# Patient Record
Sex: Female | Born: 1935
Health system: Southern US, Community
[De-identification: ages and names within clinical notes are randomized; demographics above are authoritative.]

## PROBLEM LIST (undated history)

## (undated) DIAGNOSIS — Y92009 Unspecified place in unspecified non-institutional (private) residence as the place of occurrence of the external cause: Secondary | ICD-10-CM

## (undated) DIAGNOSIS — I219 Acute myocardial infarction, unspecified: Secondary | ICD-10-CM

## (undated) DIAGNOSIS — I10 Essential (primary) hypertension: Secondary | ICD-10-CM

## (undated) DIAGNOSIS — W19XXXA Unspecified fall, initial encounter: Secondary | ICD-10-CM

## (undated) DIAGNOSIS — F329 Major depressive disorder, single episode, unspecified: Secondary | ICD-10-CM

## (undated) DIAGNOSIS — F32A Depression, unspecified: Secondary | ICD-10-CM

## (undated) HISTORY — DX: Major depressive disorder, single episode, unspecified: F32.9

## (undated) HISTORY — PX: ELECTROCONVULSIVE THERAPY: SHX1495

## (undated) HISTORY — DX: Depression, unspecified: F32.A

## (undated) HISTORY — DX: Acute myocardial infarction, unspecified: I21.9

## (undated) HISTORY — DX: Essential (primary) hypertension: I10

## (undated) HISTORY — PX: BACK SURGERY: SHX140

---

## 1985-03-19 DIAGNOSIS — I219 Acute myocardial infarction, unspecified: Secondary | ICD-10-CM

## 1985-03-19 HISTORY — DX: Acute myocardial infarction, unspecified: I21.9

## 1988-09-16 HISTORY — PX: BLADDER SURGERY: SHX569

## 1993-03-19 HISTORY — PX: ABDOMINAL HYSTERECTOMY: SHX81

## 1997-04-19 ENCOUNTER — Encounter (INDEPENDENT_AMBULATORY_CARE_PROVIDER_SITE_OTHER): Payer: Self-pay | Admitting: Internal Medicine

## 1997-04-19 LAB — CONVERTED CEMR LAB

## 1999-08-02 ENCOUNTER — Encounter: Admission: RE | Admit: 1999-08-02 | Discharge: 1999-08-02 | Payer: Self-pay | Admitting: Family Medicine

## 1999-08-02 ENCOUNTER — Encounter: Payer: Self-pay | Admitting: Family Medicine

## 2000-08-02 ENCOUNTER — Encounter: Admission: RE | Admit: 2000-08-02 | Discharge: 2000-08-02 | Payer: Self-pay | Admitting: Family Medicine

## 2000-08-02 ENCOUNTER — Encounter: Payer: Self-pay | Admitting: Family Medicine

## 2001-02-19 ENCOUNTER — Encounter: Payer: Self-pay | Admitting: Orthopedic Surgery

## 2001-02-19 ENCOUNTER — Encounter: Admission: RE | Admit: 2001-02-19 | Discharge: 2001-02-19 | Payer: Self-pay | Admitting: Orthopedic Surgery

## 2001-06-22 ENCOUNTER — Emergency Department (HOSPITAL_COMMUNITY): Admission: EM | Admit: 2001-06-22 | Discharge: 2001-06-22 | Payer: Self-pay

## 2001-08-06 ENCOUNTER — Encounter: Payer: Self-pay | Admitting: Family Medicine

## 2001-08-06 ENCOUNTER — Encounter: Admission: RE | Admit: 2001-08-06 | Discharge: 2001-08-06 | Payer: Self-pay | Admitting: Family Medicine

## 2001-08-08 ENCOUNTER — Encounter: Admission: RE | Admit: 2001-08-08 | Discharge: 2001-08-08 | Payer: Self-pay | Admitting: Family Medicine

## 2001-08-08 ENCOUNTER — Encounter: Payer: Self-pay | Admitting: Family Medicine

## 2001-12-11 ENCOUNTER — Encounter: Payer: Self-pay | Admitting: Neurological Surgery

## 2001-12-11 ENCOUNTER — Encounter: Admission: RE | Admit: 2001-12-11 | Discharge: 2001-12-11 | Payer: Self-pay | Admitting: Neurological Surgery

## 2002-01-06 ENCOUNTER — Ambulatory Visit (HOSPITAL_COMMUNITY): Admission: RE | Admit: 2002-01-06 | Discharge: 2002-01-06 | Payer: Self-pay | Admitting: Neurological Surgery

## 2002-04-13 ENCOUNTER — Inpatient Hospital Stay (HOSPITAL_COMMUNITY): Admission: RE | Admit: 2002-04-13 | Discharge: 2002-04-18 | Payer: Self-pay | Admitting: Neurological Surgery

## 2002-04-13 ENCOUNTER — Encounter: Payer: Self-pay | Admitting: Neurological Surgery

## 2002-08-10 ENCOUNTER — Encounter: Payer: Self-pay | Admitting: Family Medicine

## 2002-08-10 ENCOUNTER — Encounter: Admission: RE | Admit: 2002-08-10 | Discharge: 2002-08-10 | Payer: Self-pay | Admitting: Family Medicine

## 2002-09-12 ENCOUNTER — Emergency Department (HOSPITAL_COMMUNITY): Admission: EM | Admit: 2002-09-12 | Discharge: 2002-09-12 | Payer: Self-pay

## 2002-09-15 ENCOUNTER — Encounter: Payer: Self-pay | Admitting: Emergency Medicine

## 2002-09-15 ENCOUNTER — Inpatient Hospital Stay (HOSPITAL_COMMUNITY): Admission: EM | Admit: 2002-09-15 | Discharge: 2002-09-16 | Payer: Self-pay | Admitting: Emergency Medicine

## 2002-09-18 ENCOUNTER — Encounter: Admission: RE | Admit: 2002-09-18 | Discharge: 2002-09-18 | Payer: Self-pay | Admitting: Family Medicine

## 2002-09-18 ENCOUNTER — Encounter: Payer: Self-pay | Admitting: Family Medicine

## 2002-10-10 ENCOUNTER — Emergency Department (HOSPITAL_COMMUNITY): Admission: EM | Admit: 2002-10-10 | Discharge: 2002-10-10 | Payer: Self-pay

## 2002-11-16 ENCOUNTER — Inpatient Hospital Stay (HOSPITAL_COMMUNITY): Admission: EM | Admit: 2002-11-16 | Discharge: 2002-11-22 | Payer: Self-pay | Admitting: Psychiatry

## 2003-04-23 ENCOUNTER — Encounter: Admission: RE | Admit: 2003-04-23 | Discharge: 2003-04-23 | Payer: Self-pay | Admitting: Neurological Surgery

## 2003-05-09 ENCOUNTER — Inpatient Hospital Stay (HOSPITAL_COMMUNITY): Admission: EM | Admit: 2003-05-09 | Discharge: 2003-05-20 | Payer: Self-pay | Admitting: Psychiatry

## 2003-05-21 ENCOUNTER — Other Ambulatory Visit (HOSPITAL_COMMUNITY): Admission: RE | Admit: 2003-05-21 | Discharge: 2003-05-26 | Payer: Self-pay | Admitting: Psychiatry

## 2003-08-29 ENCOUNTER — Encounter: Admission: RE | Admit: 2003-08-29 | Discharge: 2003-08-29 | Payer: Self-pay | Admitting: Neurological Surgery

## 2003-09-23 ENCOUNTER — Ambulatory Visit (HOSPITAL_COMMUNITY): Admission: RE | Admit: 2003-09-23 | Discharge: 2003-09-23 | Payer: Self-pay | Admitting: Neurological Surgery

## 2004-05-01 ENCOUNTER — Encounter: Admission: RE | Admit: 2004-05-01 | Discharge: 2004-05-01 | Payer: Self-pay | Admitting: Family Medicine

## 2004-05-11 ENCOUNTER — Encounter: Admission: RE | Admit: 2004-05-11 | Discharge: 2004-05-11 | Payer: Self-pay | Admitting: Family Medicine

## 2004-06-09 ENCOUNTER — Ambulatory Visit: Payer: Self-pay | Admitting: Family Medicine

## 2004-06-16 ENCOUNTER — Ambulatory Visit: Payer: Self-pay | Admitting: Family Medicine

## 2004-10-06 ENCOUNTER — Ambulatory Visit: Payer: Self-pay | Admitting: Family Medicine

## 2005-06-29 ENCOUNTER — Encounter: Admission: RE | Admit: 2005-06-29 | Discharge: 2005-06-29 | Payer: Self-pay | Admitting: Family Medicine

## 2005-12-11 ENCOUNTER — Ambulatory Visit: Payer: Self-pay | Admitting: Internal Medicine

## 2006-02-13 ENCOUNTER — Ambulatory Visit: Payer: Self-pay | Admitting: Internal Medicine

## 2006-03-13 ENCOUNTER — Ambulatory Visit: Payer: Self-pay | Admitting: Internal Medicine

## 2006-04-29 ENCOUNTER — Ambulatory Visit: Payer: Self-pay | Admitting: Internal Medicine

## 2006-06-17 ENCOUNTER — Ambulatory Visit: Payer: Self-pay | Admitting: Family Medicine

## 2006-06-17 LAB — CONVERTED CEMR LAB
BUN: 15 mg/dL (ref 6–23)
GFR calc Af Amer: 70 mL/min
Potassium: 4.3 meq/L (ref 3.5–5.1)

## 2006-08-29 ENCOUNTER — Telehealth (INDEPENDENT_AMBULATORY_CARE_PROVIDER_SITE_OTHER): Payer: Self-pay | Admitting: Internal Medicine

## 2006-09-19 ENCOUNTER — Telehealth: Payer: Self-pay | Admitting: Family Medicine

## 2007-04-03 ENCOUNTER — Ambulatory Visit: Payer: Self-pay | Admitting: Family Medicine

## 2007-04-03 DIAGNOSIS — R269 Unspecified abnormalities of gait and mobility: Secondary | ICD-10-CM

## 2007-04-03 DIAGNOSIS — E876 Hypokalemia: Secondary | ICD-10-CM | POA: Insufficient documentation

## 2007-04-08 LAB — CONVERTED CEMR LAB
BUN: 16 mg/dL (ref 6–23)
Calcium: 9.5 mg/dL (ref 8.4–10.5)
GFR calc Af Amer: 57 mL/min
GFR calc non Af Amer: 47 mL/min

## 2007-08-05 ENCOUNTER — Ambulatory Visit: Payer: Self-pay | Admitting: Family Medicine

## 2007-08-05 DIAGNOSIS — G4700 Insomnia, unspecified: Secondary | ICD-10-CM | POA: Insufficient documentation

## 2008-03-17 ENCOUNTER — Telehealth (INDEPENDENT_AMBULATORY_CARE_PROVIDER_SITE_OTHER): Payer: Self-pay | Admitting: Internal Medicine

## 2008-05-24 ENCOUNTER — Telehealth (INDEPENDENT_AMBULATORY_CARE_PROVIDER_SITE_OTHER): Payer: Self-pay | Admitting: Internal Medicine

## 2008-10-01 ENCOUNTER — Encounter (INDEPENDENT_AMBULATORY_CARE_PROVIDER_SITE_OTHER): Payer: Self-pay | Admitting: Internal Medicine

## 2008-10-01 DIAGNOSIS — I252 Old myocardial infarction: Secondary | ICD-10-CM

## 2008-10-01 DIAGNOSIS — I10 Essential (primary) hypertension: Secondary | ICD-10-CM

## 2008-10-01 DIAGNOSIS — F329 Major depressive disorder, single episode, unspecified: Secondary | ICD-10-CM

## 2008-10-01 DIAGNOSIS — G20 Parkinson's disease: Secondary | ICD-10-CM

## 2008-10-06 ENCOUNTER — Ambulatory Visit: Payer: Self-pay | Admitting: Family Medicine

## 2008-10-07 LAB — CONVERTED CEMR LAB
BUN: 18 mg/dL (ref 6–23)
Eosinophils Relative: 1.6 % (ref 0.0–5.0)
GFR calc non Af Amer: 57.42 mL/min (ref >60)
HCT: 49 % — ABNORMAL HIGH (ref 36.0–46.0)
Hemoglobin: 16.7 g/dL — ABNORMAL HIGH (ref 12.0–15.0)
Lymphs Abs: 1.7 10*3/uL (ref 0.7–4.0)
Monocytes Relative: 7.1 % (ref 3.0–12.0)
Platelets: 149 10*3/uL — ABNORMAL LOW (ref 150.0–400.0)
Potassium: 4.1 meq/L (ref 3.5–5.1)
Sodium: 146 meq/L — ABNORMAL HIGH (ref 135–145)
TSH: 2.59 microintl units/mL (ref 0.35–5.50)
WBC: 6.2 10*3/uL (ref 4.5–10.5)

## 2008-10-08 ENCOUNTER — Telehealth (INDEPENDENT_AMBULATORY_CARE_PROVIDER_SITE_OTHER): Payer: Self-pay | Admitting: Internal Medicine

## 2008-10-18 ENCOUNTER — Encounter: Payer: Self-pay | Admitting: Family Medicine

## 2008-10-21 ENCOUNTER — Encounter: Payer: Self-pay | Admitting: Family Medicine

## 2008-11-02 ENCOUNTER — Encounter: Payer: Self-pay | Admitting: Family Medicine

## 2008-11-18 ENCOUNTER — Encounter (INDEPENDENT_AMBULATORY_CARE_PROVIDER_SITE_OTHER): Payer: Self-pay | Admitting: Internal Medicine

## 2008-12-08 ENCOUNTER — Encounter (INDEPENDENT_AMBULATORY_CARE_PROVIDER_SITE_OTHER): Payer: Self-pay | Admitting: Internal Medicine

## 2008-12-15 ENCOUNTER — Encounter: Payer: Self-pay | Admitting: Family Medicine

## 2009-01-18 ENCOUNTER — Encounter: Payer: Self-pay | Admitting: Family Medicine

## 2009-01-27 ENCOUNTER — Encounter (INDEPENDENT_AMBULATORY_CARE_PROVIDER_SITE_OTHER): Payer: Self-pay | Admitting: Internal Medicine

## 2009-02-17 ENCOUNTER — Encounter (INDEPENDENT_AMBULATORY_CARE_PROVIDER_SITE_OTHER): Payer: Self-pay | Admitting: Internal Medicine

## 2009-02-21 ENCOUNTER — Encounter: Payer: Self-pay | Admitting: Family Medicine

## 2009-03-07 ENCOUNTER — Encounter: Payer: Self-pay | Admitting: Family Medicine

## 2009-04-26 ENCOUNTER — Encounter: Payer: Self-pay | Admitting: Family Medicine

## 2009-04-28 ENCOUNTER — Encounter: Payer: Self-pay | Admitting: Family Medicine

## 2009-06-17 ENCOUNTER — Encounter: Payer: Self-pay | Admitting: Family Medicine

## 2009-12-05 ENCOUNTER — Telehealth: Payer: Self-pay | Admitting: Family Medicine

## 2010-02-23 ENCOUNTER — Ambulatory Visit: Payer: Self-pay | Admitting: Family Medicine

## 2010-02-23 DIAGNOSIS — D649 Anemia, unspecified: Secondary | ICD-10-CM | POA: Insufficient documentation

## 2010-02-24 LAB — CONVERTED CEMR LAB
Basophils Relative: 0.6 % (ref 0.0–3.0)
Direct LDL: 133.3 mg/dL
Eosinophils Relative: 2.2 % (ref 0.0–5.0)
GFR calc non Af Amer: 67.18 mL/min (ref >60.00)
Glucose, Bld: 98 mg/dL (ref 70–99)
HCT: 46.4 % — ABNORMAL HIGH (ref 36.0–46.0)
HDL: 61.1 mg/dL (ref >39.00)
Hemoglobin: 15.8 g/dL — ABNORMAL HIGH (ref 12.0–15.0)
Lymphs Abs: 1.8 10*3/uL (ref 0.7–4.0)
Monocytes Relative: 9.7 % (ref 3.0–12.0)
Neutro Abs: 3.2 10*3/uL (ref 1.4–7.7)
Potassium: 4.6 meq/L (ref 3.5–5.1)
RDW: 13.9 % (ref 11.5–14.6)
Sodium: 141 meq/L (ref 135–145)
Total CHOL/HDL Ratio: 4
Triglycerides: 92 mg/dL (ref 0.0–149.0)
VLDL: 18.4 mg/dL (ref 0.0–40.0)
WBC: 5.7 10*3/uL (ref 4.5–10.5)

## 2010-04-04 ENCOUNTER — Ambulatory Visit: Admit: 2010-04-04 | Payer: Self-pay | Admitting: Family Medicine

## 2010-04-08 ENCOUNTER — Encounter: Payer: Self-pay | Admitting: Neurological Surgery

## 2010-04-11 ENCOUNTER — Ambulatory Visit
Admission: RE | Admit: 2010-04-11 | Discharge: 2010-04-11 | Payer: Self-pay | Source: Home / Self Care | Attending: Family Medicine | Admitting: Family Medicine

## 2010-04-11 ENCOUNTER — Encounter: Payer: Self-pay | Admitting: Family Medicine

## 2010-04-11 ENCOUNTER — Other Ambulatory Visit: Payer: Self-pay | Admitting: Family Medicine

## 2010-04-11 DIAGNOSIS — H6120 Impacted cerumen, unspecified ear: Secondary | ICD-10-CM | POA: Insufficient documentation

## 2010-04-11 DIAGNOSIS — Z1239 Encounter for other screening for malignant neoplasm of breast: Secondary | ICD-10-CM

## 2010-04-11 DIAGNOSIS — H9190 Unspecified hearing loss, unspecified ear: Secondary | ICD-10-CM | POA: Insufficient documentation

## 2010-04-18 NOTE — Miscellaneous (Signed)
Summary: [facility] Episode Summary Report  [facility] Episode Summary Report   Imported By: Beau Fanny 06/21/2009 14:45:27  _____________________________________________________________________  External Attachment:    Type:   Image     Comment:   External Document

## 2010-04-18 NOTE — Miscellaneous (Signed)
Summary: [facility] Physical Therapy Order  [facility] Physical Therapy Order   Imported By: Beau Fanny 04/29/2009 10:43:18  _____________________________________________________________________  External Attachment:    Type:   Image     Comment:   External Document

## 2010-04-18 NOTE — Assessment & Plan Note (Signed)
Summary: TRANSFER FROM BILLIE/REFILL MEDS/CLE   Vital Signs:  Patient profile:   77 year old female Temp:     98.7 degrees F oral Pulse rate:   68 / minute Pulse rhythm:   regular BP sitting:   130 / 90  (left arm) Cuff size:   regular  Vitals Entered By: Linde Gillis CMA Duncan Dull) (February 23, 2010 8:58 AM) CC: establish care from [facility] Comments patient didn't feel like weighing   History of Present Illness: 67 you pt new to me here for med refills.   Here with her husband and daughter.    Still doing PT at home.  Dr. Golden Hurter is her neurologist of her Parkinson's.  Requesting higher dose of amitriptyline because it helps her sleep better at night.    HTN- on Atenolol 25 mg two times a day.  No CP, blurred vision, HA or LE edema.  No dizziness with changing positions.  h/o anemia- per pt's family, followed by hematology?  Fatigue is stable.  No SOB or CP. No hematology notes in emr.  Current Medications (verified): 1)  Atenolol 50 Mg Tabs (Atenolol) .... Take 1/2 Tablet By Mouth Twice A Day 2)  Bl Stool Softener 100 Mg  Caps (Docusate Sodium) .... As Needed 3)  Klor-Con M20 20 Meq Cr-Tabs (Potassium Chloride Crys Cr) .... Take One By Mouth Two Times A Day 4)  Multivitamins   Tabs (Multiple Vitamin) .... One Daily 5)  Fish Oil   Oil (Fish Oil) .... One Daily 6)  Amitriptyline Hcl 25 Mg Tabs (Amitriptyline Hcl) .Marland Kitchen.. 1 -2 Tab By Mouth At Bedtime As Needed.  Allergies: 1)  ! * Many Sensitivites  Past History:  Past Medical History: Last updated: 10/01/2008 Depression Hypertension Myocardial infarction, hx of in 1987  Past Surgical History: Last updated: 10/06/2008 back surgery --2003 and 01/1989 tltal hysterectomy--1995 bladder tack up--09/1988  Family History: Last updated: 10/01/2008 Father: MI at age 23 Mother: Angina and MI age 103 and died. Siblings:One sibling - brain tumor                One sibling - Diabetes and hx of MI  Social History: Last updated:  10/06/2008 Marital Status: Married Children:  Occupation:   Risk Factors: Alcohol Use: 0 (10/06/2008) Caffeine Use: 1 (10/06/2008) Exercise: no (10/06/2008)  Risk Factors: Smoking Status: never (10/06/2008)  Review of Systems      See HPI General:  Complains of fatigue. Eyes:  Denies blurring. ENT:  Denies difficulty swallowing. CV:  Denies chest pain or discomfort. Resp:  Denies shortness of breath. GI:  Denies abdominal pain. Derm:  Denies rash. Psych:  Denies anxiety and depression.  Physical Exam  General:  alert, well-developed, well-nourished, and well-hydrated.  NAD sitting quietly in wheel chair Head:  normocephalic, atraumatic, and no abnormalities observed.   Eyes:  pupils equal, pupils round, and no injection.   Ears:  R ear normal and L ear normal.   Nose:  no external deformity.   Mouth:  no gingival abnormalities.   Lungs:  normal respiratory effort, no intercostal retractions, no accessory muscle use, and normal breath sounds.   Heart:  occasional extra beat--no pattern noted, no murmur heard Abdomen:  soft, non-tender, normal bowel sounds, and no guarding.   Msk:  able to raise both arms above her head slowly  Neurologic:  alert & oriented X3,-tremors in both hands at rest, less so with intention Skin:  color normal and no rashes.   Psych:  normally interactive, flat affect, and subdued.     Impression & Recommendations:  Problem # 1:  HYPERTENSION (ICD-401.9) Assessment Unchanged Stable, will check BMET today. Refilled Atenolol. Her updated medication list for this problem includes:    Atenolol 50 Mg Tabs (Atenolol) .Marland Kitchen... Take 1/2 tablet by mouth twice a day  Orders: Venipuncture (82956)  Problem # 2:  PARKINSON'S DISEASE (ICD-332.0) Assessment: Unchanged per neurology.  Problem # 3:  UNSPECIFIED ANEMIA (ICD-285.9) Assessment: Unchanged no hematology notes in emr. Will check CBC, B12, folate. Orders: TLB-CBC Platelet - w/Differential  (85025-CBCD) TLB-B12 + Folate Pnl ([id number]-O96/EXB) Prescription Created Electronically [phone])  Problem # 4:  INSOMNIA (ICD-780.52) Assessment: Deteriorated Increased dose of amitriptyline.    Complete Medication List: 1)  Atenolol 50 Mg Tabs (Atenolol) .... Take 1/2 tablet by mouth twice a day 2)  Bl Stool Softener 100 Mg Caps (Docusate sodium) .... As needed 3)  Klor-con M20 20 Meq Cr-tabs (Potassium chloride crys cr) .... Take one by mouth two times a day 4)  Multivitamins Tabs (Multiple vitamin) .... One daily 5)  Fish Oil Oil (Fish oil) .... One daily 6)  Amitriptyline Hcl 25 Mg Tabs (Amitriptyline hcl) .Marland Kitchen.. 1 -2 tab by mouth at bedtime as needed.  Other Orders: TLB-BMP (Basic Metabolic Panel-BMET) (80048-METABOL) TLB-Lipid Panel (80061-LIPID) Prescriptions: AMITRIPTYLINE HCL 25 MG TABS (AMITRIPTYLINE HCL) 1 -2 tab by mouth at bedtime as needed.  #60 x 0   Entered and Authorized by:   [name] MD   Signed by:   [name] MD on 02/23/2010   Method used:   [facility] [address] [facility]       [address]       [place], [place]  [address]       Ph: [phone]       Fax: [phone]   RxID:   [id number] AMITRIPTYLINE HCL 10 MG TABS (AMITRIPTYLINE HCL) take 2 at bedtime as needed  #60 x 3   Entered and Authorized by:   [name] MD   Signed by:   [name] MD on 02/23/2010   Method used:   [facility]        [facility]  [address] [facility]       [address]       [place], [place]  [address]       Ph: [phone]       Fax: [phone]   RxID:   [id number] KLOR-CON M20 20 MEQ CR-TABS (POTASSIUM CHLORIDE CRYS CR) Take one by mouth two times a day  #60 x 5   Entered and Authorized by:   [name] MD   Signed by:   [name] MD on 02/23/2010   Method used:   [facility] [facility]       [address]       [place], [place]  [address]       Ph: [phone]       Fax: [phone]   RxID:   [id number] ATENOLOL 50 MG TABS (ATENOLOL) Take 1/2 tablet by mouth twice a day  #60 x 4   Entered and Authorized by:   [name] MD   Signed by:   Jovita Gamma  Dayton Martes MD on 02/23/2010   Method used:   [facility]        [facility] [address] [facility]       [address]       [place], [place]  [address]       Ph: [phone]       Fax: [phone]   RxID:   [id number]    Orders Added: 1)  Venipuncture [73220] 2)  TLB-BMP (Basic Metabolic Panel-BMET) [80048-METABOL] 3)  TLB-CBC Platelet - w/Differential [85025-CBCD] 4)  TLB-B12 + Folate Pnl [82746_82607-B12/FOL] 5)  TLB-Lipid Panel [80061-LIPID] 6)  Prescription Created Electronically [G8553] 7)  Est. Patient Level IV [25427]    Current Allergies (reviewed today): ! * MANY SENSITIVITES

## 2010-04-18 NOTE — Miscellaneous (Signed)
Summary: [facility] Episode Summary Report  [facility] Episode Summary Report   Imported By: Beau Fanny 04/29/2009 10:46:20  _____________________________________________________________________  External Attachment:    Type:   Image     Comment:   External Document

## 2010-04-18 NOTE — Progress Notes (Signed)
Summary: klor-con  Phone Note Refill Request Message from:  Scriptline on December 05, 2009 10:35 AM  Refills Requested: Medication #1:  KLOR-CON M20 20 MEQ CR-TABS Take one by mouth two times a day   Supply Requested: 1 month Patient not seen since 11-2008   Method Requested: Electronic Initial call taken by: Benny Lennert CMA Duncan Dull),  December 05, 2009 10:35 AM  Follow-up for Phone Call        px written on EMR for call in set her up with new doctor this fall please - used to see Willaim Sheng and Nadine Counts Follow-up by: Judith Part MD,  December 05, 2009 10:57 AM  Additional Follow-up for Phone Call Additional follow up Details #1::        Patient is changing to [facility].Consuello Masse CMA   Additional Follow-up by: Benny Lennert CMA (AAMA),  December 05, 2009 11:01 AM    New/Updated Medications: KLOR-CON M20 20 MEQ CR-TABS (POTASSIUM CHLORIDE CRYS CR) Take one by mouth two times a day Prescriptions: KLOR-CON M20 20 MEQ CR-TABS (POTASSIUM CHLORIDE CRYS CR) Take one by mouth two times a day  #60 x 5   Entered by:   Benny Lennert CMA (AAMA)   Authorized by:   Judith Part MD   Signed by:   Benny Lennert CMA (AAMA) on 12/05/2009   Method used:   [facility]        [facility] [facility] [address] [facility]       [address]       [place], [place]  [address]       Ph: [phone]       Fax: [phone]   RxID:   [id number]

## 2010-04-18 NOTE — Miscellaneous (Signed)
Summary: [facility] Plan of Care  [facility] Plan of Care   Imported By: Beau Fanny 04/29/2009 10:41:58  _____________________________________________________________________  External Attachment:    Type:   Image     Comment:   External Document

## 2010-04-20 NOTE — Letter (Signed)
Summary: Nature conservation officer [employer] Wellness Visit Questionnaire   [employer] Annual Wellness Visit Questionnaire   Imported By: Beau Fanny 04/11/2010 11:20:51  _____________________________________________________________________  External Attachment:    Type:   Image     Comment:   External Document

## 2010-04-20 NOTE — Assessment & Plan Note (Signed)
Summary: MEDCARE WELLNESS CPX/RBH   Vital Signs:  Patient profile:   77 year old female Height:      64 inches Weight:      131.50 pounds BMI:     22.65 Temp:     97.9 degrees F oral Pulse rate:   60 / minute Pulse rhythm:   regular BP sitting:   140 / 90  (left arm) Cuff size:   regular  Vitals Entered By: Linde Gillis CMA Duncan Dull) (April 11, 2010 9:57 AM) CC: medicare wellness physicial  Vision Screening:      Vision Comments: patient could not read any line completely  Vision Entered By: Linde Gillis CMA Duncan Dull) (April 11, 2010 10:06 AM)  Hearing Screen  20db HL: Left  Right  Audiometry Comment: patient could not hear at any noise level   Hearing Testing Entered By: Linde Gillis CMA Duncan Dull) (April 11, 2010 10:06 AM)   History of Present Illness: I have personally reviewed the Medicare Annual Wellness questionnaire and have noted 1.   The patient's medical and social history 2.   Their use of alcohol, tobacco or illicit drugs- denies any drug, tobacco or ETOH use. 3.   Their current medications and supplements 4.   The patient's functional ability including ADL's, fall risks, home safety risks and hearing or visual impairment - pt failed hearing screen, removed cerumen but she declined audiology referral. 5.   Diet and physical activities- assisted or dependent for all ADLs.  See geriatrics assessment. 6.   Evidence for depression or mood disorders- none, pt denies any symptoms of depression.    Current Medications (verified): 1)  Atenolol 50 Mg Tabs (Atenolol) .... Take 1/2 Tablet By Mouth Twice A Day 2)  Miralax  Powd (Polyethylene Glycol 3350) .... Two Teaspoons Daily 3)  Klor-Con M20 20 Meq Cr-Tabs (Potassium Chloride Crys Cr) .... Take One By Mouth Two Times A Day 4)  Multivitamins   Tabs (Multiple Vitamin) .... One Daily 5)  Fish Oil   Oil (Fish Oil) .... One Daily 6)  Amitriptyline Hcl 25 Mg Tabs (Amitriptyline Hcl) .Marland Kitchen.. 1 -2 Tab By Mouth At Bedtime As  Needed.  Allergies: 1)  ! * Many Sensitivites  Past History:  Past Medical History: Last updated: 10/01/2008 Depression Hypertension Myocardial infarction, hx of in 1987  Past Surgical History: Last updated: 10/06/2008 back surgery --2003 and 01/1989 tltal hysterectomy--1995 bladder tack up--09/1988  Family History: Last updated: 10/01/2008 Father: MI at age 54 Mother: Angina and MI age 44 and died. Siblings:One sibling - brain tumor                One sibling - Diabetes and hx of MI  Social History: Last updated: 10/06/2008 Marital Status: Married Children:  Occupation:   Risk Factors: Alcohol Use: 0 (10/06/2008) Caffeine Use: 1 (10/06/2008) Exercise: no (10/06/2008)  Risk Factors: Smoking Status: never (10/06/2008)  Review of Systems      See HPI General:  Denies malaise. Eyes:  Denies blurring. CV:  Denies chest pain or discomfort. Resp:  Denies shortness of breath.  Physical Exam  General:  alert, well-developed, well-nourished, and well-hydrated.  NAD sitting quietly in wheel chair Ears:  cerumen impaction bilaterally, removed with curette and irrigation. Mouth:  no gingival abnormalities.   Psych:  normally interactive, flat affect, and subdued.     Impression & Recommendations:  Problem # 1:  Preventive Health Care (ICD-V70.0) The patients weight, height, BMI and visual acuity have been recorded in the chart  I have made referrals, counseling and provided education to the patient based review of the above and I have provided the pt with a written personalized care plan for preventive services.  Problem # 2:  CERUMEN IMPACTION, BILATERAL (ICD-380.4) Assessment: New  cerumen removed, pt tolerated procedure well.  Orders: Cerumen Impaction Removal (60454)  Complete Medication List: 1)  Atenolol 50 Mg Tabs (Atenolol) .... Take 1/2 tablet by mouth twice a day 2)  Miralax Powd (Polyethylene glycol 3350) .... Two teaspoons daily 3)  Klor-con M20  20 Meq Cr-tabs (Potassium chloride crys cr) .... Take one by mouth two times a day 4)  Multivitamins Tabs (Multiple vitamin) .... One daily 5)  Fish Oil Oil (Fish oil) .... One daily 6)  Amitriptyline Hcl 25 Mg Tabs (Amitriptyline hcl) .Marland Kitchen.. 1 -2 tab by mouth at bedtime as needed.  Other Orders: Radiology Referral (Radiology) Prescription Created Electronically [phone]) Medicare -1st Annual Wellness Visit [phone])  Patient Instructions: 1)  I have provided you with a copy of your personalized plan for preventive services. Please take the time to review along with your updated medication list. 2)  Please see Shirlee Limerick Prescriptions: AMITRIPTYLINE HCL 25 MG TABS (AMITRIPTYLINE HCL) 1 -2 tab by mouth at bedtime as needed.  #60 x 0   Entered and Authorized by:   [name] MD   Signed by:   [name] MD on 04/11/2010   Method used:   [facility] [facility]       [address]       [place], [place]  [address]       Ph: [phone]       Fax: [phone]   RxID:   [id number]    Orders Added: 1)  Radiology Referral [Radiology] 2)  Prescription Created Electronically [phone] 3)  Medicare -1st Annual Wellness Visit [G0438] 4)  Cerumen Impaction Removal [69210]    Current Allergies (reviewed today): ! * MANY SENSITIVITES  Flu Vaccine Next Due:  Refused TD Next Due:  Refused Pneumovax Next Due:  Refused Herpes Zoster Next Due:  Refused LDL Result Date:  03/02/2010 LDL Result:  133 Colonoscopy Next Due:  Refused Last PAP:  Done (04/19/1997 11:51:58 AM) PAP Next Due:  Not Indicated  Prevention & Chronic Care Immunizations   Influenza vaccine: Not documented   Influenza vaccine deferral: Refused  (04/11/2010)   Influenza vaccine due: Refused  (04/11/2010)    Tetanus booster: Not documented   Td booster deferral: Refused  (04/11/2010)   Tetanus booster due: Refused  (04/11/2010)    Pneumococcal vaccine: Not  documented   Pneumococcal vaccine deferral: Refused  (04/11/2010)   Pneumococcal vaccine due: Refused  (04/11/2010)    H. zoster vaccine: Not documented   H. zoster vaccine deferral: Refused  (04/11/2010)  Colorectal Screening   Hemoccult: Done  (06/17/2000)   Hemoccult action/deferral: Ordered  (04/11/2010)   Hemoccult due: 06/17/2001    Colonoscopy: Not documented   Colonoscopy action/deferral: Refused  (04/11/2010)   Colonoscopy due: Refused  (04/11/2010)  Other Screening   Pap smear: Done  (04/19/1997)   Pap smear due: Not Indicated    Mammogram: Normal  (04/19/2004)   Mammogram action/deferral: Ordered  (04/11/2010)   Mammogram due: 04/19/2005    DXA bone density scan: Not documented   Smoking status: never  (10/06/2008)  Lipids   Total Cholesterol: 223  (02/23/2010)   LDL:  133  (03/08/2010)   LDL Direct: 133.3  (02/23/2010)   HDL: 61.10  (02/23/2010)   Triglycerides: 92.0  (02/23/2010)  Hypertension   Last Blood Pressure: 140 / 90  (04/11/2010)   Serum creatinine: 0.9  (02/23/2010)   Serum potassium 4.6  (02/23/2010)  Self-Management Support :    Hypertension self-management support: Not documented   Nursing Instructions: Schedule screening mammogram (see order)     Geriatric Assessment:  Activities of Daily Living:    Bathing-assisted    Dressing-assisted    Eating-independent    Toileting-assisted    Transferring-assisted    Continence-independent  Instrumental Activities of Daily Living:    Transportation-dependent    Meal/Food Preparation-dependent    Shopping Errands-dependent    Housekeeping/Chores-dependent    Money Management/Finances-dependent    Medication Management-dependent    Ability to Use Telephone-dependent    Laundry-dependent

## 2010-04-20 NOTE — Letter (Signed)
Summary: [facility]: Immunoassay Fecal Occult Blood (iFOB) Order Form  [place] at [facility]  [address] [place], [place] [address]   Phone: [phone]  Fax: [phone]      [facility]: Immunoassay Fecal Occult Blood (iFOB) Order Form   April 11, 2010 MRN: [id number]   [facility] 03-08-1932   Physicican Name:______Talia Aron___________________  Diagnosis Code:________V76.51__________________      [name] MD

## 2010-04-25 ENCOUNTER — Other Ambulatory Visit: Payer: Medicare Other

## 2010-04-25 ENCOUNTER — Encounter (INDEPENDENT_AMBULATORY_CARE_PROVIDER_SITE_OTHER): Payer: Self-pay | Admitting: *Deleted

## 2010-04-25 ENCOUNTER — Other Ambulatory Visit: Payer: Self-pay | Admitting: Family Medicine

## 2010-04-25 DIAGNOSIS — Z1211 Encounter for screening for malignant neoplasm of colon: Secondary | ICD-10-CM

## 2010-04-26 ENCOUNTER — Encounter: Payer: Self-pay | Admitting: Family Medicine

## 2010-05-02 ENCOUNTER — Ambulatory Visit
Admission: RE | Admit: 2010-05-02 | Discharge: 2010-05-02 | Disposition: A | Discharge status: Home / Self Care | Payer: Medicare Other | Source: Ambulatory Visit | Attending: Family Medicine | Admitting: Family Medicine

## 2010-05-02 DIAGNOSIS — Z1239 Encounter for other screening for malignant neoplasm of breast: Secondary | ICD-10-CM

## 2010-05-04 ENCOUNTER — Encounter: Payer: Self-pay | Admitting: Family Medicine

## 2010-05-04 NOTE — Letter (Signed)
Summary: Results Follow up Letter  [place] at [facility]  [address] [place], [place] [address]   Phone: [phone]  Fax: [phone]    04/26/2010 MRN: [id number]  [facility] [address] [place], [place]  [address]  [place]  Dear Ms. Linna Darner,  The following are the results of your recent test(s):  Test         Result    Pap Smear:        Normal _____  Not Normal _____ Comments: ______________________________________________________ Cholesterol: LDL(Bad cholesterol):         Your goal is less than:         HDL (Good cholesterol):       Your goal is more than: Comments:  ______________________________________________________ Mammogram:        Normal _____  Not Normal _____ Comments:  ___________________________________________________________________ Hemoccult:        Normal __X___  Not normal _______ Comments:    _____________________________________________________________________ Other Tests:    We routinely do not discuss normal results over the telephone.  If you desire a copy of the results, or you have any questions about this information we can discuss them at your next office visit.   Sincerely,      Dr. [name]

## 2010-05-10 NOTE — Letter (Signed)
Summary: Results Follow up Letter  [place] at [facility]  [address] [place], [place] [address]   Phone: [phone]  Fax: [phone]    05/04/2010 MRN: [id number]  [facility] [address] [place], [place]  [address]  [place]  Dear Ms. Linna Darner,  The following are the results of your recent test(s):  Test         Result    Pap Smear:        Normal _____  Not Normal _____ Comments: ______________________________________________________ Cholesterol: LDL(Bad cholesterol):         Your goal is less than:         HDL (Good cholesterol):       Your goal is more than: Comments:  ______________________________________________________ Mammogram:        Normal __X___  Not Normal _____ Comments:  ___________________________________________________________________ Hemoccult:        Normal _____  Not normal _______ Comments:    _____________________________________________________________________ Other Tests:    We routinely do not discuss normal results over the telephone.  If you desire a copy of the results, or you have any questions about this information we can discuss them at your next office visit.   Sincerely,      Dr. [name]

## 2010-08-04 NOTE — Discharge Summary (Signed)
NAME:  Mallory Perez, Mallory Perez NO.:  [id number]   MEDICAL RECORD NO.:  [id number]                   PATIENT TYPE:  INP   LOCATION:  3006                                 FACILITY:  [facility]   PHYSICIAN:  Rene Paci, M.D. [facility]          DATE OF BIRTH:  September 02, 1931   DATE OF ADMISSION:  09/15/2002  DATE OF DISCHARGE:  09/16/2002                                 DISCHARGE SUMMARY   DISCHARGE DIAGNOSES:  1. Generalized weakness.  2. Nausea.  3. Benign essential tremor.   HISTORY OF PRESENT ILLNESS:  Mallory Perez is a 77 year old white female who  has been seen in the emergency room two times this week for weakness and  nausea.  She describes generalized weakness for the past 10 days.  She was  seen by her primary physician on the day prior to this admission complaining  of that her whole body felt weak.  She was scheduled for an MRI on September 15, 2002.  Of interest, the patient walked one mile on a treadmill yesterday.   PAST MEDICAL HISTORY:  1. Hypertension.  2. Left retinal hemorrhage in October of 2003.  3. Lumbar spondylosis status post lumbar fusion with history of spinal     stenosis, status post lumbar laminectomy in January of 2004.  4. History of breast biopsy in 1995.  5. Status post hysterectomy in 1995.  6. Status post bladder tack in 1990.   HOSPITAL COURSE:  PROBLEM #1 -  NEUROLOGIC:  The patient presents with  diffuse generalized weakness that is nonspecific.  Her objective examination  was normal.  We did ask for neurology to see the patient who agreed, no  signs of weakness on a formal examination.  In regard to her tremor, they  felt this was a benign essential tremor with no treatment indicated.  They  did order an MRI/MRA of the brain.  We also ordered a sed rate, total CK,  TSH, RPR, folate and B12 all of which were within normal limits.  There was  no blood work to explain her weakness.  MRI of the brain revealed small  vessel disease.   MRA revealed mild stenosis of the left MCA but otherwise  negative.  Neurology did not have any other recommendations to make at this  time.  Although she might benefit from aspirin as well as perhaps Plavix.   PROBLEM #2 -  HYPERTENSION:  Stable.   PROBLEM #3 -  ANXIETY:  We will have her continue her Xanax.   DISCHARGE LABORATORY DATA:  TSH was 2.11.  RPR nonreactive.  Potassium 3.2.  CBC normal.  Serum folate and B12 normal. Sed rate 4.  Total CK was 35.   DISCHARGE MEDICATIONS:  1. Micro-K 600 mg daily.  2. Xanax 0.25 mg one quarter at bedtime as needed.  3. Atenolol 50/25 one quarter daily.  4. NOTE:  We may also add  aspirin and/or Plavix at discharge.   FOLLOW UP:  The patient will follow up with Dr. Milinda Antis in one to two weeks.     Cornell Barman, P.A. [facility]                  Rene Paci, M.D. [facility]    LC/MEDQ  D:  09/16/2002  T:  09/16/2002  Job:  [id number]   cc:   Melvyn Novas, M.D.  [address]  [address]  [place]  [place] [address]  Fax: [phone]   Idamae Schuller A. Milinda Antis, M.D. [facility]    cc:   Melvyn Novas, M.D.  [address]  [address]  [place]  [place] [address]  Fax: [phone]   Idamae Schuller A. Milinda Antis, M.D. [facility]

## 2010-08-04 NOTE — H&P (Signed)
NAME:  Mallory Perez, Mallory Perez NO.:  [id number]   MEDICAL RECORD NO.:  [id number]                   PATIENT TYPE:  INP   LOCATION:  3172                                 FACILITY:  [facility]   PHYSICIAN:  Stefani Dama, M.D.               DATE OF BIRTH:  [birth year]   DATE OF ADMISSION:  04/13/2002  DATE OF DISCHARGE:                                HISTORY & PHYSICAL   ADMISSION DIAGNOSES:  1. Pseudoarthrosis, L4-5.  2. Spondylolisthesis with stenosis, L4-5 and L3-4.  3. Lumbar radiculopathy.   HISTORY OF PRESENT ILLNESS:  The patient is an individual who is 77 years  old and was initially seen back in March by me.  Her problems started back  in 2000, when she felt a pop in her low back.  She thought that there was a  disk that was injured.  In 1/00, MRI showed that she had no surgical  process, but because pain persisted, she ultimately underwent myelogram and  CT scan on 03/04/01.  Films demonstrated that there was a spondylolisthesis  at L4 and L5, L3 and L4 had some modest spondylitic changes and foraminal  stenosis.  CT scan showed a paucity of bone in the lateral recesses,  however, central canal and the neural roots appeared to be free.  L5-S1  appeared to show no evidence of degeneration or significant disease at L5-  S1.  She had previous back surgery by Dr. Simonne Come in 11/90.  The patient  had been seen by Dr. Simonne Come after review of the recent films, and she was  seen by me for another surgical opinion.  I advised at that time that we  should treat her conservatively.  She was sent to Dr. Thyra Breed,  however, having failed extensive efforts at conservative management, and  noting that her functional status had deteriorated to the point where she  could only walk a few steps at a time without incurring significant back  pain and leg pain.  Ultimately in October we had seen her with a new MRI  scan that demonstrated that the spondylolisthesis  seemed to have progressed  somewhat at the L4-5 level.  At that point, we decided to proceed with  surgical decompression and stabilization of both L4-5 and L3-4.  She is now  admitted for that procedure.   PAST MEDICAL HISTORY:  1. Hypertension.  2. She was told that she had some heart damage, but she notes that she never     had a heart attack.   PAST SURGICAL HISTORY:  1. Bladder tacking procedure in 7/90.  2. Low back fusion in 1990.  3. Hysterectomy in 1/95.  4. Breast biopsy in 1995.   ALLERGIES:  She notes allergies to a number of medications, including:  1. BLOOD PRESSURE MEDICATIONS.  2. MUSCLE RELAXERS.  3. ANTI-INFLAMMATORIES.  4. ANTIBIOTICS.  She has significant difficulties  with heart problems among other reactions.  She has been on some non-steroidal anti-inflammatories, including RELAFEN,  ASA, and VOLTAREN which have caused a reaction.  1. She notes that BEXTRA caused her to have a bout of chest pain more     recently.   SOCIAL HISTORY:  She does not drink alcohol, she does not smoke.  Her height  and weight have been stable at 5 feet 3 inches, and 150 pounds.   REVIEW OF SYMPTOMS:  Notable for chest pain, high blood pressure, high  cholesterol, pain on morphine.  She had difficulty with her balance.  She  wears glasses.  She has arm weakness, leg weakness, back pain, leg pain,  neck pain, scoring on a 14 point review sheet in the office today.   PHYSICAL EXAMINATION:  NEUROLOGIC:  She is an alert, oriented, cooperative  individual.  She stands straight and erect without difficulty.  Her motor  strength in the lower extremities is intact in the iliopsoas, the  quadriceps, tibialis anterior, and gastrocnemius.  Deep tendon reflexes are  2+ in the patellae, 1+ in the Achilles, Babinski's are downgoing.  Straight  leg raising is negative to 80 degrees bilaterally.  Patrick's maneuver  reproduces only back pain locally.  Sensation is intact to pin and vibration   in the distal lower extremities.  Cranial nerve examination reveals that her  pupils are 4 mm, briskly reactive to light and accommodation.  Extraocular  movements are full.  The face is symmetric to grimace.  The tongue and uvula  are in the midline.  Sclerae and conjunctivae are clear.  Sensation about  the face is symmetric.  NECK:  No masses, no bruits are heard.  LUNGS:  Clear to auscultation.  HEART:  Regular rate and rhythm, no murmur is heard.  ABDOMEN:  Soft, bowel sounds are positive, no masses are palpable.  EXTREMITIES:  No cyanosis, clubbing, or edema.   IMPRESSION:  The patient has evidence of spondylitic disease with  degenerative spondylolisthesis and progressively worsening lumbar  radiculopathy.  After careful consideration of her options, the patient is  now being taken to the operating room to undergo surgical decompression of  L3-4 and L4-5.  Pseudoarthrosis will be taken down at L4-5, and then she  will have an arthrodesis with pedicle fixation from L3 to L5.                                                Stefani Dama, M.D.    Merla Riches  D:  04/13/2002  T:  04/13/2002  Job:  [id number]

## 2010-08-04 NOTE — Discharge Summary (Signed)
NAME:  Mallory Perez, Mallory Perez NO.:  [id number]   MEDICAL RECORD NO.:  [id number]                   PATIENT TYPE:  IPS   LOCATION:  0300                                 F[facility]ILITY[facility]   PHYSICIAN:  Carolanne Grumbling, M.D.                 DATE OF BIRTH:  11-20-1931   DATE OF ADMISSION:  11/16/2002  DATE OF DISCHARGE:  11/22/2002                                 DISCHARGE SUMMARY   INITIAL ASSESSMENT AND DIAGNOSIS:  Mallory Perez was a 77 year old woman who  was admitted to the hospital  because she reported  that her whole body  collapsed yesterday. She was complaining of overall weakness and she was  afraid of her medications. She presented a list of 37 medications  that she  said she was allergic to or could not take, most of which were psychiatric  medicines. She had a history of anxiety disorder and panic attacks but had  been more and more anxious for the last few  weeks. She denied any  precipitating events. She said she had been unable to sleep at all over the  past week and had gone to the emergency room twice with panic attacks, being  afraid that she might have a heart attack; both workups were negative  medically according to her.   She would try various medications and believed they caused her chest pain,  and consequently she was afraid to take them again. By the time of admission  she was having suicidal thoughts, saying she would do anything to get rid of  her  feelings of anxiety. She is being seen by Dr. Milford Cage as an  outpatient and Dr. Caralyn Guile at [facility].   PHYSICAL EXAMINATION:  Noncontributory.   MENTAL STATUS EXAM:  Mental status at the time of the initial evaluation  revealed  an alert and oriented woman who was very anxious. Her  concentration was good. She was guarded. She had some mild agitation. She  was positive for suicidal ideation, even though she was feeling less  suicidal today than she had  the day prior to  admission.   ADMISSION DIAGNOSES:   AXIS I:  1. Rule out delusional disorder, not otherwise specified.  2. Anxiety disorder, not otherwise specified.   AXIS II:  No diagnosis.   AXIS III:  1. Hypokalemia.  2. Coronary artery disease with a history of  a myocardial infarction.  3. Benign essential tremor.   AXIS IV:  Moderate.   AXIS V:  25/62.   LABORATORY DATA:  All indicated laboratory  examinations were within normal  limits or noncontributory except for potassium which ranged anywhere from  2.7 to 3.0 from the day of admission to the day of discharge.   An EKG was consistent with the possibility of an old infarction.   HOSPITAL COURSE:  While in the hospital Mallory Perez as followed  by the  internal medicine service, particularly watching the potassium. From the  psychiatric standpoint she remained anxious. The biggest  problem was that  she was afraid to take any medications and even small  amounts of a  medication would cause reported  side-effects. She was taking for instance  1/4th of a tablet of a 0.25 mg Xanax; anything more she was afraid would  cause chest pain. She said her whole family  had a history of heart attacks.  Several of her family  members had already died of heart attacks and she was  very afraid of having a heart attack.   There were no other stressors in her  life. She has a good husband,  supportive children, no real problems in her life other  than the anxiety.  Apparently  the anxiety had been there all her life but it had gotten worse  recently because she is needing to take medications as she gets older and  she took many fewer medications when she was younger.   Out of desperation she eventually did take some Trazodone for sleep and some  clonazepam for her anxiety and she felt better even with the small  dosages  she was getting. The main problem she expressed was trouble sleeping the  Trazodone seemed to help with that and consequently she  began to feel  better.   Even by the time of discharge  she still  had a significant amount of  anxiety, but for her  it was much less than what she was dealing with when  she came in and a manageable level from her standpoint. She denied any  suicidal intent throughout the stay and the passive suicidal thoughts when  she first came in were gone by the time of discharge. Consequently she was  discharged to home.   FINAL DIAGNOSES:   AXIS I:  Generalized anxiety disorder.   AXIS II:  No diagnosis.   AXIS III:  1. Hypokalemia.  2. History of coronary artery disease.  3. Benign essential tremor.  4. Hypertension by history.   AXIS IV:  Moderate.   AXIS V:  50/62.   DISCHARGE MEDICATIONS:  1. Colace 100 mg b.i.d.  2. Xanax 0.25 tablets up to 1.2 tablet q.i.d. p.r.n.  3. Clonazepam 0.5 mg q. h.s.  4. Tenormin 50 mg tablets, taking 2 per day.  5. Hygroton 25 mg daily.  6. Trazodone 50 mg tablets taking 1/2 to 1 tablet q. h.s. p.r.n.  7. Potassium chloride 20 mEq daily, she was given 7 tablets to fill.    DISCHARGE INSTRUCTIONS:  There were no restrictions placed on her activity  or diet. She was advised to follow up with her primary doctor to recheck her  potassium. She will follow up with Dr. Milford Cage and Dr. Caralyn Guile  after discharge.                                               Carolanne Grumbling, M.D.    GT/MEDQ  D:  12/07/2002  T:  12/08/2002  Job:  [id number]

## 2010-08-04 NOTE — Discharge Summary (Signed)
   NAME:  Mallory Perez, Mallory Perez NO.:  [id number]   MEDICAL RECORD NO.:  [id number]                   PATIENT TYPE:  INP   LOCATION:  3015                                 FACILITY:  [facility]   PHYSICIAN:  Stefani Dama, M.D.               DATE OF BIRTH:  05-31-1931   DATE OF ADMISSION:  04/13/2002  DATE OF DISCHARGE:  04/18/2002                                 DISCHARGE SUMMARY   ADMISSION DIAGNOSIS:  Pseudoarthrosis, L4-5, spondylolisthesis with stenosis  L4-5 and L3-4.  Lumbar radiculopathy.   DISCHARGE/FINAL DIAGNOSES:  1. Pseudoarthrosis, L4-5, spondylolisthesis with stenosis L4-5 and L3-4.     Lumbar radiculopathy.  2. Coronary artery disease.   CONDITION ON DISCHARGE:  Improving.   HOSPITAL COURSE:  The patient is a 77 year old individual who has had  previous lumbar surgery with attempted arthrodesis at L4-L5.  She had  developed progressive spondylolisthesis and had significant radiculopathy.  She was advised regarding surgical decompression and stabilization, which  was performed on 04/13/2002.  Her procedure was tolerated well.  The patient  did have a significant underlying history of  coronary artery disease.  She  was maintained in the Intensive Care Unit during the first 48-hour period  during which time she did not develop any cardiac arrhythmia or hemodynamic  instability.  She was mildly confused on some of her pain medications, and  she notes that she tolerates very small doses of most pain medications;  otherwise, she has significant side effects.  She has been maintained on  oral Percocet  At the current time, her incision is clean and dry, and she  is ambulatory.  She is discharged home at this time with a prescription for  Percocet as needed for pain and Flexeril as a muscle relaxer.  She will be  seen in the office in three weeks time for further follow up.                                               Stefani Dama, M.D.    Merla Riches  D:  04/16/2002  T:  04/17/2002  Job:  [id number]

## 2010-08-04 NOTE — H&P (Signed)
NAME:  Mallory Perez, PIEHL NO.:  [id number]   MEDICAL RECORD NO.:  [id number]                   PATIENT TYPE:  IPS   LOCATION:  0300                                 [facility]ILITY[facility]   PHYSICIAN:  Jeanice Lim, M.D.              DATE OF BIRTH:  1931/10/03   DATE OF ADMISSION:  11/16/2002  DATE OF DISCHARGE:                         PSYCHIATRIC ADMISSION ASSESSMENT   DATE OF ASSESSMENT:  November 17, 2002   PATIENT IDENTIFICATION:  This is a 77 year old married white female who is a  voluntary admission.   HISTORY OF PRESENT ILLNESS:  This patient who goes by the name of Mallory Perez or  Mallory Perez reports my whole body collapsed yesterday.  The patient called  her psychiatrist complaining of overall weakness and fearful about her  medications.  She had presented a list at the time of assessment of 37  medications that she states she was allergic to or could not take.  Her  psychiatrist referred her to rule out delusional disorder.  This patient  with a history of anxiety disorder and panic attacks reports that she has  been feeling more and more anxious over the past two to three weeks but is  unable to explain why and denies any precipitating events.  She reports that  she has been unable to sleep at all over the past week and presented twice  in the emergency room with panic attacks, fearing that she might be having a  heart attack but had a negative workup, by her report.  One night, feeling  desperate for sleep, she took one half of a 10 mg Ambien and then felt  anxious that it was going to cause her symptoms.  She also had been  prescribed lorazepam 0.5 mg, took one quarter of a tablet and then believed  that it was causing her chest pain.  The patient says the most she slept all  week was six hours one night after she had taken the Ambien.  The patient  endorses suicidal thoughts, feeling that on the day of admission, she would  have done anything or taken  anything just to get rid of her feelings of  anxiety.   PAST PSYCHIATRIC HISTORY:  This is her first inpatient admission, first  admission to [facility].  She has been followed by  Jasmine Pang, M.D., once to establish and has also seen in the past  David L. Dellia Cloud, Ph.D., at [facility].  No history  of prior suicide attempts.   SUBSTANCE ABUSE HISTORY:  The patient denies any use of alcohol or illegal  substances.  She is a nonsmoker, uses no tobacco.   PAST MEDICAL HISTORY:  The patient is followed by [facility].  She has a history of lumbar spondylolisthesis with stenosis  L4-5 and possibly some spondylolisthesis in her cervical vertebrae.  She had  surgery in January 2004 to decompress lumbar radiculopathy.  The patient  also has a history of hypertension, hysterectomy in 1995, and status post  bladder tack in 1990.  She was hospitalized in June 2004 for generalized  weakness and, at that time, had a neurologic consult done to rule out  Parkinson's disease based on a very slight pill rolling tremor that was  observed and some slowness of movement.  Parkinson's disease was ruled out  and the patient was diagnosed with benign essential tremor.  The patient  also has a history of a left retinal hemorrhage in October 2003 of unclear  etiology.  The patient reports that she was also told that she may have had  a myocardial infarction in the past based on previous EKG readings.   MEDICATIONS:  1. Micro-K 600 mg p.o. daily, that is 8 mEq.  2. Xanax 0.25 mg; the patient takes one quarter tablet q.i.d. p.r.n. for     anxiety.  3. Tylenol 325 mg; the patient takes one half tablet as needed for pain or     discomfort.  4. Atenolol/chlorthalidone 50/25 mg one quarter to one half tablet one to     two times daily as needed for her hypertension.  Generally, she takes one     quarter of a tablet every morning.  5.  Colace two tablets daily for constipation.   DRUG ALLERGIES:  There is some question that apparently, according to review  of records, shows that the patient has multiple sensitivities but no clear  history of allergy.  She believes that Bextra caused her chest pain,  Voltaren caused her some type of stomach upset, as did aspirin and Relafen.   REVIEW OF SYSTEMS:  Review of systems today is remarkable for no  palpitations, no chest pain.  The patient reports she was able to eat  breakfast this morning and feels actually quite good, much improved because  she slept well last night on her medication.   PHYSICAL EXAMINATION:  GENERAL:  This is a medium-built, elderly female who  appears to be her stated age of 77.  VITAL SIGNS:  On admission to the unit, temperature 98.8, pulse 69,  respirations 19, blood pressure 189/112 initially.  She was 5 feet 4 inches  and weighed 137 pounds.  This morning on review of her vital signs reviews  the patient's blood pressure is 110/75 standing and 155/97 sitting.  She is  afebrile.  HEENT:  Head is normocephalic, atraumatic.  EENT: PERRLA.  Sclerae are  nonicteric.  No nystagmus on EOM.  No rhinorrhea noted.  Oropharynx is in  satisfactory condition.  No breath odor.  Teeth show evidence of repair.  NECK:  Supple, no thyromegaly, no lymphadenopathy.  Range of motion is full  and active.  CARDIOVASCULAR:  S1 and S2 are heard; no murmurs or extra sounds  appreciated.  Apical pulse is synchronous with radial pulse.  LUNGS:  Clear to auscultation.  ABDOMEN:  Flat, soft, nontender, normal bowel sounds.  No masses  appreciated.  GENITALIA:  Deferred.  BREAST:  Exam deferred.  MUSCULOSKELETAL:  Extremities: No edema, pink and warm.  No swelling or  joint deformity.  Strength is 5/5.  NEUROLOGIC:  Cranial nerves II-XII are intact.  Facial symmetry is present. Grip strength: Equal bilaterally.  The patient does have an expressive face;  today, she is able  to smile although affect still remains somewhat  constricted.  Strength, tone, and mass seems  normal.  She has a very slight  resting tremor in hands and arms with a slight pill rolling quality to it;  no cogwheeling or rigidity.  Her gait appears somewhat stiff but is  symmetrical with normal arm swing.  No stooping or bradykinesia noted.  Deep  tendon reflexes are sluggish and difficult to elicit 1+/5.   LABORATORY DATA:  Diagnostic studies reveal urine drug screen and urinalysis  are currently pending.   SOCIAL HISTORY:  The patient is married and lives in the [place] part  of [place].  She has grown children who are supportive.  She has  been married many years with a stable relationship with her spouse, no legal  problems.  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   FAMILY HISTORY:  Family history is not clear.   MENTAL STATUS EXAM:  This is a fully alert female who is in no acute  distress who appears improved over yesterday's description of her.  She is  able to smile, talk, anxious to carry on conversation.  She initiates  conversation and questions.  Concentration is good, tracks fairly well.  She  does display some mild psychomotor slowing and her responses are a bit  slowed and her voice tone soft but otherwise, speech within normal limits.  She is cooperative, directable, polite, and pleasant, fully oriented x 3.  Mood is somewhat guarded and anxious.  Thought process reveals some mild  agitation intermittently, some mild paranoia and guarding, being very overly  cautious about her medications and fearing that these are causing her  reactions but is unable to see any other factors that may be causing stomach  upset or any of her other symptoms.  She is positive for suicidal ideation,  thinking that she may have to overdose on her medications if her symptoms go  on; no homicidal ideation.   She admits she is feeling much better today but  was feeling quite suicidal yesterday and does not feel safe to go home, no  able to promise safety in the community at this point.   ADMISSION DIAGNOSES:   AXIS I:  1. Rule out delusional disorder, not otherwise specified.  2. Anxiety disorder, not otherwise specified.   AXIS II:  No diagnosis.   AXIS III:  1. Hypokalemia.  2. Coronary artery disease.  3. Benign essential tremor.   AXIS IV:  Moderate problems with anxiety with questionable medication  compliance at home.   AXIS V:  Current 25, past year 43.   INITIAL PLAN OF CARE:  Plan is to voluntarily admit the patient with q.37m.  checks in place.  We had made available to her Zyprexa 2.5 mg p.o. q.6.h.  p.r.n. agitation but she had not required that and actually felt greatly  improved on trazodone 50 mg last h.s.  We are going to check her urine for signs of a urinary tract infection and then have started her on K-Dur 40 mEq  b.i.d. in order to correct her hypokalemia.  We will continue to evaluate  this and consider her for starting an additional SSRI.  Although she has  responded quite well to the trazodone, we will consider adding Celexa and  will use the Zyprexa only on a p.r.n. basis at this point, monitor her  response, and get some feedback from her family.   ESTIMATED LENGTH OF STAY:  Six days.     Margaret A.  Stephannie Peters                   Jeanice Lim, M.D.    MAS/MEDQ  D:  11/18/2002  T:  11/18/2002  Job:  [id number]

## 2010-08-04 NOTE — Consult Note (Signed)
NAME:  Mallory Perez, Mallory Perez NO.:  [id number]   MEDICAL RECORD NO.:  [id number]                   PATIENT TYPE:  INP   LOCATION:  3006                                 FACILITY:  [facility]   PHYSICIAN:  Deanna Artis. Sharene Skeans, M.D.           DATE OF BIRTH:  Jan 04, 1932   DATE OF CONSULTATION:  09/15/2002  DATE OF DISCHARGE:                                   CONSULTATION   REPORT TITLE:  NEUROLOGY CONSULTATION.   REFERRING PHYSICIAN:  Rene Paci, M.D. [facility]   CHIEF COMPLAINT:  Tremor.   HISTORY OF PRESENT CONDITION:  I was asked by Dr. Felicity Coyer to see Mallory Perez, a 77 year old right-handed Caucasian married woman who has had a  tremor in her extremities over the past few years.  This has been noted by  several people who have evaluated her including my partner, Dr. Porfirio Mylar  Dohmeier and also neurosurgeon, Dr. Barnett Abu, all who have come away  believing that the tremor was an essential tremor.   The patient was noted today on examination to be walking rather stiffly and  straight up, to have a bit of difficulty with balancing, and to perhaps have  some bradykinesia in addition to having a resting tremor that seemed to  involve the ulnar digits more so than the radial digits of her hand.  On the  basis of these observations, I was asked to evaluate the patient for  possible Parkinson's disease.   The patient has been seen twice in the past week in the emergency room for  nausea, anorexia, generalized feeling of weakness and, on one occasion, was  thought to be dehydrated.  The patient has not had pain in her body nor has  she had headache.  She has been exercising regularly up until about ten days  ago, walking about two miles a day.  On the day prior to admission, the  patient walked for a mile on the treadmill but did not feel well.  Her  symptoms have waxed and waned.  She was admitted to the hospital for further  observation.   PAST MEDICAL  HISTORY:  1. The patient has lumbar spinal stenosis from previous lumbar surgery about     12 years ago that was a failed fusion and has a pseudoarthrosis of L4-L5.     The patient has had impingement of her nerve roots and, according to her,     a L3-5 laminectomy with bone graft and screws placed in to hold the graft     and the bones in place.  She experienced immediate relief of her event.  2. In October 2003, she had a left retinal hemorrhage with decreased vision.     She has been followed by Dr. Stormy Fabian.  Etiology of this is     unknown.  3. The patient has mild hypertension.  4. Hypokalemia.  5. Anxiety disorder.  CURRENT MEDICATIONS:  1. Atenolol/hydrochlorothiazide 50/25, 1/4 of a tablet daily.  2. K-Dur/Micro K 600 ? one daily.  3. Xanax 0.25 mg, 1/4 of a tablet at bedtime.   ALLERGIES:  The patient has had intolerances to a number of medications  including ANTIHYPERTENSIVES, MUSCLE RELAXERS, ANTI-INFLAMMATORY agents,  ANTIBIOTICS but she has no true allergies to medicines.   PAST SURGICAL HISTORY:  1. Lumbar laminectomy in 1990, and again in January 2004.  2. Bladder tack in 1990.  3. Breast biopsy and also hysterectomy in 1995.  4. Oophorectomy at the same time.   FAMILY HISTORY:  Negative for Parkinson's disease, Alzheimer's, strokes,  seizures, mental retardation, blindness, deafness.   SOCIAL HISTORY:  The patient does not use tobacco or alcohol.  She is  married.  She had four children who are all healthy.   REVIEW OF SYSTEMS:  As noted above.  The patient had an episode of feeling  dizzy last week that passed on its own.  She has had some problems with  constipation.  The patient has had decreased appetite without weight loss.  She received IV fluids in the emergency room over the weekend and felt  somewhat better only for her symptoms to recur requiring hospitalization.   PHYSICAL EXAMINATION:  GENERAL:  This is a well-developed, well-nourished   woman who appears her stated age, in no distress.  She is right-handed.  VITAL SIGNS:  Temperature 97.5, blood pressure 153/99, resting pulse 70,  respirations 20, pulse oximetry 94% on room air.  HEENT:  No signs of infection.  NECK:  No bruits.  Decreased range of motion in extension in ear to the  shoulder, less so in flexion.  LUNGS:  Clear.  HEART:  No murmurs.  Pulses normal.  ABDOMEN:  Soft.  Bowel sounds normal.  No hepatosplenomegaly.  EXTREMITIES:  Normal except that her feet are purple and cool to touch.  Hands are a little dusky but they are also cool to the touch.  This is a  chronic problem according to the patient.  NEUROLOGICAL:  Mental status:  The patient is awake, alert, attentive, no  dysphagia, dyspraxia.  She is oriented.  She has normal memory and normal  fund of knowledge.  Cranial nerves:  Round, reactive pupils.  Normal fundi.  I cannot see the hemorrhage.  Visual fields full to double simultaneous  stimuli.  Symmetric facial strength.  Midline tongue.  Air conduction  greater than bone conduction.  The patient has an expressive face.  Motor  examination:  Normal strength, tone, and mass.  Good fine motor movements.  No drift.  She has a resting tremor in her hands that has a pill-rolling  quality to it, but disappears with activity.  No cogwheeling.  No rigidity.  Sensation:  Mild peripheral neuropathy in a stocking/glove distribution to  cold.  She has intact vibration, proprioception, and stereognosis.  Cerebellar examination:  Good finger-to-nose, rapid repetitive movements.  Gait was normal.  She is somewhat stiff in her gait but I think this is  related to her back surgery and not to anything else.  She has a normal  base, normal stride, no bradykinesia or stooping.  Deep tendon reflexes are  symmetric and diminished.  The patient had bilateral flexor plantar  responses.   IMPRESSION:  1. Benign essential tremor (333.1). 2. Left retinal hemorrhage,  unknown etiology.  3. Diffuse subjective weakness of unknown etiology without signs of     objective weakness on formal examination.  4. Nausea.  5. Lumbar spondylosis status post laminectomy.  6. I suspect cervical spondylosis on the basis of her neck movement.   RECOMMENDATIONS:  1. No treatment for tremor at present.  This is not Parkinson's disease.  2. TSH evaluation is important.  3. Cervical spine series is important to look for the presence of     significant spondylosis.  The patient may require MRI of the cervical     spine.  4. Gastroenterology may need to work up the patient's nausea to rule out a     peptic ulcer disease or some other problem causing diffuse nausea.  This     may require both endoscopy and CT of the abdomen and pelvis.  5. I will review the MRI/MRA of the head but I suspect it to be nonspecific,     at best.   I appreciate the opportunity to participate in her care.  I have discussed  this with Dr. Felicity Coyer and also with the patient.  If you have questions or  I can be of assistance, do not hesitate to contact me.                                               Deanna Artis. Sharene Skeans, M.D.    [facility]  D:  09/15/2002  T:  09/15/2002  Job:  [id number]   cc:   [facility]   Rene Paci, M.D. [facility]

## 2010-08-04 NOTE — Op Note (Signed)
NAME:  Mallory Perez, Mallory Perez NO.:  [id number]   MEDICAL RECORD NO.:  [id number]                   PATIENT TYPE:  INP   LOCATION:  3315                                 FACILITY:  [facility]   PHYSICIAN:  Stefani Dama, M.D.               DATE OF BIRTH:  04/18/1931   DATE OF PROCEDURE:  04/13/2002  DATE OF DISCHARGE:                                 OPERATIVE REPORT   PREOPERATIVE DIAGNOSES:  1. Pseudoarthroses L4-5.  2. Spondylolisthesis and stenosis, L4-L5.  Spondylosis L3, L4 with stenosis.     Lumbar radiculopathy.   POSTOPERATIVE DIAGNOSES:  1. Pseudoarthroses L4-5.  2. Spondylolisthesis and stenosis, L4-L5.  Spondylosis L3, L4 with stenosis.     Lumbar radiculopathy.   PROCEDURE:  1. Take-down of pseudoarthroses at L4-L5.  2. Decompression of spondylolisthesis (Gill procedure), L4-L5.  3. Laminectomy L3.  4. Posterior lumbar interbody fusion, L3-4 and L4-5; with bone spacers.  5. Segmental pedicular fixation -- L3, L4, L5.  6. Posterolateral arthrodesis -- L3, L4, L5; with local autograft .  7. Decompression with operating microscope and microdissection technique.   SURGEON:  Stefani Dama, M.D.   FIRST ASSISTANT:  Cristi Loron, M.D.   ANESTHESIA:  General endotracheal.   INDICATIONS:  The patient is a 77 year old individual who has had  significant spondylosis and previous arthrodesis attempt at L4-L5.  She has  a pseudoarthrosis with a spondylolisthesis that has progressed to the  grade 2 status.  She is advised to undergo surgical decompression and  stabilization.   DESCRIPTION OF PROCEDURE:  The patient was brought to the operating room and  lain supine on the stretcher.  After smooth induction of general  endotracheal anesthesia, she was turned prone.  The back was shaved, prepped  with Duraprep and draped in a sterile fashion.  The bony prominences were  appropriately padded and protected.  A midline incision was opened in the  previous bed of the other midline incision; this was carried down to  lumbodorsal fascia.  The fascia was opened on either side of the midline to  expose the first identifiable spinous process; which was known to be that of  L3.  Previous laminectomy had been undertaken at L4.  By carefully  dissecting out over the facet joints and identifying the transverse process  of L3 superiorly, the lateral mass fusion was identified at L4 and below.  By dissecting along the tissue planes, there was noted to be a rent in the  fusion between L4 and L5; and, by applying some motion there was noted to  gross motion between L4 and L5, with a separation cleft that was identified  in the lateral masses of the fused bony segments.  This cleft was developed  and scar tissue was removed from within it, so as to allow identification  and isolation up into the segments.  Once this was  accomplished, part of the  laminectomy that had been performed years ago and had overgrown was  reopened, and the Gill procedure was completed by removing the facets at L3-  L4, and at L4-L5; with the bony overgrown mesh also removed in this region.   Laminectomy at L3 was then completed, removing the portion up to the pars to  allow for decompression of the L3 nerve root superiorly.  The disk space at  L3, L4 was then isolated.  Dr. Lovell Sheehan helped by providing retraction for  the dura.  Because of dense adherent scar tissue, this area was dissected  carefully to the inferior aspect of the decompression.  A dural rent  occurred while the scar was dissected.  With the use of the operating  microscope and the help of Dr. Lovell Sheehan, we were able to isolate the dural  edges on the lateral aspect of the left side, overlying the vertebral body  of L4.  Several interrupted 6-0 Prolene sutures were placed to secure the  dural opening.  Under Valsalva then no further CSF leakage was identified  through this opening.   The procedure was then  completed by again isolating the dura and then  performing diskectomies at  L3-4 and at L4-5.  These were total diskectomies, with the disk space being  evacuated from side-to-side, front-to-back, with all the disk being removed  at L4-5 and similarly at L3-L4.  At L4-5 then, a  T-less bone spacer was initially inserted; this was first started at the 7  mm size, but as the diskectomy was completed the 9 mm size could be inserted  under a slight amount of distraction.  A 9 mm bone was then presoaked.  This  was then tamped into the ventral aspect of the interspace and placed  securely.  The area was then check for hemostasis and bone graft, which was  the autograft from the laminectomy defect itself, was mixed with 15 cc of  Vitoss bone expander and tamped into and around the posterior interbody  spacer.  Then, L3-L4 was similarly instrumented, with PLIF posterior lumbar  interbody bone spacers -- measuring 11 mm in height.  The interbody space  was then also filled with the remainder of the autografting Vitoss mixture.   Pedical entry sites were then chosen at L3, L4 and L5; first on the left  side and then on the right side.  When these were identified to be in good  position and secured, the pedicles were then instrumented first by tapping  with the 5.25 tap, checking for cutout, and then placing 5.75 x 45 mm screws  in L3, L4 and then L5.  The contralateral side was then similarly  instrumented.  A pre-contoured 55 mm long rod was then placed between the  screw heads, which were applied.  The transverse process at L3 and the  lateral mass fusion was decorticated.  With the screws being placed and the  system being left loose, the superior middle screw was first tightened and  then compression was placed between L3 and L4, and that screw was tightened.  Then, further compression was placed between L4 and L5 so as to restore more  normal lordosis between L3 and L5.  The posterolateral  graft was then placed into the gutters that had been  prepared by decortication.  Then, care was taken to make sure that the  common dural tube had no bony spicules or compromise centrally, or in the  lateral recess  in the foramen at L3, L4 and L5.  Once this was ascertained  and hemostasis was obtained, the area was copiously irrigated with  antibiotic irrigating solution.  The lumbodorsal fascia was then closed  securely with #1 Vicryl in interrupted fashion.  Then 2-0 Vicryl was used in  the subcutaneous and subcuticular tissues.  Then 3-0 Vicryl was used  subcuticularly to close the skin.   The patient tolerated the procedure well.  He was returned to recovery room.   ESTIMATED BLOOD LOSS:  1000 cc.  250 cc of self-saver blood was returned to  the patient.                                               Stefani Dama, M.D.    Merla Riches  D:  04/13/2002  T:  04/13/2002  Job:  [id number]

## 2010-08-04 NOTE — Consult Note (Signed)
NAME:  Mallory, Perez NO.:  [id number]   MEDICAL RECORD NO.:  [id number]                   PATIENT TYPE:  IPS   LOCATION:  0305                                 FACILITY:  [facility]   PHYSICIAN:  Casimiro Needle L. Thad Ranger, M.D.           DATE OF BIRTH:  01/04/1932   DATE OF CONSULTATION:  05/12/2003  DATE OF DISCHARGE:                                   CONSULTATION   REASON FOR EVALUATION:  Tremor, possible Parkinson's.   HISTORY OF PRESENT ILLNESS:  This is a re consultation visit for this  existing [facility] patient, age 77 year old woman who  has been followed by in the office by Dr. Melvyn Novas and also as an  inpatient consultation by Dr. Deanna Artis. [place].  She reported she  developed a tremor over the past five years or so which she ascribes to some  failure of a fusion in her lumbar spine.  The tremor is present most of the  time.  It does get better and worse, and it comes out more when she is more  anxious in the resting stage as opposed to activity and really does not  interfere with her normal activities.  Dr. Oliva Bustard impression in August  of 2003 was that she had a benign essential tremor.  Dr. Sharene Skeans who  assessed the patient at [facility] during hospitalization in June of last  year reached the same conclusion.  Neurologic consultation is again  requested for the same issue.  The patient freely reports that if she did  have Parkinson's disease, she would never take medicine for it.   PAST MEDICAL HISTORY:  She has a history of previous lumbar surgery about 13  years ago which was a failed fusion with serial plates and screws.  She  follows Dr. Danielle Dess for this.  She has ________anxiety and has been followed  closely by Dr. Katrinka Blazing for this problem and has actually been in and out of  [facility] for the last several months for this.  She has almost  innumerable medication reactions.   FAMILY/ SOCIAL/ REVIEW OF  SYSTEMS:  As outlined in the psychiatric admission  note.   MEDICATIONS:  Present regimen of medications includes:  1. Atenolol 12.5 mg t.i.d.  2. Potassium b.i.d.  3. Lexapro 5 mg daily.  (presently on hold)  4. Colace.  5. Protonix.  6. Zyprexa 2.5 mg q.h.s.  (presently on hold)   The Lexapro and Zyprexa are presently on hold as we were concerned that they  might be causing worsening of her symptoms.   PHYSICAL EXAMINATION:  VITAL SIGNS:  Temperature 97.4, blood pressure  150/90, pulse 74, respirations 18.  GENERAL:  This is an anxious-appearing woman, seated and in no acute  distress.  HEENT:  Head, atraumatic.  Oropharynx is benign.  NECK:  Supple.  NEUROLOGIC:  Mental status:  She is awake, alert, fully oriented.  Speech is  fluid and not dysarthric.  Her affect is anxious and mood is consistent with  this.  Cranial nerves:  Pupils are equal and reactive.  Extraocular  movements are full without nystagmus.  Face, tongue and palate move normally  and symmetrically.  Motor:  Normal bulk and tone.  There is absolutely no  cogwheeling.  Normal strength in extremity muscles.  She does have a  resting, much greater than postural, __________tremor of the upper  extremities which is not noted in the feet, hand or voice.  The patient is  intact to light touch.  Reflexes are 2+ and symmetric.  Toes are downgoing.  Rapid movements are performed well.  She performs finger-to-nose well.  She  arises easily from a chair, and her gait is normal with no stooping, normal  stride length, and good arm swing.  She is also able to turn well and has no  balance issues.   LABORATORY DATA:  BMET remarkable only for mildly-elevated glucose of 109.  Remarkable for a slightly-elevated hemoglobin and hematocrit.  Urinalysis  negative.  TSH normal.  She has had no neural imaging.   IMPRESSION:  Benign essential tremor even though it comes out more at rest.  Tremor is more resting than postural.  She  has absolutely no other  manifestations of Parkinsonism.  Therefore,  I would not consider this  Parkinsonian.  Note, this is the same conclusion that has been reached in  the past by Dr. Danielle Dess, [name] and Dr. Sharene Skeans.   RECOMMENDATIONS:  Apparently she had been tried on low doses of primidone in  the past, and this could be tried again.  However, given that she has such  difficulty tolerating medications, and the tremor is not in any way  disabling, I would recommend no particular treatment at all.  She may follow  up with [name] as needed.                                               Michael L. Thad Ranger, M.D.    MLR/MEDQ  D:  05/12/2003  T:  05/12/2003  Job:  [id number]

## 2010-08-04 NOTE — Discharge Summary (Signed)
NAME:  Mallory Perez, Mallory Perez NO.:  [id number]   MEDICAL RECORD NO.:  [id number]                   PATIENT TYPE:  IPS   LOCATION:  0502                                 FACILITY:  [facility]   PHYSICIAN:  Geoffery Lyons, M.D.                   DATE OF BIRTH:  Oct 23, 1931   DATE OF ADMISSION:  05/09/2003  DATE OF DISCHARGE:  05/20/2003                                 DISCHARGE SUMMARY   CHIEF COMPLAINT AND PRESENT ILLNESS:  This was the second admission to [facility] for this 77 year old married, white female,  voluntarily admitted.  History of depression, passive suicidal ideation,  telling the husband that she is going to die, called the children, reported  fearful of taking medication due to side effects in the past, stomach  burning, worries, states that she feels weak, feels like she is in a  nightmare, suicidal ruminations.  Claimed that the Zoloft and most other  medication have severe side effects.   PAST PSYCHIATRIC HISTORY:  Second time at [facility].  Was admitted  in August of 2004.  History of anxiety.  Depression.  Has seen Dr. Milford Cage and Dr. Jacqulyn Bath.   ALCOHOL/DRUG HISTORY:  Denies the use or abuse of any substances.   MEDICAL HISTORY:  1. Back surgery  2. Arterial hypertension  3. Essential tremor.   MEDICATIONS:  1. Celexa for three days with muscle pain  2. Potassium 20 mEq daily  3. Zoloft for a week  4. Atenolol 50 mg, 1/4 three times a day  5. Colace 100 mg, 2 in the morning.   ALLERGIES:  Claims MOST EVERYTHING.   PHYSICAL EXAMINATION:  Evidenced a tremor, short steps, some rigidity.   MENTAL STATUS EXAM:  Alert, cooperative female.  Fair eye contact.  Speech  is clear.  Mood anxious and depressed.  Smiling but anxious.  Thought  process somatically focused, afraid of medications, of the side effects, not  being able to tolerate, beating herself up.  No hallucinations.  No  delusions.  No suicidal or  homicidal ideation.  Cognition well-preserved.   ADMISSION DIAGNOSES:  AXIS I:  1.  Major depression, recurrent.  1. Generalized anxiety disorder.  AXIS II:  No diagnosis.  AXIS III:  1.  Benign essential tremor.  1. Coronary artery disease.  2. Hypokalemia.  AXIS IV:  Moderate.  AXIS V:  Global Assessment of Functioning upon admission 30; highest Global  Assessment of Functioning in the last year 65.   HOSPITAL COURSE:  She was admitted and started intensive individual and  group psychotherapy.  Very resistant to take medication but, at the same  time, very anxious.  Difficulty tolerating them.  Burning sensation all  over.  Nausea, agitation, when taking a different antidepressant.  Concerned, worried.  Understands that she worries so much that she beats  herself up.  We tried several antidepressants but she continued to be  somatically focused.  We tried Lexapro, which she experienced a reaction to  it.  Worried concerned, ruminating, beating herself up.  Resistant even  taking benzodiazepines.  We prescribed small amounts that she was resistant  to take.  Her outpatient psychiatrist had basically stated that they have  tried everything that they need to try and she was still sensitive, very  resistant to take medications.  We also tried Paxil with side effects.  We  tried several placebo sleeping medications and finally we tried Remeron 7.5  mg that she seemed to be able to tolerate better.  Endorsed no burning and  no flushing with the Remeron.  Did sleep better on the Remeron.  We  increased it to 15 mg.  Took 15 mg with no problem.  Began to be more  hopeful.  We reassured her.  On May 19, 2003, she was still ruminating but  willing to continue to give Remeron a try.  On May 20, 2003, was the fourth  day on Remeron.  No major problems.  Willing to pursue it.  No side effects.  No suicidal ideation.  No homicidal ideation.  More optimistic.  We went  ahead and discharged to  outpatient follow-up.   DISCHARGE DIAGNOSES:  AXIS I:  1.  Major depression, recurrent.  1. Generalized anxiety disorder.  AXIS II:  No diagnosis.  AXIS III:  1.  Benign essential tremor.  1. Coronary artery disease.  2. Hypokalemia.  AXIS IV:  Moderate.  AXIS V:  Global Assessment of Functioning upon discharge 50.   DISCHARGE MEDICATIONS:  1. Tenormin 25 mg, 1/2 mg three times a day.  2. K-Dur 20 mEq twice a day.  3. Colace 100 mg twice a day.  4. Protonix 40 mg daily.  5. Remeron 15 mg at night.  6. Sonata 10 mg at bedtime as needed for sleep.  7. Xanax 0.25 mg, 1/2-1 every four hours as needed.   FOLLOW UP:  [facility], [facility].                                               Geoffery Lyons, M.D.    IL/MEDQ  D:  06/09/2003  T:  06/10/2003  Job:  [id number]

## 2010-09-12 ENCOUNTER — Other Ambulatory Visit: Payer: Self-pay | Admitting: Family Medicine

## 2010-09-19 ENCOUNTER — Other Ambulatory Visit: Payer: Self-pay | Admitting: Family Medicine

## 2010-10-19 ENCOUNTER — Other Ambulatory Visit: Payer: Self-pay | Admitting: Neurological Surgery

## 2010-10-19 DIAGNOSIS — M542 Cervicalgia: Secondary | ICD-10-CM

## 2010-10-22 ENCOUNTER — Ambulatory Visit
Admission: RE | Admit: 2010-10-22 | Discharge: 2010-10-22 | Disposition: A | Discharge status: Home / Self Care | Payer: PRIVATE HEALTH INSURANCE | Source: Ambulatory Visit | Attending: Neurological Surgery | Admitting: Neurological Surgery

## 2010-10-22 DIAGNOSIS — M542 Cervicalgia: Secondary | ICD-10-CM

## 2011-01-15 ENCOUNTER — Other Ambulatory Visit: Payer: Self-pay | Admitting: Family Medicine

## 2011-02-14 ENCOUNTER — Encounter: Payer: Self-pay | Admitting: Family Medicine

## 2011-02-14 ENCOUNTER — Ambulatory Visit (INDEPENDENT_AMBULATORY_CARE_PROVIDER_SITE_OTHER): Payer: Medicare Other | Admitting: Family Medicine

## 2011-02-14 VITALS — BP 132/82 | HR 54 | Temp 97.8°F

## 2011-02-14 DIAGNOSIS — G20 Parkinson's disease: Secondary | ICD-10-CM

## 2011-02-14 MED ORDER — TRAZODONE 25 MG HALF TABLET: 25.0000 mg | ORAL_TABLET | Freq: Every day | ORAL | Status: DC

## 2011-02-14 NOTE — Progress Notes (Signed)
77 yo Mallory Perez. Mallory with her husband. Had another fall over thanksgiving.  Her walker got got in some clothing on the floor.  Hit the counter under her left eye. No visual problems.  Saw neurosurgeon- Dr. Danielle Dess in August. MRI neg for lesion in cervical spine. Recommended f/u with Dr. Richardean Chimera to try parkinson's meds- previously could not tolerate sinemet.  Only on amitriptyline for sleep. Wants to try trazodone as it worked well in past.  Patient Active Problem List  Diagnoses  . HYPOKALEMIA  . UNSPECIFIED ANEMIA  . DEPRESSION  . PARKINSON'S DISEASE  . HYPERTENSION  . MYOCARDIAL INFARCTION, HX OF  . INSOMNIA  . ABNORMALITY OF GAIT  . CERUMEN IMPACTION, BILATERAL  . HEARING LOSS, BILATERAL   Past Medical History  Diagnosis Date  . Depression   . Hypertension   . Myocardial infarction 1987   Past Surgical History  Procedure Date  . Back surgery 2003 & 01/1989  . Abdominal hysterectomy 1995  . Bladder surgery 09/1988   History  Substance Use Topics  . Smoking status: Never Smoker   . Smokeless tobacco: Not on file  . Alcohol Use: Not on file   Family History  Problem Relation Age of Onset  . Angina Mother     and MI  . Alcohol abuse Other     brain tumor  . Diabetes Other    No Known Allergies Current Outpatient Prescriptions on File Prior to Visit  Perez Sig Dispense Refill  . amitriptyline (ELAVIL) 10 MG tablet TAKE 2 TABLETS BY MOUTH AT BEDTIME  60 tablet  3   The PMH, PSH, Social History, Family History, Medications, and allergies have been reviewed in [facility], and have been updated if relevant.  See HPI  Pos for insomnia Physical Exam  BP 132/82  Pulse 54  Temp(Src) 97.8 F (36.6 C) (Oral)  General: alert, well-developed, well-nourished, and well-hydrated. NAD sitting quietly in wheel chair  Head: normocephalic, atraumatic, and no abnormalities observed.  Eyes: pupils equal, pupils round, and no injection.  Ears: R ear  normal and L ear normal.  Nose: no external deformity.  Mouth: no gingival abnormalities.  Lungs: normal respiratory effort, no intercostal retractions, no accessory muscle use, and normal breath sounds.  Heart: occasional extra beat--no pattern noted, no murmur heard  Abdomen: soft, non-tender, normal bowel sounds, and no guarding.  Msk: able to raise both arms above her head slowly  Neurologic: alert & oriented X3,-tremors in both hands at rest, less so with intention  Skin: color normal and no rashes.  Psych: normally interactive, flat affect, and subdued.   Assessment and Plan: 1. PARKINSON'S DISEASE   >25 min spent with face to face with patient, >50% counseling and/or coordinating care Deteriorated with increasing problems with her balance. I recommended that she absolutely needs to see [name] again to discuss other treatment options for PD since she did not tolerate Sinemet. ?possible PD/neuro PT. The patient and her husband indicate understanding of these issues and agrees with the plan. Refilled her Trazodone but discussed that this would increase her fall risk.     Ass

## 2011-05-14 ENCOUNTER — Encounter: Payer: Self-pay | Admitting: Neurology

## 2011-05-14 ENCOUNTER — Encounter: Payer: Self-pay | Admitting: Family Medicine

## 2011-05-14 ENCOUNTER — Ambulatory Visit (INDEPENDENT_AMBULATORY_CARE_PROVIDER_SITE_OTHER): Payer: Medicare Other | Admitting: Family Medicine

## 2011-05-14 VITALS — BP 142/90 | HR 64 | Temp 98.1°F | Wt 126.0 lb

## 2011-05-14 DIAGNOSIS — G20 Parkinson's disease: Secondary | ICD-10-CM | POA: Diagnosis not present

## 2011-05-14 DIAGNOSIS — E876 Hypokalemia: Secondary | ICD-10-CM | POA: Diagnosis not present

## 2011-05-14 DIAGNOSIS — G4700 Insomnia, unspecified: Secondary | ICD-10-CM

## 2011-05-14 LAB — BASIC METABOLIC PANEL
CO2: 31 mEq/L (ref 19–32)
Calcium: 9.3 mg/dL (ref 8.4–10.5)
Creatinine, Ser: 0.8 mg/dL (ref 0.4–1.2)
GFR: 74.85 mL/min (ref >60.00)
Glucose, Bld: 103 mg/dL — ABNORMAL HIGH (ref 70–99)

## 2011-05-14 NOTE — Progress Notes (Signed)
78 yo here to discuss her medication. Here with her husband. Feels her PD is getting worse. Was seeing Dr. Golden Hurter but was discharged from practice (pt denies this). Not taking any PD medications-previously could not tolerate sinemet.[facility] Johns neurosurgeon- Dr. Danielle Dess in August 2012. MRI neg for lesion in cervical spine.  Husband is frustrated because he has to help her get up to the bathroom and care for her now that legs are weakening. Has had HH in past but husband felt it was not sufficient care so he is caring for her now.   Patient Active Problem List  Diagnoses  . HYPOKALEMIA  . UNSPECIFIED ANEMIA  . DEPRESSION  . PARKINSON'S DISEASE  . HYPERTENSION  . MYOCARDIAL INFARCTION, HX OF  . INSOMNIA  . ABNORMALITY OF GAIT  . CERUMEN IMPACTION, BILATERAL  . HEARING LOSS, BILATERAL   Past Medical History  Diagnosis Date  . Depression   . Hypertension   . Myocardial infarction 1987   Past Surgical History  Procedure Date  . Back surgery 2003 & 01/1989  . Abdominal hysterectomy 1995  . Bladder surgery 09/1988   History  Substance Use Topics  . Smoking status: Never Smoker   . Smokeless tobacco: Not on file  . Alcohol Use: Not on file   Family History  Problem Relation Age of Onset  . Angina Mother     and MI  . Alcohol abuse Other     brain tumor  . Diabetes Other    No Known Allergies Current Outpatient Prescriptions on File Prior to Visit  Medication Sig Dispense Refill  . amitriptyline (ELAVIL) 10 MG tablet TAKE 2 TABLETS BY MOUTH AT BEDTIME  60 tablet  3  . atenolol (TENORMIN) 50 MG tablet Take 1/2 tablet by mouth daily      . fish oil-omega-3 fatty acids 1000 MG capsule Take 1 g by mouth daily.        . Multiple Vitamin (MULTIVITAMIN) tablet Take 1 tablet by mouth daily.        . polyethylene glycol powder (MIRALAX) powder Take 17 g by mouth daily.        . potassium chloride SA (KLOR-CON M20) 20 MEQ tablet        . traZODone (DESYREL) 25 mg TABS Take 0.5  tablets (25 mg total) by mouth at bedtime.  30 tablet  3   The PMH, PSH, Social History, Family History, Medications, and allergies have been reviewed in [facility], and have been updated if relevant.  See HPI  Pos for insomnia Physical Exam  BP 142/90  Pulse 64  Temp(Src) 98.1 F (36.7 C) (Oral)  Wt 126 lb (57.153 kg)  General: alert, well-developed, well-nourished, and well-hydrated. NAD sitting quietly in wheel chair  Head: normocephalic, atraumatic, and no abnormalities observed.  Eyes: pupils equal, pupils round, and no injection.  Ears: R ear normal and L ear normal.  Nose: no external deformity.  Mouth: no gingival abnormalities.  Lungs: normal respiratory effort, no intercostal retractions, no accessory muscle use, and normal breath sounds.  Heart: occasional extra beat--no pattern noted, no murmur heard  Abdomen: soft, non-tender, normal bowel sounds, and no guarding.  Msk: able to raise both arms above her head slowly  Neurologic: alert & oriented X3,-tremors in both hands at rest, less so with intention  Skin: color normal and no rashes.  Psych: normally interactive, flat affect, and subdued.   Assessment and Plan: 1. PARKINSON'S DISEASE  Ambulatory referral to Neurology    >  25 min spent with face to face with patient, >50% counseling and/or coordinating care Deteriorated with increasing problems with her balance. I recommended that she absolutely needs to see a neurologist again to discuss other treatment options for PD since she did not tolerate Sinemet. Will refer to [name] since she was discharged from previous practice. ?possible PD/neuro PT. Also discussed ALF vs SNF- they will visit surrounding facilities. The patient and her husband indicate understanding of these issues and agrees with the plan.

## 2011-05-14 NOTE — Patient Instructions (Signed)
Please stop by to see Mallory Perez on your way out. Call me with an update of your urinary symptoms after you cut back on water.

## 2011-06-11 ENCOUNTER — Other Ambulatory Visit: Payer: Self-pay | Admitting: *Deleted

## 2011-06-11 MED ORDER — POTASSIUM CHLORIDE CRYS ER 20 MEQ PO TBCR: 20.0000 meq | EXTENDED_RELEASE_TABLET | Freq: Every day | ORAL | Status: DC

## 2011-06-11 NOTE — Telephone Encounter (Signed)
Received faxed refill request from pharmacy. Refill sent to pharmacy electronically. 

## 2011-06-15 ENCOUNTER — Other Ambulatory Visit: Payer: Self-pay | Admitting: Family Medicine

## 2011-07-03 ENCOUNTER — Encounter: Payer: Self-pay | Admitting: Neurology

## 2011-07-03 ENCOUNTER — Ambulatory Visit (INDEPENDENT_AMBULATORY_CARE_PROVIDER_SITE_OTHER): Payer: Medicare Other | Admitting: Neurology

## 2011-07-03 VITALS — BP 180/92 | HR 72 | Ht 64.0 in | Wt 126.0 lb

## 2011-07-03 DIAGNOSIS — G20 Parkinson's disease: Secondary | ICD-10-CM

## 2011-07-03 DIAGNOSIS — G20A1 Parkinson's disease without dyskinesia, without mention of fluctuations: Secondary | ICD-10-CM

## 2011-07-03 MED ORDER — AMANTADINE HCL 100 MG PO TABS: ORAL_TABLET | ORAL | Status: DC

## 2011-07-03 MED ORDER — MELATONIN 3 MG PO TABS: ORAL_TABLET | ORAL | Status: DC

## 2011-07-03 NOTE — Patient Instructions (Signed)
We will refer [facility] to [facility] located at [address] in [place] for the [employer] . They will call you to make the appointment.   [phone].  Your new prescriptions have been sent to your pharmacy.  Follow up with [name] in 3 months.

## 2011-07-03 NOTE — Progress Notes (Signed)
Dear Dr. Dayton Martes,  Thank you for having me see Mallory Perez in consultation today at [facility] for her problem with parkinsonism.  As you may recall, she is a 78 y.o. year old female with a history of severe depression, parkinsonism, L3-L5 fusion for spondylolisthesis and cervical spondylosis, who complains of difficulty walking, whole body weakness and difficulty with balance.  She had been followed by Dr. Porfirio Mylar Dohmeier at [facility] for her parkinsonism.  Unfortunately the patient or her husband are not able to give a detailed history of the progression of her disease.  She apparently had her last of two lumbar surgeries in 2003 and shortly after that began to develop severe depression.  At around the same time she began to develop tremor(although she may have had a tremor in 1988 that apparently remitted).  She had difficulty walking as well.  She was tried on a number of anti-depressants.  Her use of anti-psychotics at that time is unknown.  She also had ECT.  She had been tried on a number of medications including Sinemet and Mirapex but was unable to tolerate them.  She cannot tell me what the exact reactions to them were.  She was also seen by Dr. Laurence Aly at [facility] but was discharged from that clinic because of non-compliance.  Currently she complains of difficulty walking and severe difficulty with balance.  She is still able to walk with a walker at home.  She has frequent falls.  She complains of bilateral tremor.  She is not able to feed herself, except without utensils.  She is still continent of urine but has significant problems with frequency.  She has had hallucinations recently "seeing people who aren't there."  She may have problems with lightheadedness at times although she says this is not a frequent problem.  It is difficult to tell which side her tremor started on or whether is is bilateral.  She complains of significant difficulty sleeping - she has tried a number of medications  including Elavil, desipramine, trazadone and mirtazipine with little effect.  Other medications that have been tried for various indications include alprazolam, Lexapro, Zoloft, Clonidine, lorazepam, chlordiazepoxide, Celexa, clonazepam.  Past Medical History  Diagnosis Date  . Depression   . Hypertension   . Myocardial infarction 1987    Past Surgical History  Procedure Date  . Back surgery 2003 & 01/1989  . Abdominal hysterectomy 1995  . Bladder surgery 09/1988    History   Social History  . Marital Status: Married    Spouse Name: N/A    Number of Children: N/A  . Years of Education: N/A   Social History Main Topics  . Smoking status: Never Smoker   . Smokeless tobacco: None  . Alcohol Use: Yes     wine occasionally  . Drug Use: No  . Sexually Active: None   Other Topics Concern  . None   Social History Narrative  . None    Family History  Problem Relation Age of Onset  . Angina Mother     and MI  . Alcohol abuse Other     brain tumor  . Diabetes Other     Current Outpatient Prescriptions on File Prior to Visit  Medication Sig Dispense Refill  . amitriptyline (ELAVIL) 10 MG tablet TAKE 2 TABLETS BY MOUTH AT BEDTIME  60 tablet  3  . atenolol (TENORMIN) 50 MG tablet TAKE 1/2 TABLET BY MOUTH TWICE A DAY  30 tablet  11  .  fish oil-omega-3 fatty acids 1000 MG capsule Take 1 g by mouth daily.        . polyethylene glycol powder (MIRALAX) powder Take 17 g by mouth daily.        .      . Multiple Vitamin (MULTIVITAMIN) tablet Take 1 tablet by mouth daily.        . potassium chloride SA (KLOR-CON M20) 20 MEQ tablet Take 1 tablet (20 mEq total) by mouth daily.  90 tablet  0  . traZODone (DESYREL) 25 mg TABS Take 0.5 tablets (25 mg total) by mouth at bedtime.  30 tablet  3    No Known Allergies    ROS:  13 systems were reviewed and are notable for very labile blood pressure, blurred vision, anxiety and depression.  All other review of systems are  unremarkable.   Examination:  Filed Vitals:   07/03/11 1312  BP: 180/92  Pulse: 72  Height: 5\' 4"  (1.626 m)  Weight: 126 lb (57.153 kg)     In general, unkempt women in NAD.  Cardiovascular: The patient has a regular rate and rhythm and no carotid bruits.  Fundoscopy:  Disks are flat. Vessel caliber within normal limits.  Mental status:   The patient is oriented to person, place and time. Recent and remote memory are intact. Attention span and concentration are normal. Language including repetition, naming, following commands are intact. Fund of knowledge of current and historical events, as well as vocabulary are normal.  Cranial Nerves: Masked facies.Pupils are equally round and reactive to light. Visual fields full to confrontation. EOMs are full, saccades hypometric but not terribly slow.  Facial sensation and muscles of mastication are intact. Muscles of facial expression are symmetric. Hearing decreased to bilateral finger rub. Tongue protrusion, uvula, palate midline.  Palatal elevation normal.  Shoulder shrug intact.  Too and from head tremor.  Severely increase neck tone.  Motor:  Mild anterocollis, and mild rotation of the head to the right.  ++Cogwheeling bilaterally, with bilateral resting and postural tremor.  ++bradykineisa.  5/5 muscle strength UE.  LE 3/5 at HF, elsewhere 4/5.    Reflexes:   Biceps  Triceps Brachioradialis Knee Ankle  Right 2+  2+  2+   2+ 0  Left  2+  2+  2+   2+ 0  Toes mute  Coordination:  Terminal and action tremor bilaterally.  Sensation is intact to light touch bilaterally.  Needs help to stand.  Stooped with very poor postural instability. Short steps, shuffling.  Impression/Recs: 1.  Parkinsonism - Unfortunately we are very inhibited by poor history here.  The differential includes IPD, MSA(the axial rigidity and cervical dystonia, symmetry), LBD(hallucinations - although a lack of other features), drug induced(from her treatment  of severe depression) are all possibilities.  She unfortunately is very reluctant to try any new medications.  I have convinced her to try amantadine with can have some anti-parkinsonian effect and is usually well tolerated.  However, I really don't expect much of a response, but sometimes it can help with tremor. 2.  Balance and gait problems - part of this is from her postural instability from parkinsonism.  There is also deconditioning here, as well as possible contribution from her back.  I am sending her to parkinson's physical therapy to see if this can help. 3.  Difficulty maintaining sleep - I want to try pharmaceutical grade melatonin 3mg  -> 6mg  2 hours before sleep.   We will see the patient back  in 3 months.  Thank you for having [place] see Mallory Perez in consultation.  Feel free to contact me with any questions.  Lupita Raider Modesto Charon, MD [facility], [place] [address] [place], [place] [address] Phone: [phone] Fax: [phone].

## 2011-07-19 ENCOUNTER — Ambulatory Visit: Payer: Medicare Other | Attending: Neurology | Admitting: Occupational Therapy

## 2011-07-19 DIAGNOSIS — M6281 Muscle weakness (generalized): Secondary | ICD-10-CM | POA: Insufficient documentation

## 2011-07-19 DIAGNOSIS — R279 Unspecified lack of coordination: Secondary | ICD-10-CM | POA: Insufficient documentation

## 2011-07-19 DIAGNOSIS — Z5189 Encounter for other specified aftercare: Secondary | ICD-10-CM | POA: Insufficient documentation

## 2011-07-19 DIAGNOSIS — M2420 Disorder of ligament, unspecified site: Secondary | ICD-10-CM | POA: Insufficient documentation

## 2011-07-19 DIAGNOSIS — R269 Unspecified abnormalities of gait and mobility: Secondary | ICD-10-CM | POA: Diagnosis not present

## 2011-07-19 DIAGNOSIS — R4789 Other speech disturbances: Secondary | ICD-10-CM | POA: Diagnosis not present

## 2011-07-19 DIAGNOSIS — G20A1 Parkinson's disease without dyskinesia, without mention of fluctuations: Secondary | ICD-10-CM | POA: Insufficient documentation

## 2011-07-19 DIAGNOSIS — M629 Disorder of muscle, unspecified: Secondary | ICD-10-CM | POA: Insufficient documentation

## 2011-07-19 DIAGNOSIS — M2560 Stiffness of unspecified joint, not elsewhere classified: Secondary | ICD-10-CM | POA: Insufficient documentation

## 2011-07-19 DIAGNOSIS — G20 Parkinson's disease: Secondary | ICD-10-CM | POA: Diagnosis not present

## 2011-07-19 DIAGNOSIS — R5381 Other malaise: Secondary | ICD-10-CM | POA: Diagnosis not present

## 2011-07-25 ENCOUNTER — Ambulatory Visit: Payer: Medicare Other | Admitting: Occupational Therapy

## 2011-07-25 DIAGNOSIS — G20 Parkinson's disease: Secondary | ICD-10-CM | POA: Diagnosis not present

## 2011-07-25 DIAGNOSIS — Z5189 Encounter for other specified aftercare: Secondary | ICD-10-CM | POA: Diagnosis not present

## 2011-07-25 DIAGNOSIS — R269 Unspecified abnormalities of gait and mobility: Secondary | ICD-10-CM | POA: Diagnosis not present

## 2011-07-25 DIAGNOSIS — M629 Disorder of muscle, unspecified: Secondary | ICD-10-CM | POA: Diagnosis not present

## 2011-07-25 DIAGNOSIS — M6281 Muscle weakness (generalized): Secondary | ICD-10-CM | POA: Diagnosis not present

## 2011-07-25 DIAGNOSIS — R279 Unspecified lack of coordination: Secondary | ICD-10-CM | POA: Diagnosis not present

## 2011-07-26 ENCOUNTER — Ambulatory Visit: Payer: Medicare Other | Admitting: Occupational Therapy

## 2011-07-26 DIAGNOSIS — M629 Disorder of muscle, unspecified: Secondary | ICD-10-CM | POA: Diagnosis not present

## 2011-07-26 DIAGNOSIS — G20 Parkinson's disease: Secondary | ICD-10-CM | POA: Diagnosis not present

## 2011-07-26 DIAGNOSIS — R269 Unspecified abnormalities of gait and mobility: Secondary | ICD-10-CM | POA: Diagnosis not present

## 2011-07-26 DIAGNOSIS — R279 Unspecified lack of coordination: Secondary | ICD-10-CM | POA: Diagnosis not present

## 2011-07-26 DIAGNOSIS — Z5189 Encounter for other specified aftercare: Secondary | ICD-10-CM | POA: Diagnosis not present

## 2011-07-26 DIAGNOSIS — M6281 Muscle weakness (generalized): Secondary | ICD-10-CM | POA: Diagnosis not present

## 2011-07-30 ENCOUNTER — Ambulatory Visit: Payer: Medicare Other

## 2011-07-30 ENCOUNTER — Ambulatory Visit: Payer: Medicare Other | Admitting: Physical Therapy

## 2011-07-30 DIAGNOSIS — R269 Unspecified abnormalities of gait and mobility: Secondary | ICD-10-CM | POA: Diagnosis not present

## 2011-07-30 DIAGNOSIS — M629 Disorder of muscle, unspecified: Secondary | ICD-10-CM | POA: Diagnosis not present

## 2011-07-30 DIAGNOSIS — G20 Parkinson's disease: Secondary | ICD-10-CM | POA: Diagnosis not present

## 2011-07-30 DIAGNOSIS — M6281 Muscle weakness (generalized): Secondary | ICD-10-CM | POA: Diagnosis not present

## 2011-07-30 DIAGNOSIS — R279 Unspecified lack of coordination: Secondary | ICD-10-CM | POA: Diagnosis not present

## 2011-07-30 DIAGNOSIS — Z5189 Encounter for other specified aftercare: Secondary | ICD-10-CM | POA: Diagnosis not present

## 2011-08-01 ENCOUNTER — Encounter: Payer: Self-pay | Admitting: Family Medicine

## 2011-08-01 ENCOUNTER — Ambulatory Visit (INDEPENDENT_AMBULATORY_CARE_PROVIDER_SITE_OTHER): Payer: Medicare Other | Admitting: Family Medicine

## 2011-08-01 VITALS — BP 180/84 | HR 80 | Temp 97.6°F | Wt 125.0 lb

## 2011-08-01 DIAGNOSIS — G20A1 Parkinson's disease without dyskinesia, without mention of fluctuations: Secondary | ICD-10-CM

## 2011-08-01 DIAGNOSIS — G20 Parkinson's disease: Secondary | ICD-10-CM | POA: Diagnosis not present

## 2011-08-01 DIAGNOSIS — I739 Peripheral vascular disease, unspecified: Secondary | ICD-10-CM | POA: Diagnosis not present

## 2011-08-01 DIAGNOSIS — R0989 Other specified symptoms and signs involving the circulatory and respiratory systems: Secondary | ICD-10-CM

## 2011-08-01 NOTE — Progress Notes (Signed)
78 yo with h/o severe depression and PD here for acute visit for "both feet are blue."   Here with her husband.  This has been an ongoing issue for several years.  Poor circulation in her feet, both often have a bluish discoloration.  Not getting worse. Actually started PT and feels like coloration has improved since she is standing a little more.  Saw [name], neurology last month. Reviewed his note- his assessment and plan below:  1. parkinsonism -unfortunately inhibited by poor history here.  The differential includes IPD, MSA(the axial rigidity and cervical dystonia, symmetry), LBD(hallucinations - although a lack of other features), drug induced(from her treatment of severe depression) are all possibilities.  She unfortunately is very reluctant to try any new medications.  I have convinced her to try amantadine with can have some anti-parkinsonian effect and is usually well tolerated.  However, I really don't expect much of a response, but sometimes it can help with tremor. 2.  Balance and gait problems - part of this is from her postural instability from parkinsonism.  There is also deconditioning here, as well as possible contribution from her back.  I am sending her to parkinson's physical therapy to see if this can help. 3.  Difficulty maintaining sleep - I want to try pharmaceutical grade melatonin 3mg  -> 6mg  2 hours before sleep.  Pt and her husband feel amantadine is helping a little with her parkisonism.  Saw neurosurgeon- Dr. Danielle Dess in August 2012. MRI neg for lesion in cervical spine.    Patient Active Problem List  Diagnoses  . HYPOKALEMIA  . UNSPECIFIED ANEMIA  . DEPRESSION  . PARKINSON'S DISEASE  . HYPERTENSION  . MYOCARDIAL INFARCTION, HX OF  . INSOMNIA  . ABNORMALITY OF GAIT  . CERUMEN IMPACTION, BILATERAL  . HEARING LOSS, BILATERAL   Past Medical History  Diagnosis Date  . Depression   . Hypertension   . Myocardial infarction 1987   Past Surgical History   Procedure Date  . Back surgery 2003 & 01/1989  . Abdominal hysterectomy 1995  . Bladder surgery 09/1988   History  Substance Use Topics  . Smoking status: Never Smoker   . Smokeless tobacco: Not on file  . Alcohol Use: Yes     wine occasionally   Family History  Problem Relation Age of Onset  . Angina Mother     and MI  . Alcohol abuse Other     brain tumor  . Diabetes Other    No Known Allergies Current Outpatient Prescriptions on File Prior to Visit  Medication Sig Dispense Refill  . Amantadine HCl 100 MG tablet take 1 in the a.m. for 1 week, then increase to 1 twice a day from then on.  60 tablet  3  . amitriptyline (ELAVIL) 10 MG tablet TAKE 2 TABLETS BY MOUTH AT BEDTIME  60 tablet  3  . atenolol (TENORMIN) 50 MG tablet TAKE 1/2 TABLET BY MOUTH TWICE A DAY  30 tablet  11  . fish oil-omega-3 fatty acids 1000 MG capsule Take 1 g by mouth daily.        . Melatonin 3 MG TABS take 1 tablet 2 hours before bedtime.  30 tablet  3  . Multiple Vitamin (MULTIVITAMIN) tablet Take 1 tablet by mouth daily.        . polyethylene glycol powder (MIRALAX) powder Take 17 g by mouth daily.        . potassium chloride SA (KLOR-CON M20) 20 MEQ tablet Take  1 tablet (20 mEq total) by mouth daily.  90 tablet  0  . traZODone (DESYREL) 25 mg TABS Take 0.5 tablets (25 mg total) by mouth at bedtime.  30 tablet  3   The PMH, PSH, Social History, Family History, Medications, and allergies have been reviewed in [facility], and have been updated if relevant.  See HPI  Pos for insomnia Physical Exam  Wt 125 lb (56.7 kg) BP 180/84  Pulse 80  Temp(Src) 97.6 F (36.4 C) (Oral)  Wt 125 lb (56.7 kg) BP Readings from Last 3 Encounters:  08/01/11 180/84  07/03/11 180/92  05/14/11 142/90    General: alert, well-developed, well-nourished, and well-hydrated. NAD sitting quietly in wheel chair  Head: normocephalic, atraumatic, and no abnormalities observed.  Eyes: pupils equal, pupils round, and no injection.    Ears: R ear normal and L ear normal.  Nose: no external deformity.  Mouth: no gingival abnormalities.  Lungs: normal respiratory effort, no intercostal retractions, no accessory muscle use, and normal breath sounds.  Heart: occasional extra beat--no pattern noted, no murmur heard  Abdomen: soft, non-tender, normal bowel sounds, and no guarding.  Msk: able to raise both arms above her head slowly  Neurologic: alert & oriented X3,-tremors in both hands at rest, less so with intention  Ext:  Purplish discoloration of feet bilaterally with some spider veins and prominent veins, pos pedal pulses (dimished but equal bilaterally) Psych: normally interactive, flat affect, and subdued.    Assessment and Plan: 1. PARKINSON'S DISEASE  Improved with amantadine. Continue recs per [name].  2. Poor circulation of extremity  Chronic- due to venous stasis, PD and immobility. Discussed continued PT and elevation when possible. The patient indicates understanding of these issues and agrees with the plan.

## 2011-08-03 ENCOUNTER — Ambulatory Visit: Payer: Medicare Other | Admitting: Physical Therapy

## 2011-08-03 ENCOUNTER — Emergency Department (HOSPITAL_COMMUNITY): Payer: Medicare Other

## 2011-08-03 ENCOUNTER — Encounter (HOSPITAL_COMMUNITY): Payer: Self-pay | Admitting: *Deleted

## 2011-08-03 ENCOUNTER — Emergency Department (HOSPITAL_COMMUNITY)
Admission: EM | Admit: 2011-08-03 | Discharge: 2011-08-04 | Disposition: A | Discharge status: Home / Self Care | Payer: Medicare Other | Attending: Emergency Medicine | Admitting: Emergency Medicine

## 2011-08-03 DIAGNOSIS — S42001A Fracture of unspecified part of right clavicle, initial encounter for closed fracture: Secondary | ICD-10-CM

## 2011-08-03 DIAGNOSIS — S0093XA Contusion of unspecified part of head, initial encounter: Secondary | ICD-10-CM

## 2011-08-03 DIAGNOSIS — I252 Old myocardial infarction: Secondary | ICD-10-CM | POA: Insufficient documentation

## 2011-08-03 DIAGNOSIS — G20 Parkinson's disease: Secondary | ICD-10-CM | POA: Diagnosis not present

## 2011-08-03 DIAGNOSIS — Z5189 Encounter for other specified aftercare: Secondary | ICD-10-CM | POA: Diagnosis not present

## 2011-08-03 DIAGNOSIS — G20A1 Parkinson's disease without dyskinesia, without mention of fluctuations: Secondary | ICD-10-CM | POA: Insufficient documentation

## 2011-08-03 DIAGNOSIS — M629 Disorder of muscle, unspecified: Secondary | ICD-10-CM | POA: Diagnosis not present

## 2011-08-03 DIAGNOSIS — F3289 Other specified depressive episodes: Secondary | ICD-10-CM | POA: Insufficient documentation

## 2011-08-03 DIAGNOSIS — I10 Essential (primary) hypertension: Secondary | ICD-10-CM | POA: Diagnosis not present

## 2011-08-03 DIAGNOSIS — M25519 Pain in unspecified shoulder: Secondary | ICD-10-CM | POA: Diagnosis not present

## 2011-08-03 DIAGNOSIS — R269 Unspecified abnormalities of gait and mobility: Secondary | ICD-10-CM | POA: Diagnosis not present

## 2011-08-03 DIAGNOSIS — S1093XA Contusion of unspecified part of neck, initial encounter: Secondary | ICD-10-CM | POA: Diagnosis not present

## 2011-08-03 DIAGNOSIS — R279 Unspecified lack of coordination: Secondary | ICD-10-CM | POA: Diagnosis not present

## 2011-08-03 DIAGNOSIS — F329 Major depressive disorder, single episode, unspecified: Secondary | ICD-10-CM | POA: Diagnosis not present

## 2011-08-03 DIAGNOSIS — S42033A Displaced fracture of lateral end of unspecified clavicle, initial encounter for closed fracture: Secondary | ICD-10-CM | POA: Diagnosis not present

## 2011-08-03 DIAGNOSIS — Y9301 Activity, walking, marching and hiking: Secondary | ICD-10-CM | POA: Insufficient documentation

## 2011-08-03 DIAGNOSIS — W19XXXA Unspecified fall, initial encounter: Secondary | ICD-10-CM | POA: Diagnosis not present

## 2011-08-03 DIAGNOSIS — Z79899 Other long term (current) drug therapy: Secondary | ICD-10-CM | POA: Insufficient documentation

## 2011-08-03 DIAGNOSIS — S42009A Fracture of unspecified part of unspecified clavicle, initial encounter for closed fracture: Secondary | ICD-10-CM | POA: Diagnosis not present

## 2011-08-03 DIAGNOSIS — M6281 Muscle weakness (generalized): Secondary | ICD-10-CM | POA: Diagnosis not present

## 2011-08-03 HISTORY — DX: Unspecified place in unspecified non-institutional (private) residence as the place of occurrence of the external cause: Y92.009

## 2011-08-03 HISTORY — DX: Unspecified fall, initial encounter: W19.XXXA

## 2011-08-03 MED ORDER — HYDROCODONE-ACETAMINOPHEN 5-500 MG PO TABS: 1.0000 | ORAL_TABLET | Freq: Four times a day (QID) | ORAL | Status: AC | PRN

## 2011-08-03 NOTE — Discharge Instructions (Signed)
Clavicle Fracture  A clavicle fracture is a break in the collarbone. This is a common injury, especially in children. Collarbones do not harden until around the age of 20. Most collarbone fractures are treated with a simple arm sling. In some cases a figure-of-eight splint is used to help hold the broken bones in position. Although not often needed, surgery may be required if the bone fragments are not in the correct position (displaced).   HOME CARE INSTRUCTIONS    Apply ice to the injury for 15 to 20 minutes each hour while awake for 2 days. Put the ice in a plastic bag and place a towel between the bag of ice and your skin.   Wear the sling or splint constantly for as long as directed by your caregiver. You may remove the sling or splint for bathing or showering. Be sure to keep your shoulder in the same place as when the sling or splint is on. Do not lift your arm.   If a figure-of-eight splint is applied, it must be tightened by another person every day. Tighten it enough to keep the shoulders held back. Allow enough room to place the index finger between the body and strap. Loosen the splint immediately if you feel numbness or tingling in your hands.   Only take over-the-counter or prescription medicines for pain, discomfort, or fever as directed by your caregiver.   Avoid activities that irritate or increase the pain for 4 to 6 weeks after surgery.   Follow all instructions for follow-up with your caregiver. This includes any referrals, physical therapy, and rehabilitation. Any delay in obtaining necessary care could result in a delay or failure of the injury to heal properly.  SEEK MEDICAL CARE IF:   You have pain and swelling that are not relieved with medications.  SEEK IMMEDIATE MEDICAL CARE IF:   Your arm is numb, cold, or pale, even when the splint is loose.  MAKE SURE YOU:    Understand these instructions.   Will watch your condition.   Will get help right away if you are not doing well or get  worse.  Document Released: 12/13/2004 Document Revised: 02/22/2011 Document Reviewed: 10/09/2007  [employer] Patient Information 2012 [employer].

## 2011-08-03 NOTE — ED Notes (Signed)
Notified ortho for left arm sling

## 2011-08-03 NOTE — ED Provider Notes (Signed)
History     CSN: [id number]  Arrival date & time 08/03/11  2054   First MD Initiated Contact with Patient 08/03/11 2327      Chief Complaint  Patient presents with  . Fall  . Shoulder Pain  . Head Injury    (Consider location/radiation/quality/duration/timing/severity/associated sxs/prior treatment) Patient is a 78 y.o. female presenting with fall. The history is provided by the patient.  Fall The accident occurred 1 to 2 hours ago. The fall occurred while walking. She fell from a height of 1 to 2 ft. She landed on a hard floor. There was no blood loss. The point of impact was the right shoulder and head. The pain is present in the right shoulder. The pain is moderate. She was ambulatory at the scene. The symptoms are aggravated by activity and standing.    Past Medical History  Diagnosis Date  . Depression   . Hypertension   . Myocardial infarction 1987  . Parkinson's disease   . Fall at home     Past Surgical History  Procedure Date  . Back surgery 2003 & 01/1989  . Abdominal hysterectomy 1995  . Bladder surgery 09/1988    Family History  Problem Relation Age of Onset  . Angina Mother     and MI  . Alcohol abuse Other     brain tumor  . Diabetes Other     History  Substance Use Topics  . Smoking status: Never Smoker   . Smokeless tobacco: Not on file  . Alcohol Use: Yes     wine occasionally    OB History    Grav Para Term Preterm Abortions TAB SAB Ect Mult Living                  Review of Systems  All other systems reviewed and are negative.    Allergies  Review of patient's allergies indicates no known allergies.  Home Medications   Current Outpatient Rx  Name Route Sig Dispense Refill  . AMANTADINE HCL 100 MG PO TABS Oral Take 100 mg by mouth See admin instructions. take 1 in the a.m. for 1 week, then increase to 1 twice a day from then on.    . ATENOLOL 50 MG PO TABS Oral Take 25 mg by mouth daily.    Marland Kitchen DOCUSATE SODIUM 100 MG PO CAPS  Oral Take 100 mg by mouth daily.    . OMEGA-3 FATTY ACIDS 1000 MG PO CAPS Oral Take 1 g by mouth daily.      Marland Kitchen MELATONIN 3 MG PO TABS  take 1 tablet 2 hours before bedtime.      BP 174/89  Pulse 74  Temp(Src) 98.3 F (36.8 C) (Oral)  Resp 16  SpO2 95%  Physical Exam  Nursing note and vitals reviewed. Constitutional: She is oriented to person, place, and time. No distress.       Elderly female no acute distress.  HENT:  Head: Normocephalic and atraumatic.  Right Ear: External ear normal.  Left Ear: External ear normal.  Mouth/Throat: Oropharynx is clear and moist.  Eyes: EOM are normal. Pupils are equal, round, and reactive to light.  Neck: Normal range of motion. Neck supple.  Cardiovascular: Normal rate.   No murmur heard. Pulmonary/Chest: Effort normal and breath sounds normal. No respiratory distress.  Abdominal: Soft. Bowel sounds are normal. She exhibits no distension. There is no tenderness.  Musculoskeletal:       There is ttp over the lateral aspect  of the right clavicle.  The rue is neurovasc intact.  Neurological: She is alert and oriented to person, place, and time. No cranial nerve deficit. Coordination normal.  Skin: Skin is warm and dry. She is not diaphoretic.    ED Course  Procedures (including critical care time)  Labs Reviewed - No data to display Dg Shoulder Right  08/03/2011  *RADIOLOGY REPORT*  Clinical Data: Fall, right shoulder pain  RIGHT SHOULDER - 2+ VIEW  Comparison: None.  Findings: Comminuted distal right clavicular fracture noted.  AC joint distance is normal allowing for technique.  The proximal humerus is normal.  No dislocation of the glenohumeral joint. Right lung apex is clear.  IMPRESSION: Comminuted distal right clavicular fracture.  Original Report Authenticated By: Harrel Lemon, M.D.     No diagnosis found.    MDM  She is neuro intact, non-focal neuro exam and without headache.  She is not on blood thinners and does not  require a ct scan.  She will be placed in an arm sling and given pain meds.  To follow up with ortho in one week, return if there are problems.        Geoffery Lyons, MD 08/03/11 [phone]

## 2011-08-03 NOTE — ED Notes (Signed)
Med list reviewed.

## 2011-08-03 NOTE — ED Notes (Addendum)
Pt here with daughter, pt C/o R shoulder pain after fall, fell onto vinyl floor, fell at ~1845, being tx'd for parkinson's, "was told to walk more and she lost balance and tripped", last fall was 3 weeks ago, c/o R shoulder pain, hit the back of her head  And L anterior elbow abrasion. (denies: head, neck or new back pain;  denies LOC, visual changes, nv), h/o chronic back pain.

## 2011-08-04 NOTE — ED Notes (Signed)
Pt fell earlier this evening, states she lost her balance.  Alert and oriented at this time.  Denies loss of consciousness, c/o right chest/arm pain and HA.

## 2011-08-07 ENCOUNTER — Ambulatory Visit: Payer: Medicare Other | Admitting: Physical Therapy

## 2011-08-07 ENCOUNTER — Encounter: Payer: Medicare Other | Admitting: Occupational Therapy

## 2011-08-08 ENCOUNTER — Encounter: Payer: Medicare Other | Admitting: Occupational Therapy

## 2011-08-08 DIAGNOSIS — M25519 Pain in unspecified shoulder: Secondary | ICD-10-CM | POA: Diagnosis not present

## 2011-08-08 DIAGNOSIS — S42023A Displaced fracture of shaft of unspecified clavicle, initial encounter for closed fracture: Secondary | ICD-10-CM | POA: Diagnosis not present

## 2011-08-15 ENCOUNTER — Encounter: Payer: Medicare Other | Admitting: Speech Pathology

## 2011-08-15 ENCOUNTER — Encounter: Payer: Medicare Other | Admitting: Occupational Therapy

## 2011-08-15 ENCOUNTER — Ambulatory Visit: Payer: Medicare Other | Admitting: Physical Therapy

## 2011-08-16 ENCOUNTER — Encounter: Payer: Medicare Other | Admitting: Occupational Therapy

## 2011-08-16 ENCOUNTER — Ambulatory Visit: Payer: Medicare Other | Admitting: Physical Therapy

## 2011-08-16 DIAGNOSIS — Z5189 Encounter for other specified aftercare: Secondary | ICD-10-CM | POA: Diagnosis not present

## 2011-08-16 DIAGNOSIS — R269 Unspecified abnormalities of gait and mobility: Secondary | ICD-10-CM | POA: Diagnosis not present

## 2011-08-16 DIAGNOSIS — G20 Parkinson's disease: Secondary | ICD-10-CM | POA: Diagnosis not present

## 2011-08-16 DIAGNOSIS — IMO0001 Reserved for inherently not codable concepts without codable children: Secondary | ICD-10-CM | POA: Diagnosis not present

## 2011-08-20 DIAGNOSIS — G20 Parkinson's disease: Secondary | ICD-10-CM | POA: Diagnosis not present

## 2011-08-20 DIAGNOSIS — R269 Unspecified abnormalities of gait and mobility: Secondary | ICD-10-CM | POA: Diagnosis not present

## 2011-08-20 DIAGNOSIS — Z5189 Encounter for other specified aftercare: Secondary | ICD-10-CM | POA: Diagnosis not present

## 2011-08-20 DIAGNOSIS — IMO0001 Reserved for inherently not codable concepts without codable children: Secondary | ICD-10-CM | POA: Diagnosis not present

## 2011-08-22 DIAGNOSIS — R269 Unspecified abnormalities of gait and mobility: Secondary | ICD-10-CM | POA: Diagnosis not present

## 2011-08-22 DIAGNOSIS — G20 Parkinson's disease: Secondary | ICD-10-CM | POA: Diagnosis not present

## 2011-08-22 DIAGNOSIS — IMO0001 Reserved for inherently not codable concepts without codable children: Secondary | ICD-10-CM | POA: Diagnosis not present

## 2011-08-22 DIAGNOSIS — Z5189 Encounter for other specified aftercare: Secondary | ICD-10-CM | POA: Diagnosis not present

## 2011-08-23 DIAGNOSIS — S42023A Displaced fracture of shaft of unspecified clavicle, initial encounter for closed fracture: Secondary | ICD-10-CM | POA: Diagnosis not present

## 2011-08-23 DIAGNOSIS — M25519 Pain in unspecified shoulder: Secondary | ICD-10-CM | POA: Diagnosis not present

## 2011-08-24 DIAGNOSIS — R269 Unspecified abnormalities of gait and mobility: Secondary | ICD-10-CM | POA: Diagnosis not present

## 2011-08-24 DIAGNOSIS — Z5189 Encounter for other specified aftercare: Secondary | ICD-10-CM | POA: Diagnosis not present

## 2011-08-24 DIAGNOSIS — IMO0001 Reserved for inherently not codable concepts without codable children: Secondary | ICD-10-CM | POA: Diagnosis not present

## 2011-08-24 DIAGNOSIS — G20 Parkinson's disease: Secondary | ICD-10-CM | POA: Diagnosis not present

## 2011-08-27 DIAGNOSIS — IMO0001 Reserved for inherently not codable concepts without codable children: Secondary | ICD-10-CM | POA: Diagnosis not present

## 2011-08-27 DIAGNOSIS — G20 Parkinson's disease: Secondary | ICD-10-CM | POA: Diagnosis not present

## 2011-08-27 DIAGNOSIS — R269 Unspecified abnormalities of gait and mobility: Secondary | ICD-10-CM | POA: Diagnosis not present

## 2011-08-27 DIAGNOSIS — Z5189 Encounter for other specified aftercare: Secondary | ICD-10-CM | POA: Diagnosis not present

## 2011-08-28 DIAGNOSIS — Z5189 Encounter for other specified aftercare: Secondary | ICD-10-CM | POA: Diagnosis not present

## 2011-08-28 DIAGNOSIS — G20 Parkinson's disease: Secondary | ICD-10-CM | POA: Diagnosis not present

## 2011-08-28 DIAGNOSIS — IMO0001 Reserved for inherently not codable concepts without codable children: Secondary | ICD-10-CM | POA: Diagnosis not present

## 2011-08-28 DIAGNOSIS — R269 Unspecified abnormalities of gait and mobility: Secondary | ICD-10-CM | POA: Diagnosis not present

## 2011-08-29 DIAGNOSIS — Z5189 Encounter for other specified aftercare: Secondary | ICD-10-CM | POA: Diagnosis not present

## 2011-08-29 DIAGNOSIS — R269 Unspecified abnormalities of gait and mobility: Secondary | ICD-10-CM | POA: Diagnosis not present

## 2011-08-29 DIAGNOSIS — G20 Parkinson's disease: Secondary | ICD-10-CM | POA: Diagnosis not present

## 2011-08-29 DIAGNOSIS — IMO0001 Reserved for inherently not codable concepts without codable children: Secondary | ICD-10-CM | POA: Diagnosis not present

## 2011-08-30 DIAGNOSIS — Z5189 Encounter for other specified aftercare: Secondary | ICD-10-CM | POA: Diagnosis not present

## 2011-08-30 DIAGNOSIS — IMO0001 Reserved for inherently not codable concepts without codable children: Secondary | ICD-10-CM | POA: Diagnosis not present

## 2011-08-30 DIAGNOSIS — G20 Parkinson's disease: Secondary | ICD-10-CM | POA: Diagnosis not present

## 2011-08-30 DIAGNOSIS — R269 Unspecified abnormalities of gait and mobility: Secondary | ICD-10-CM | POA: Diagnosis not present

## 2011-08-31 DIAGNOSIS — Z5189 Encounter for other specified aftercare: Secondary | ICD-10-CM | POA: Diagnosis not present

## 2011-08-31 DIAGNOSIS — R269 Unspecified abnormalities of gait and mobility: Secondary | ICD-10-CM | POA: Diagnosis not present

## 2011-08-31 DIAGNOSIS — IMO0001 Reserved for inherently not codable concepts without codable children: Secondary | ICD-10-CM | POA: Diagnosis not present

## 2011-08-31 DIAGNOSIS — G20 Parkinson's disease: Secondary | ICD-10-CM | POA: Diagnosis not present

## 2011-09-03 DIAGNOSIS — Z5189 Encounter for other specified aftercare: Secondary | ICD-10-CM | POA: Diagnosis not present

## 2011-09-03 DIAGNOSIS — G20 Parkinson's disease: Secondary | ICD-10-CM | POA: Diagnosis not present

## 2011-09-03 DIAGNOSIS — IMO0001 Reserved for inherently not codable concepts without codable children: Secondary | ICD-10-CM | POA: Diagnosis not present

## 2011-09-03 DIAGNOSIS — R269 Unspecified abnormalities of gait and mobility: Secondary | ICD-10-CM | POA: Diagnosis not present

## 2011-09-05 ENCOUNTER — Telehealth: Payer: Self-pay | Admitting: Neurology

## 2011-09-05 DIAGNOSIS — Z5189 Encounter for other specified aftercare: Secondary | ICD-10-CM | POA: Diagnosis not present

## 2011-09-05 DIAGNOSIS — IMO0001 Reserved for inherently not codable concepts without codable children: Secondary | ICD-10-CM | POA: Diagnosis not present

## 2011-09-05 DIAGNOSIS — G20 Parkinson's disease: Secondary | ICD-10-CM | POA: Diagnosis not present

## 2011-09-05 DIAGNOSIS — R269 Unspecified abnormalities of gait and mobility: Secondary | ICD-10-CM | POA: Diagnosis not present

## 2011-09-05 NOTE — Telephone Encounter (Signed)
Mallory Perez returned my call. We discussed patient's med for sleep. She was not taking the Melatonin 3 mg as per Dr. Nash Dimmer last office note recommended (may increase to 6 mg 2 hours before bed). She was taking OTC Melatonin 1 mg. Mallory Perez was going to call and discuss this with her. I also recommended that while trying the proper dose of Melatonin, she should stop taking the OTC sleep aid Advil so that the effectiveness of the Melatonin can be determined. Mallory Perez will let her know this as well.

## 2011-09-05 NOTE — Telephone Encounter (Signed)
Left a message for Tresa Endo with [facility] to return my call.

## 2011-09-05 NOTE — Telephone Encounter (Signed)
Pt's therapist at [facility], Tresa Endo, called to report that the pt has not slept in several days. She states that pt is taking 2 Advil PMs and 1 mg of melatonin, but nothing seems to be helping. Can we please call Tresa Endo back to advise?

## 2011-09-06 DIAGNOSIS — IMO0001 Reserved for inherently not codable concepts without codable children: Secondary | ICD-10-CM | POA: Diagnosis not present

## 2011-09-06 DIAGNOSIS — R269 Unspecified abnormalities of gait and mobility: Secondary | ICD-10-CM | POA: Diagnosis not present

## 2011-09-06 DIAGNOSIS — G20 Parkinson's disease: Secondary | ICD-10-CM | POA: Diagnosis not present

## 2011-09-06 DIAGNOSIS — Z5189 Encounter for other specified aftercare: Secondary | ICD-10-CM | POA: Diagnosis not present

## 2011-09-07 DIAGNOSIS — Z5189 Encounter for other specified aftercare: Secondary | ICD-10-CM | POA: Diagnosis not present

## 2011-09-07 DIAGNOSIS — R269 Unspecified abnormalities of gait and mobility: Secondary | ICD-10-CM | POA: Diagnosis not present

## 2011-09-07 DIAGNOSIS — G20 Parkinson's disease: Secondary | ICD-10-CM | POA: Diagnosis not present

## 2011-09-07 DIAGNOSIS — IMO0001 Reserved for inherently not codable concepts without codable children: Secondary | ICD-10-CM | POA: Diagnosis not present

## 2011-09-08 DIAGNOSIS — Z5189 Encounter for other specified aftercare: Secondary | ICD-10-CM | POA: Diagnosis not present

## 2011-09-08 DIAGNOSIS — R269 Unspecified abnormalities of gait and mobility: Secondary | ICD-10-CM | POA: Diagnosis not present

## 2011-09-08 DIAGNOSIS — G20 Parkinson's disease: Secondary | ICD-10-CM | POA: Diagnosis not present

## 2011-09-08 DIAGNOSIS — IMO0001 Reserved for inherently not codable concepts without codable children: Secondary | ICD-10-CM | POA: Diagnosis not present

## 2011-09-10 DIAGNOSIS — IMO0001 Reserved for inherently not codable concepts without codable children: Secondary | ICD-10-CM | POA: Diagnosis not present

## 2011-09-10 DIAGNOSIS — G20 Parkinson's disease: Secondary | ICD-10-CM | POA: Diagnosis not present

## 2011-09-10 DIAGNOSIS — R269 Unspecified abnormalities of gait and mobility: Secondary | ICD-10-CM | POA: Diagnosis not present

## 2011-09-10 DIAGNOSIS — Z5189 Encounter for other specified aftercare: Secondary | ICD-10-CM | POA: Diagnosis not present

## 2011-09-11 DIAGNOSIS — IMO0001 Reserved for inherently not codable concepts without codable children: Secondary | ICD-10-CM | POA: Diagnosis not present

## 2011-09-11 DIAGNOSIS — Z5189 Encounter for other specified aftercare: Secondary | ICD-10-CM | POA: Diagnosis not present

## 2011-09-11 DIAGNOSIS — R269 Unspecified abnormalities of gait and mobility: Secondary | ICD-10-CM | POA: Diagnosis not present

## 2011-09-11 DIAGNOSIS — G20 Parkinson's disease: Secondary | ICD-10-CM | POA: Diagnosis not present

## 2011-09-12 DIAGNOSIS — IMO0001 Reserved for inherently not codable concepts without codable children: Secondary | ICD-10-CM | POA: Diagnosis not present

## 2011-09-12 DIAGNOSIS — Z5189 Encounter for other specified aftercare: Secondary | ICD-10-CM | POA: Diagnosis not present

## 2011-09-12 DIAGNOSIS — R269 Unspecified abnormalities of gait and mobility: Secondary | ICD-10-CM | POA: Diagnosis not present

## 2011-09-12 DIAGNOSIS — G20 Parkinson's disease: Secondary | ICD-10-CM | POA: Diagnosis not present

## 2011-09-13 DIAGNOSIS — R269 Unspecified abnormalities of gait and mobility: Secondary | ICD-10-CM | POA: Diagnosis not present

## 2011-09-13 DIAGNOSIS — G20 Parkinson's disease: Secondary | ICD-10-CM | POA: Diagnosis not present

## 2011-09-13 DIAGNOSIS — Z5189 Encounter for other specified aftercare: Secondary | ICD-10-CM | POA: Diagnosis not present

## 2011-09-13 DIAGNOSIS — IMO0001 Reserved for inherently not codable concepts without codable children: Secondary | ICD-10-CM | POA: Diagnosis not present

## 2011-09-15 DIAGNOSIS — R269 Unspecified abnormalities of gait and mobility: Secondary | ICD-10-CM | POA: Diagnosis not present

## 2011-09-15 DIAGNOSIS — Z5189 Encounter for other specified aftercare: Secondary | ICD-10-CM | POA: Diagnosis not present

## 2011-09-15 DIAGNOSIS — IMO0001 Reserved for inherently not codable concepts without codable children: Secondary | ICD-10-CM | POA: Diagnosis not present

## 2011-09-15 DIAGNOSIS — G20 Parkinson's disease: Secondary | ICD-10-CM | POA: Diagnosis not present

## 2011-09-17 ENCOUNTER — Telehealth: Payer: Self-pay | Admitting: Neurology

## 2011-09-17 DIAGNOSIS — R269 Unspecified abnormalities of gait and mobility: Secondary | ICD-10-CM | POA: Diagnosis not present

## 2011-09-17 DIAGNOSIS — IMO0001 Reserved for inherently not codable concepts without codable children: Secondary | ICD-10-CM | POA: Diagnosis not present

## 2011-09-17 DIAGNOSIS — Z5189 Encounter for other specified aftercare: Secondary | ICD-10-CM | POA: Diagnosis not present

## 2011-09-17 DIAGNOSIS — G20 Parkinson's disease: Secondary | ICD-10-CM | POA: Diagnosis not present

## 2011-09-17 NOTE — Telephone Encounter (Signed)
Spoke with Mallory Perez. I let her know that it appeared that the patient's Elavil had been d/ced but I wasn't sure by whom. I also told her that [name] had wanted her to take prescription strength Melatonin 3 mg at hs. I also believe that from a telephone conversation on 6/19 with the PT, Tresa Endo that the patient never tried the 3 mg tablet but rather just bought OTC. Mallory Perez states she will f/u with the patient about the Melatonin and the Elavil. Advised her to call us if additional questions and to have the patient call us if she was having problems.

## 2011-09-17 NOTE — Telephone Encounter (Signed)
Pt's OT, Nicole Kindred, called in regards to pt's prescriptions. According to pt, she stopped taking amitriptyline per Dr. Nash Dimmer instructions. She was having trouble sleeping so we prescribed melatonin. Pt informed Marylene Land that she has started taking her amitriptyline again on her own last week. OT wanted to keep [place] informed and ask [place] what they needed to do in regards to this medication change. Please call Marylene Land back to advise.

## 2011-09-18 DIAGNOSIS — Z5189 Encounter for other specified aftercare: Secondary | ICD-10-CM | POA: Diagnosis not present

## 2011-09-18 DIAGNOSIS — R269 Unspecified abnormalities of gait and mobility: Secondary | ICD-10-CM | POA: Diagnosis not present

## 2011-09-18 DIAGNOSIS — IMO0001 Reserved for inherently not codable concepts without codable children: Secondary | ICD-10-CM | POA: Diagnosis not present

## 2011-09-18 DIAGNOSIS — G20 Parkinson's disease: Secondary | ICD-10-CM | POA: Diagnosis not present

## 2011-09-19 DIAGNOSIS — Z5189 Encounter for other specified aftercare: Secondary | ICD-10-CM | POA: Diagnosis not present

## 2011-09-19 DIAGNOSIS — G20 Parkinson's disease: Secondary | ICD-10-CM | POA: Diagnosis not present

## 2011-09-19 DIAGNOSIS — IMO0001 Reserved for inherently not codable concepts without codable children: Secondary | ICD-10-CM | POA: Diagnosis not present

## 2011-09-19 DIAGNOSIS — R269 Unspecified abnormalities of gait and mobility: Secondary | ICD-10-CM | POA: Diagnosis not present

## 2011-09-24 DIAGNOSIS — R269 Unspecified abnormalities of gait and mobility: Secondary | ICD-10-CM | POA: Diagnosis not present

## 2011-09-24 DIAGNOSIS — G20 Parkinson's disease: Secondary | ICD-10-CM | POA: Diagnosis not present

## 2011-09-24 DIAGNOSIS — Z5189 Encounter for other specified aftercare: Secondary | ICD-10-CM | POA: Diagnosis not present

## 2011-09-24 DIAGNOSIS — IMO0001 Reserved for inherently not codable concepts without codable children: Secondary | ICD-10-CM | POA: Diagnosis not present

## 2011-09-25 ENCOUNTER — Telehealth: Payer: Self-pay | Admitting: Neurology

## 2011-09-25 NOTE — Telephone Encounter (Signed)
Pt states that the 3 mg Melatonin is still not helping her sleep. She reports trouble falling asleep and staying asleep. She has been up for the past 3 nights.

## 2011-09-25 NOTE — Telephone Encounter (Signed)
I left a message for the patient to call me.

## 2011-09-25 NOTE — Telephone Encounter (Signed)
Patient left a message on my voice mail: "I am still having trouble sleeping. I am taking the Melatonin 3 mg at bedtime. I want to know how much I can take safely? Call me back at [phone]."

## 2011-09-25 NOTE — Telephone Encounter (Signed)
Called and spoke with the patient. Told her it was ok to take the Melatonin 3 mg #2 at HS (see ov note as per [name]). She states she will first try one and a half at HS. She says her husband can break them in half for her. I asked that she call me with an update next week and she states she will.

## 2011-09-26 DIAGNOSIS — R269 Unspecified abnormalities of gait and mobility: Secondary | ICD-10-CM | POA: Diagnosis not present

## 2011-09-26 DIAGNOSIS — IMO0001 Reserved for inherently not codable concepts without codable children: Secondary | ICD-10-CM | POA: Diagnosis not present

## 2011-09-26 DIAGNOSIS — Z5189 Encounter for other specified aftercare: Secondary | ICD-10-CM | POA: Diagnosis not present

## 2011-09-26 DIAGNOSIS — G20 Parkinson's disease: Secondary | ICD-10-CM | POA: Diagnosis not present

## 2011-09-28 DIAGNOSIS — G20 Parkinson's disease: Secondary | ICD-10-CM | POA: Diagnosis not present

## 2011-09-28 DIAGNOSIS — IMO0001 Reserved for inherently not codable concepts without codable children: Secondary | ICD-10-CM | POA: Diagnosis not present

## 2011-09-28 DIAGNOSIS — R269 Unspecified abnormalities of gait and mobility: Secondary | ICD-10-CM | POA: Diagnosis not present

## 2011-09-28 DIAGNOSIS — Z5189 Encounter for other specified aftercare: Secondary | ICD-10-CM | POA: Diagnosis not present

## 2011-10-01 DIAGNOSIS — Z5189 Encounter for other specified aftercare: Secondary | ICD-10-CM | POA: Diagnosis not present

## 2011-10-01 DIAGNOSIS — G20 Parkinson's disease: Secondary | ICD-10-CM | POA: Diagnosis not present

## 2011-10-01 DIAGNOSIS — R269 Unspecified abnormalities of gait and mobility: Secondary | ICD-10-CM | POA: Diagnosis not present

## 2011-10-01 DIAGNOSIS — IMO0001 Reserved for inherently not codable concepts without codable children: Secondary | ICD-10-CM | POA: Diagnosis not present

## 2011-10-02 ENCOUNTER — Other Ambulatory Visit: Payer: Self-pay | Admitting: Family Medicine

## 2011-10-03 DIAGNOSIS — Z5189 Encounter for other specified aftercare: Secondary | ICD-10-CM | POA: Diagnosis not present

## 2011-10-03 DIAGNOSIS — IMO0001 Reserved for inherently not codable concepts without codable children: Secondary | ICD-10-CM | POA: Diagnosis not present

## 2011-10-03 DIAGNOSIS — R269 Unspecified abnormalities of gait and mobility: Secondary | ICD-10-CM | POA: Diagnosis not present

## 2011-10-03 DIAGNOSIS — G20 Parkinson's disease: Secondary | ICD-10-CM | POA: Diagnosis not present

## 2011-10-05 DIAGNOSIS — IMO0001 Reserved for inherently not codable concepts without codable children: Secondary | ICD-10-CM | POA: Diagnosis not present

## 2011-10-05 DIAGNOSIS — Z5189 Encounter for other specified aftercare: Secondary | ICD-10-CM | POA: Diagnosis not present

## 2011-10-05 DIAGNOSIS — G20 Parkinson's disease: Secondary | ICD-10-CM | POA: Diagnosis not present

## 2011-10-05 DIAGNOSIS — R269 Unspecified abnormalities of gait and mobility: Secondary | ICD-10-CM | POA: Diagnosis not present

## 2011-10-08 DIAGNOSIS — G20 Parkinson's disease: Secondary | ICD-10-CM | POA: Diagnosis not present

## 2011-10-08 DIAGNOSIS — IMO0001 Reserved for inherently not codable concepts without codable children: Secondary | ICD-10-CM | POA: Diagnosis not present

## 2011-10-08 DIAGNOSIS — Z5189 Encounter for other specified aftercare: Secondary | ICD-10-CM | POA: Diagnosis not present

## 2011-10-08 DIAGNOSIS — R269 Unspecified abnormalities of gait and mobility: Secondary | ICD-10-CM | POA: Diagnosis not present

## 2011-10-09 DIAGNOSIS — Z5189 Encounter for other specified aftercare: Secondary | ICD-10-CM | POA: Diagnosis not present

## 2011-10-09 DIAGNOSIS — G20 Parkinson's disease: Secondary | ICD-10-CM | POA: Diagnosis not present

## 2011-10-09 DIAGNOSIS — IMO0001 Reserved for inherently not codable concepts without codable children: Secondary | ICD-10-CM | POA: Diagnosis not present

## 2011-10-09 DIAGNOSIS — R269 Unspecified abnormalities of gait and mobility: Secondary | ICD-10-CM | POA: Diagnosis not present

## 2011-10-10 DIAGNOSIS — IMO0001 Reserved for inherently not codable concepts without codable children: Secondary | ICD-10-CM | POA: Diagnosis not present

## 2011-10-10 DIAGNOSIS — G20 Parkinson's disease: Secondary | ICD-10-CM | POA: Diagnosis not present

## 2011-10-10 DIAGNOSIS — Z5189 Encounter for other specified aftercare: Secondary | ICD-10-CM | POA: Diagnosis not present

## 2011-10-10 DIAGNOSIS — R269 Unspecified abnormalities of gait and mobility: Secondary | ICD-10-CM | POA: Diagnosis not present

## 2011-10-11 DIAGNOSIS — Z5189 Encounter for other specified aftercare: Secondary | ICD-10-CM | POA: Diagnosis not present

## 2011-10-11 DIAGNOSIS — R269 Unspecified abnormalities of gait and mobility: Secondary | ICD-10-CM | POA: Diagnosis not present

## 2011-10-11 DIAGNOSIS — IMO0001 Reserved for inherently not codable concepts without codable children: Secondary | ICD-10-CM | POA: Diagnosis not present

## 2011-10-11 DIAGNOSIS — G20 Parkinson's disease: Secondary | ICD-10-CM | POA: Diagnosis not present

## 2011-10-15 ENCOUNTER — Telehealth: Payer: Self-pay | Admitting: Neurology

## 2011-10-15 DIAGNOSIS — S42023A Displaced fracture of shaft of unspecified clavicle, initial encounter for closed fracture: Secondary | ICD-10-CM | POA: Diagnosis not present

## 2011-10-15 NOTE — Telephone Encounter (Signed)
The patient left me a message stating "I'm about to panic this morning. My feet won't do I thing I tell them to. They won't listen to me. Call me back so I can talk to you." 10/15/11 @ 1145: called and spoke with the patient. She states since about last Thursday her feet won't do what she wants them to. She reports that her husband has to help her up out of her chair and then once up and standing she "can't make her feet do right." She is taking her meds with no changes. She says her husband thinks she isn't trying hard enough. She is sch for f/u with [name] on 8/5 but I moved her appointment up to 7/31 at 10:00. She is fine to wait.

## 2011-10-17 ENCOUNTER — Encounter: Payer: Self-pay | Admitting: Neurology

## 2011-10-17 ENCOUNTER — Ambulatory Visit (INDEPENDENT_AMBULATORY_CARE_PROVIDER_SITE_OTHER): Payer: Medicare Other | Admitting: Neurology

## 2011-10-17 VITALS — BP 138/86 | HR 84

## 2011-10-17 DIAGNOSIS — G20 Parkinson's disease: Secondary | ICD-10-CM | POA: Diagnosis not present

## 2011-10-17 DIAGNOSIS — G20A1 Parkinson's disease without dyskinesia, without mention of fluctuations: Secondary | ICD-10-CM

## 2011-10-17 MED ORDER — MELATONIN 1 MG PO TABS: ORAL_TABLET | ORAL | Status: DC

## 2011-10-17 NOTE — Progress Notes (Signed)
Dear Dr. Dayton Martes,   Thank you for having me see Mallory Perez in follow up today at [facility] for her problem with parkinsonism. As you may recall, she is a 78 y.o. year old female with a history of severe depression, parkinsonism, L3-L5 fusion for spondylolisthesis and cervical spondylosis, who complains of difficulty walking, whole body weakness and difficulty with balance. She had been followed by Dr. Porfirio Mylar Dohmeier at [facility] for her parkinsonism. Unfortunately the patient or her husband are not able to give a detailed history of the progression of her disease. She apparently had her last of two lumbar surgeries in 2003 and shortly after that began to develop severe depression. At around the same time she began to develop tremor(although she may have had a tremor in 1988 that apparently remitted). She had difficulty walking as well. She was tried on a number of anti-depressants. Her use of anti-psychotics at that time is unknown. She also had ECT. She had been tried on a number of medications including Sinemet and Mirapex but was unable to tolerate them. She cannot tell me what the exact reactions to them were. She was also seen by Dr. Laurence Aly at [facility] but was discharged from that clinic because of non-compliance.  At first clinic visit I decided to try amantadine, as she had not tried it before, despite not being sure as to the cause of her parkinsonism.  She reports that the amantadine helped - her husband said it helped her move better.  Unfortunately she has had at least 2 falls, one where she broke her collar bone on the right.  One of the main issues for her was her inability to sleep.  I started her on melatonin(she has been on several other agents, listed in my last note) and she did get some results from 3mg  2 hours before sleep.  Unfortunately, she felt that 6mg  made her BP go too high and then too low.  She did not want to cut the 3mg  pills so has been on 1 of those.  Still sleep seems to be  a major issue with the patient not being able to sleep 50% of her nights.  Medical history, social history, and family history were reviewed and have not changed since the last clinic visit.  Current Outpatient Prescriptions on File Prior to Visit  Medication Sig Dispense Refill  . Amantadine HCl 100 MG tablet Take 100 mg by mouth See admin instructions. take 1 in the a.m. for 1 week, then increase to 1 twice a day from then on.      . atenolol (TENORMIN) 50 MG tablet TAKE 1/2 TABLET BY MOUTH TWICE A DAY  60 tablet  6  . docusate sodium (COLACE) 100 MG capsule Take 100 mg by mouth daily.      . fish oil-omega-3 fatty acids 1000 MG capsule Take 1 g by mouth daily.        . Melatonin 3 MG TABS take 1 tablet 2 hours before bedtime.        No Known Allergies  ROS:  13 systems were reviewed and are notable for mild right shoulder pain, although remarkably little given her distal clavicular fracture .  All other review of systems are unremarkable.  Exam: . Filed Vitals:   10/17/11 0934  BP: 138/86  Pulse: 84    In general, masked facies, with ++ decreased blink.  Mental status:   The patient is oriented to person, place and time. Recent and  remote memory are intact. Attention span and concentration are normal. Language including repetition, naming, following commands are intact. Fund of knowledge of current and historical events, as well as vocabulary are normal.  Cranial Nerves: EOMs are full, saccades hypometric.  Too and fro head tremor.  ++ increased neck tone.  Motor:  ++cogwheeling bilaterally, with bilateral rest and postural tremor, ++ bradykinesia 5/5 UE, 3/5 HF, elsewhere in LE 4/5, mild anterocollis  Reflexes:  2+ thoughout, except absent ankles Toes mute  Coordination:  Terminal and action tremor.  Gait:  Narrow based.  severe shuffling, turns poorly, ++ postural instability.  Impression/Recommendations:  1.  Parkinsonism - IPD vs. MSA vs drug induced( less likely)  - Given she is unwilling to really try other medications at this time, I will stick with the amantadine.  We will keep it at 100 bid. 2.  Severe insomnia - I am going to increase the melatonin by 1mg  pills to 4mg -> 6mg .  I unfortunately have not figure out why she is not sleeping, and she probably needs a sleep study to look for causes.  She may benefit from clonazepam if RBD is contributing.  She will follow up with my colleague Dr. Arbutus Leas given I will joining the epilepsy section at [facility].   Lupita Raider. Modesto Charon, MD [facility], [place]

## 2011-10-22 ENCOUNTER — Ambulatory Visit: Payer: Medicare Other | Admitting: Neurology

## 2011-11-01 ENCOUNTER — Telehealth: Payer: Self-pay | Admitting: Neurology

## 2011-11-01 NOTE — Telephone Encounter (Signed)
Picked up a call from the patient. She had a question about her Melatonin. Per Dr. Nash Dimmer last office note, it is ok to take up to 6 mg of the Melatonin. She had only been taking 4 mg. She will increase to 5 mg first to see if that helps. Asked that she call me next week with an update the medication. She states she will.

## 2011-11-06 ENCOUNTER — Other Ambulatory Visit: Payer: Self-pay | Admitting: Neurology

## 2011-12-21 ENCOUNTER — Telehealth: Payer: Self-pay

## 2011-12-21 NOTE — Telephone Encounter (Signed)
Pt wants form for lift chair filled out. Orthopedic doctor gave prescription for lift chair; advised pt to contact orthopedic doctor to fill out form for lift chair.

## 2011-12-26 ENCOUNTER — Encounter: Payer: Self-pay | Admitting: Neurology

## 2011-12-26 ENCOUNTER — Ambulatory Visit (INDEPENDENT_AMBULATORY_CARE_PROVIDER_SITE_OTHER): Payer: Medicare Other | Admitting: Neurology

## 2011-12-26 VITALS — BP 162/98 | HR 72 | Temp 97.5°F | Resp 16 | Wt 126.0 lb

## 2011-12-26 DIAGNOSIS — G20 Parkinson's disease: Secondary | ICD-10-CM | POA: Diagnosis not present

## 2011-12-26 MED ORDER — RIVASTIGMINE 4.6 MG/24HR TD PT24: 1.0000 | MEDICATED_PATCH | Freq: Every day | TRANSDERMAL | Status: DC

## 2011-12-26 NOTE — Progress Notes (Signed)
Mallory Perez was seen today in the movement disorders clinic for neurologic consultation at the request of [name], MD and Dr. [name].  Since [name] is no longer with the practice, I will be resuming the patient's care.  I had the opportunity to review Dr. Nash Dimmer notes.  The patient has also previously seen  Dr [name] with [name] and Dr. [name] at [facility] for the same problem.  She reports that she has seen 6 different neurologists.   She was discharged from [facility] for noncompliance.The consultation is for the evaluation of Parkinson's disease.  The patient is accompanied by her husband who supplements the history.  The pt reports that in 1999 she received celebrex for few days and not long thereafter, she began to tremor.  She thought that it was related to the celebrex but she was later given the dx of "parkinsonism."  The first symptom was tremor in the R hand but it can involve either hand.    Chief Complaint: The patient's primary symptom is decreased mobility, which is problematic esp for her husband. Specific Symptoms:  Tremor: yes, R hand greater than L Voice: yes, "weak" voice Sleep: insomnia, states that melatonin made worse and she is doing better than she was, but still would like something other than melatonin. Used to snore but that has gone.  Vivid Dreams:  no  Acting out dreams:  no Wet Pillows: no and no drooling during day Postural symptoms:  Improved with time, but always uses walker  Falls?  Last fall May 17,2013 and broke clavicle.  Multiple falls prior to that but none since.  Loss of smell:  no Loss of taste:  no Urinary Incontinence:  Only at night Difficulty Swallowing:  no Handwriting, micrographia: unknown, "I don't write" Trouble with ADL's:  Yes, needs assist with dressing/bathing  Trouble buttoning clothing:  yes Depression:  Yes, but better after ECT Memory changes:  no Hallucinations:  Yes, visual hallucinations, "spiders"...knows that they are not  real.  Saw Naoma Diener bears and grandchildren standing in bedroom.  Husband has to assist getting out of the chair because pt cannot stand on own.  Husband with back problems and has to carry her.  PT did not help her.  Neuroimaging has  previously been performed.  It is not available for my review today.  It was done 08/2002 and demonstrated small vessel disease.  PREVIOUS MEDICATIONS:  Amantadine since April, taking bid (thought increased shaking initially).  Sinemet (increased shaking), mirapex, requip, klonoipin. Never been on selegeline, ? Been on azilect.    Last PT:  07/2011 after broke clavicle (home PT for rehab), tries to do home PT by self  ALLERGIES:  No Known Allergies  CURRENT MEDICATIONS:  Current Outpatient Prescriptions on File Prior to Visit  Medication Sig Dispense Refill  . Amantadine HCl 100 MG tablet Take 100 mg by mouth See admin instructions. take 1 in the a.m. for 1 week, then increase to 1 twice a day from then on.      . atenolol (TENORMIN) 50 MG tablet TAKE 1/2 TABLET BY MOUTH TWICE A DAY  60 tablet  6  . [employer] MELATONIN 3 MG TABS TAKE 1 TABLET 2 HOURS BEFORE BEDTIME.  30 tablet  3  . fish oil-omega-3 fatty acids 1000 MG capsule Take 1 g by mouth daily.        Marland Kitchen docusate sodium (COLACE) 100 MG capsule Take 100 mg by mouth daily.      Marland Kitchen  Melatonin 3 MG TABS take 1 tablet 2 hours before bedtime.      Marland Kitchen DISCONTD: Amantadine HCl 100 MG tablet TAKE 1 IN THE A.M. FOR 1 WEEK, THEN INCREASE TO 1 TWICE A DAY FROM THEN ON.  60 tablet  3    PAST MEDICAL HISTORY:   Past Medical History  Diagnosis Date  . Depression   . Hypertension   . Myocardial infarction 1987  . Parkinson's disease   . Fall at home     PAST SURGICAL HISTORY:   Past Surgical History  Procedure Date  . Back surgery 2003 & 01/1989  . Abdominal hysterectomy 1995  . Bladder surgery 09/1988    SOCIAL HISTORY:   History   Social History  . Marital Status: Married    Spouse Name: N/A    Number of  Children: N/A  . Years of Education: N/A   Occupational History  . Not on file.   Social History Main Topics  . Smoking status: Never Smoker   . Smokeless tobacco: Never Used  . Alcohol Use: Yes     wine occasionally  . Drug Use: No  . Sexually Active: Not on file   Other Topics Concern  . Not on file   Social History Narrative  . No narrative on file    FAMILY HISTORY:   Family Status  Relation Status Death Age  . Mother Deceased   . Father      MI  . Other      MI    ROS: e/o LE edema.   A complete 10 system review of systems was obtained and was unremarkable apart from what is mentioned above.  PHYSICAL EXAMINATION:    VITALS:   Filed Vitals:   12/26/11 0951  BP: 162/98  Pulse: 72  Temp: 97.5 F (36.4 C)  Resp: 16  Weight: 126 lb (57.153 kg)    GEN:  The patient appears stated age and is in NAD. HEENT:  Normocephalic, atraumatic.  The mucous membranes are moist. The superficial temporal arteries are without ropiness or tenderness. CV:  RRR Lungs:  CTAB Neck/HEME:  There are no carotid bruits bilaterally. EXTS:  There is 2-3+ pitting edema in the b/l LE.  Neurological examination:  Orientation: The patient is alert and oriented x3. Fund of knowledge is appropriate.  Recent and remote memory are intact.  Attention and concentration are normal.    Able to name objects and repeat phrases. Cranial nerves: There is good facial symmetry but significant facial hypomimia with Stellwag's sign.  Pupils are equal round and reactive to light bilaterally. Fundoscopic exam is attempted but the disc margins are not well visualized bilaterally. Extraocular muscles are intact. The visual fields are full to confrontational testing. The speech is fluent and clear. Soft palate rises symmetrically and there is no tongue deviation. Hearing is intact to conversational tone. Sensation: Sensation is intact to light and pinprick throughout (facial, trunk, extremities). Vibration is  decreased at the bilateral ankle. There is no extinction with double simultaneous stimulation. There is no sensory dermatomal level identified. Motor: Strength is 5/5 in the bilateral upper and lower extremities.   Shoulder shrug is equal and symmetric.  There is no pronator drift. Deep tendon reflexes: Deep tendon reflexes are 0-1/4 at the bilateral biceps, triceps, brachioradialis, patella and achilles. Plantar responses are downgoing bilaterally.  Movement examination: Tone: There is significantly increased tone in the bilateral upper extremities.  The tone in the lower extremities is also increased.  Abnormal movements: There is a moderate resting tremor of the left upper and left lower extremity.  There is a mild resting tremor of the right upper extremity. Coordination:  There is significant decremation with RAM's, and there is a significant slowness 2 attempts with rapid alternating movements. Gait and Station: The patient is unable to arise out of the chair without assistance.. The patient's stride length is decreased and there is freezing, most prominent in the right lower extremity.  She is unable to ambulate unassisted.   ASSESSMENT:  1.  Parkinson's disease.   This is evidenced by significant rigidity, tremor, bradykinesia and postural instability.  I see no evidence that she has one of the Parkinson's plus syndromes as I see no unusual features of the disease (early falls , early urinary incontinence, early swallowing difficulties, apraxia of eyelid opening,etc).  However, I am seeing her quite far into the disease.  I would like to try to get a copy of her previous neurology records from [facility] and Dr. [name] at [facility].  Unfortunately, she has not been able to tolerate any of the anti-Parkinson's medications, which is largely the reason that she has so much rigidity. 2.  Insomnia.  This has actually gotten somewhat better. 3.  Lower extremity swelling.  I suspect that this may be a side effect  of the amantadine.  PLAN:   1. she will stop the amantadine for 2 weeks and then she will call me and let me know how she is doing in regards to the swallowing.  If she is better, then I do not think that it is worth restarting the amantadine.  If she is not, then I can confidently say that  the lower extremity edema is not related to Parkinson's or any type of Parkinson's medication. 2.she wanted a prescription for hydrochlorothiazide for the lower extremity edema, but I would rather see how the amantadine discontinuation trial dose.  If that does not help, then I would like to see her followup with her primary care physician regarding the lower extremity edema.   3.  I am going to place the patient on a trial of Exelon.  I am worried about the hallucinations, even though she considers these benign.  These quickly can become frightening with more florid psychotic behavior, and we will to avoid this at all costs.  She would like to try the Exelon patch.  She was given samples of 4.6 mg patch and shown how to use this.  Risks, benefits, side effects and alternative therapies were discussed.  The opportunity to ask questions was given and they were answered to the best of my ability.  The patient expressed understanding and willingness to follow the outlined treatment protocols. 4. I filled out her DME form for a lift chair. 5.  I would like her to attend a formal physical therapy for the Parkinson's, but they did not want to do that.  I encouraged daily exercise. 6.  We will potentially consider a trial of Seroquel in the future for hallucinations as well as insomnia. 7.  Return in about 4 months (around 04/27/2012). 8.  face-to-face time for this visit was 65 minutes.

## 2011-12-26 NOTE — Patient Instructions (Addendum)
Stop the Amantadine and call us in 2 weeks to let us know how your swelling is doing.  We will contact you in 3 months to schedule your 4 month follow up.   Medication Samples have been provided to the patient.  Drug name: Exelon Patch  Qty: 14  LOT: [id number]  Exp.Date: Mar 2014     Mallory Perez [facility] 11:43 AM 12/26/2011

## 2011-12-27 ENCOUNTER — Telehealth: Payer: Self-pay | Admitting: Neurology

## 2011-12-27 NOTE — Telephone Encounter (Signed)
Just d/c them.  I had the opportunity this AM to review notes from former neurologist.  Pt has "tried" virtually every med for PD, taken them only for a few days and said that she could not tolerate.  Please tell her and her husband that I have no further meds to try that she has not already tried.  I strongly, strongly encourage her to attend that PT that I told her about yesterday as that will be ONLY thing that helps her, but she needs to be faithful with it.  I am still willing to send referral if she would like.

## 2011-12-27 NOTE — Telephone Encounter (Signed)
Picked up a call from Mallory Perez. He reports that he put one of the Exelon patches that Dr. Arbutus Leas gave them on his wife last night around 6 pm. He states that she couldn't even get up this morning; that he had to help her. Can't get around as good as she could before the patch. States he took the patch off of her a few minutes ago; she asked him to as "she couldn't stand it any more." He is wanted to know what they should do now. I let him know that I would let Dr. Arbutus Leas know the situation and that we would be in touch. He says he is fine with this plan. Best call back number is [phone]. **Dr. Arbutus Leas please advise.

## 2011-12-27 NOTE — Telephone Encounter (Signed)
Called and spoke with Mr. Mallory Perez. Information given as per Dr. Arbutus Leas below. They will stop the patches as well as the Amantadine and call with an update in a couple of weeks re: lower extremity swelling improvement. No other concerns voiced at this time. Will think about the PT.

## 2012-01-02 ENCOUNTER — Telehealth: Payer: Self-pay | Admitting: Family Medicine

## 2012-01-02 NOTE — Telephone Encounter (Signed)
Caller: Ann/Child; Patient Name: Mallory Perez; PCP: [name]); Best Callback Phone Number: [phone].  Call regarding Feet Swellling, onset 4 weeks.  Pt was seen by Dr Tat on 10-9, Exelon was started and Amantadine stopped for 2 weeks.  Pt was not able to tolerate Exelon. Per Daughter, Dad called Dr Don Perking office, he was told there was nothing else that could be done. Per Dr Don Perking note, "she will stop amantadine for 2 weeks, If she is better, then I do not think that it is worth restarting the amantadine.  If she is not, then I can confidently say that  the lower extremity edema is not related to Parkinson's or any type of Parkinson's medication, she wanted a prescription for hydrochlorothiazide for the lower extremity edema, but I would rather see how the amantadine discontinuation trial dose.  If that does not help, then I would like to see her followup with her primary care physician regarding the lower extremity edema".    Daughter requesting hydrochlorothiazide from Dr Dayton Martes.  Pt is not w/ Daughter for complete assessment, daughter denies SOB or Coughing.  Appt scheduled on 10-17 at 9am  w/ Dr Dayton Martes for Edema elvaluation.  Daughter verbalized understanding.

## 2012-01-03 ENCOUNTER — Ambulatory Visit (INDEPENDENT_AMBULATORY_CARE_PROVIDER_SITE_OTHER): Payer: Medicare Other | Admitting: Family Medicine

## 2012-01-03 ENCOUNTER — Encounter: Payer: Self-pay | Admitting: Family Medicine

## 2012-01-03 VITALS — BP 190/102 | HR 84 | Temp 97.7°F

## 2012-01-03 DIAGNOSIS — R609 Edema, unspecified: Secondary | ICD-10-CM | POA: Diagnosis not present

## 2012-01-03 DIAGNOSIS — G20 Parkinson's disease: Secondary | ICD-10-CM | POA: Diagnosis not present

## 2012-01-03 DIAGNOSIS — R300 Dysuria: Secondary | ICD-10-CM

## 2012-01-03 DIAGNOSIS — R600 Localized edema: Secondary | ICD-10-CM | POA: Insufficient documentation

## 2012-01-03 LAB — COMPREHENSIVE METABOLIC PANEL
AST: 17 U/L (ref 0–37)
Albumin: 3.6 g/dL (ref 3.5–5.2)
Alkaline Phosphatase: 76 U/L (ref 39–117)
Glucose, Bld: 103 mg/dL — ABNORMAL HIGH (ref 70–99)
Potassium: 4.2 mEq/L (ref 3.5–5.1)
Sodium: 141 mEq/L (ref 135–145)
Total Bilirubin: 0.7 mg/dL (ref 0.3–1.2)
Total Protein: 6.6 g/dL (ref 6.0–8.3)

## 2012-01-03 LAB — CBC WITH DIFFERENTIAL/PLATELET
Eosinophils Relative: 2 % (ref 0.0–5.0)
HCT: 50.8 % — ABNORMAL HIGH (ref 36.0–46.0)
Hemoglobin: 16.8 g/dL — ABNORMAL HIGH (ref 12.0–15.0)
Lymphs Abs: 1.6 10*3/uL (ref 0.7–4.0)
MCV: 90.5 fl (ref 78.0–100.0)
Monocytes Absolute: 0.4 10*3/uL (ref 0.1–1.0)
Monocytes Relative: 6.7 % (ref 3.0–12.0)
Neutro Abs: 4 10*3/uL (ref 1.4–7.7)
Platelets: 168 10*3/uL (ref 150.0–400.0)
RDW: 14.3 % (ref 11.5–14.6)
WBC: 6.2 10*3/uL (ref 4.5–10.5)

## 2012-01-03 LAB — POCT URINALYSIS DIPSTICK
Bilirubin, UA: NEGATIVE
Blood, UA: NEGATIVE
Ketones, UA: NEGATIVE
pH, UA: 7

## 2012-01-03 LAB — TSH: TSH: 3.19 u[IU]/mL (ref 0.35–5.50)

## 2012-01-03 MED ORDER — HYDROCHLOROTHIAZIDE 12.5 MG PO CAPS: 12.5000 mg | ORAL_CAPSULE | Freq: Every day | ORAL | Status: DC

## 2012-01-03 NOTE — Patient Instructions (Addendum)
Good to see you. Please take your medication as soon as you get home.  Please call me in a few weeks with an update.  We will call you with your lab results.

## 2012-01-03 NOTE — Progress Notes (Signed)
78 yo with h/o severe depression and PD here for acute visit for LE edema with her daughter and husband.  She was seen by Dr Tat on 10-9- note reviewed.  Exelon was started and Amantadine stopped for 2 weeks. Pt was not able to tolerate Exelon.  Per her daughter, it "made her freeze- she is just now being able to move again."   Per Dr Don Perking note, "she will stop amantadine for 2 weeks, If she is better, then I do not think that it is worth restarting the amantadine. If she is not, then I can confidently say that the lower extremity edema is not related to Parkinson's or any type of Parkinson's medication, she wanted a prescription for hydrochlorothiazide for the lower extremity edema, but I would rather see how the amantadine discontinuation trial dose. If that does not help, then I would like to see her followup with her primary care physician regarding the lower extremity edema".   She has not yet restarted the amantadine.  She does want to restart it because she feels the swelling was not caused by the amantadine as she had been taking it for months and swelling only started 3 -4 weeks ago.  No CP or SOB.  BP elevated today but family says she has not had her medications yet today.  No HA or blurred vision.  Lab Results  Component Value Date   CREATININE 0.8 05/14/2011    Family brings in urine sample today.  Patient Active Problem List  Diagnosis  . HYPOKALEMIA  . UNSPECIFIED ANEMIA  . DEPRESSION  . PARKINSON'S DISEASE  . HYPERTENSION  . MYOCARDIAL INFARCTION, HX OF  . INSOMNIA  . ABNORMALITY OF GAIT  . CERUMEN IMPACTION, BILATERAL  . HEARING LOSS, BILATERAL  . Poor circulation of extremity  . Leg edema   Past Medical History  Diagnosis Date  . Depression     s/p ECT therapy  . Hypertension   . Myocardial infarction 1987  . Parkinson's disease   . Fall at home    Past Surgical History  Procedure Date  . Back surgery 2003 & 01/1989  . Abdominal hysterectomy 1995    . Bladder surgery 09/1988  . Electroconvulsive therapy    History  Substance Use Topics  . Smoking status: Never Smoker   . Smokeless tobacco: Never Used  . Alcohol Use: No   Family History  Problem Relation Age of Onset  . Angina Mother     and MI  . Alcohol abuse Other     brain tumor  . Diabetes Other    No Known Allergies Current Outpatient Prescriptions on File Prior to Visit  Medication Sig Dispense Refill  . Amantadine HCl 100 MG tablet Take 100 mg by mouth See admin instructions. take 1 in the a.m. for 1 week, then increase to 1 twice a day from then on.      . atenolol (TENORMIN) 50 MG tablet TAKE 1/2 TABLET BY MOUTH TWICE A DAY  60 tablet  6  . [facility] MELATONIN 3 MG TABS TAKE 1 TABLET 2 HOURS BEFORE BEDTIME.  30 tablet  3  . docusate sodium (COLACE) 100 MG capsule Take 100 mg by mouth daily.      . fish oil-omega-3 fatty acids 1000 MG capsule Take 1 g by mouth daily.        . Melatonin 3 MG TABS take 1 tablet 2 hours before bedtime.      . rivastigmine (EXELON) 4.6 mg/24hr Place  1 patch (4.6 mg total) onto the skin daily.  30 patch  6   The PMH, PSH, Social History, Family History, Medications, and allergies have been reviewed in [facility], and have been updated if relevant.  See HPI  Pos for insomnia Physical Exam  BP 190/102  Pulse 84  Temp 97.7 F (36.5 C) BP Readings from Last 3 Encounters:  01/03/12 190/102  12/26/11 162/98  10/17/11 138/86     General: alert, well-developed, well-nourished, and well-hydrated. NAD sitting quietly in wheel chair  Head: normocephalic, atraumatic, and no abnormalities observed.  Eyes: pupils equal, pupils round, and no injection.  Ears: R ear normal and L ear normal.  Nose: no external deformity.  Mouth: no gingival abnormalities.  Lungs: normal respiratory effort, no intercostal retractions, no accessory muscle use, and normal breath sounds.  Heart: occasional extra beat--no pattern noted, no murmur heard  Abdomen: soft,  non-tender, normal bowel sounds, and no guarding.  Msk: able to raise both arms above her head slowly  Neurologic: alert & oriented X3,-tremors in both hands at rest, less so with intention  Ext:  Purplish discoloration of feet bilaterally with some spider veins and prominent veins, pos pedal pulses (dimished but equal bilaterally/baseline) 1+ pitting edema bilaterally Psych: normally interactive, flat affect, and subdued.    Assessment and Plan:  1. Leg edema  New- she has seen some mild improvement. Her BP certainly could tolerate low dose HCTZ- start 12.5 mg daily. Also will check some labs today. The patient indicates understanding of these issues and agrees with the plan.  Comprehensive metabolic panel, TSH, CBC with Differential  2. PARKINSON'S DISEASE  Followed by Dr. Arbutus Leas.  I advised them to call Dr. Arbutus Leas to let her know they stopped the exelon and will be restarting the amantadine.

## 2012-01-05 LAB — URINE CULTURE

## 2012-01-15 ENCOUNTER — Telehealth: Payer: Self-pay | Admitting: Neurology

## 2012-01-15 NOTE — Telephone Encounter (Signed)
Picked up a call from [place]. She is calling because "Dr. Arbutus Leas told me to call her and tell her how I was doing." Patient saw Dr. Arbutus Leas 10/09 and she stopped her Amantadine and asked that she call us with an update in 2 weeks. First of all, she saw Dr. Dayton Martes on 10/17 and he put her on HCTZ and she reports that the swelling has completely gone in her legs and feet. She states she feels so much better. She is even walking to the BR with her walker now by herself. She restarted the Amantadine and says she takes 1/2 tab (50 mg) twice a day and is doing good on that dose. Nikaya denies any problems swallowing, no choking or coughing and denies any weight loss. I thanked her for calling with the update and told her to call if needed before her revisit. The patient states she will. **Dr. Arbutus Leas, just a FYI....Marland KitchenMarland Kitchen

## 2012-03-24 ENCOUNTER — Other Ambulatory Visit: Payer: Self-pay | Admitting: Family Medicine

## 2012-03-24 DIAGNOSIS — Z1231 Encounter for screening mammogram for malignant neoplasm of breast: Secondary | ICD-10-CM

## 2012-04-01 ENCOUNTER — Telehealth: Payer: Self-pay | Admitting: Neurology

## 2012-04-01 NOTE — Telephone Encounter (Signed)
Had a verbal message from [place], front desk staff, to call this patient re: her medication. I called the patient and she was requesting a medication refill on her Amantadine 100 mg tabs. She states she is taking one tab BID and is doing ok with it. She was not able to tolerate the Exelon patch stating she "could not walk for days after wearing it for a few hours."  She states she is not having any swelling of her lower extremities as was the case in Oct. 2013.  She is no longer taking the HCTZ as prescribed by Dr. Dayton Martes. She did not have a follow up appointment scheduled and was to come in early Feb. So I made her an appointment for 04/21/12 at 11:00 am. I told her I would check with Dr. Arbutus Leas about the refill for her medication. [facility] [address].  **Dr. Arbutus Leas, please advise.

## 2012-04-01 NOTE — Telephone Encounter (Signed)
Yes you may refill the medication for amantadine, 100 mg bid, #60 with 4 RF.  Thank you

## 2012-04-02 ENCOUNTER — Other Ambulatory Visit: Payer: Self-pay | Admitting: Neurology

## 2012-04-02 MED ORDER — AMANTADINE HCL 100 MG PO TABS: ORAL_TABLET | ORAL | Status: DC

## 2012-04-02 NOTE — Telephone Encounter (Signed)
E[facility]scribed [facility] [address]

## 2012-04-19 ENCOUNTER — Encounter (HOSPITAL_COMMUNITY): Payer: Self-pay | Admitting: Certified Registered"

## 2012-04-19 ENCOUNTER — Emergency Department (HOSPITAL_COMMUNITY): Payer: Medicare Other

## 2012-04-19 ENCOUNTER — Inpatient Hospital Stay (HOSPITAL_COMMUNITY): Payer: Medicare Other | Admitting: Certified Registered"

## 2012-04-19 ENCOUNTER — Encounter (HOSPITAL_COMMUNITY): Payer: Self-pay | Admitting: Emergency Medicine

## 2012-04-19 ENCOUNTER — Inpatient Hospital Stay (HOSPITAL_COMMUNITY)
Admission: EM | Admit: 2012-04-19 | Discharge: 2012-04-22 | DRG: 470 | Disposition: A | Discharge status: Skilled Nursing Facility | Payer: Medicare Other | Attending: Internal Medicine | Admitting: Internal Medicine

## 2012-04-19 ENCOUNTER — Inpatient Hospital Stay (HOSPITAL_COMMUNITY): Payer: Medicare Other

## 2012-04-19 ENCOUNTER — Encounter (HOSPITAL_COMMUNITY)
Admission: EM | Disposition: A | Discharge status: Skilled Nursing Facility | Payer: Self-pay | Source: Home / Self Care | Attending: Internal Medicine

## 2012-04-19 DIAGNOSIS — Y92009 Unspecified place in unspecified non-institutional (private) residence as the place of occurrence of the external cause: Secondary | ICD-10-CM

## 2012-04-19 DIAGNOSIS — Z96649 Presence of unspecified artificial hip joint: Secondary | ICD-10-CM | POA: Diagnosis not present

## 2012-04-19 DIAGNOSIS — R269 Unspecified abnormalities of gait and mobility: Secondary | ICD-10-CM | POA: Diagnosis not present

## 2012-04-19 DIAGNOSIS — S72009A Fracture of unspecified part of neck of unspecified femur, initial encounter for closed fracture: Principal | ICD-10-CM | POA: Diagnosis present

## 2012-04-19 DIAGNOSIS — M25559 Pain in unspecified hip: Secondary | ICD-10-CM | POA: Diagnosis not present

## 2012-04-19 DIAGNOSIS — S62309A Unspecified fracture of unspecified metacarpal bone, initial encounter for closed fracture: Secondary | ICD-10-CM | POA: Diagnosis not present

## 2012-04-19 DIAGNOSIS — F329 Major depressive disorder, single episode, unspecified: Secondary | ICD-10-CM | POA: Diagnosis present

## 2012-04-19 DIAGNOSIS — I10 Essential (primary) hypertension: Secondary | ICD-10-CM | POA: Diagnosis present

## 2012-04-19 DIAGNOSIS — M8448XD Pathological fracture, other site, subsequent encounter for fracture with routine healing: Secondary | ICD-10-CM | POA: Diagnosis not present

## 2012-04-19 DIAGNOSIS — I252 Old myocardial infarction: Secondary | ICD-10-CM | POA: Diagnosis not present

## 2012-04-19 DIAGNOSIS — R262 Difficulty in walking, not elsewhere classified: Secondary | ICD-10-CM | POA: Diagnosis not present

## 2012-04-19 DIAGNOSIS — R0989 Other specified symptoms and signs involving the circulatory and respiratory systems: Secondary | ICD-10-CM | POA: Diagnosis not present

## 2012-04-19 DIAGNOSIS — M899 Disorder of bone, unspecified: Secondary | ICD-10-CM | POA: Diagnosis not present

## 2012-04-19 DIAGNOSIS — W19XXXA Unspecified fall, initial encounter: Secondary | ICD-10-CM

## 2012-04-19 DIAGNOSIS — Z01818 Encounter for other preprocedural examination: Secondary | ICD-10-CM | POA: Diagnosis not present

## 2012-04-19 DIAGNOSIS — Z79899 Other long term (current) drug therapy: Secondary | ICD-10-CM | POA: Diagnosis not present

## 2012-04-19 DIAGNOSIS — W010XXA Fall on same level from slipping, tripping and stumbling without subsequent striking against object, initial encounter: Secondary | ICD-10-CM | POA: Diagnosis present

## 2012-04-19 DIAGNOSIS — Z7901 Long term (current) use of anticoagulants: Secondary | ICD-10-CM

## 2012-04-19 DIAGNOSIS — S62339A Displaced fracture of neck of unspecified metacarpal bone, initial encounter for closed fracture: Secondary | ICD-10-CM | POA: Diagnosis present

## 2012-04-19 DIAGNOSIS — G20A1 Parkinson's disease without dyskinesia, without mention of fluctuations: Secondary | ICD-10-CM | POA: Diagnosis present

## 2012-04-19 DIAGNOSIS — R5381 Other malaise: Secondary | ICD-10-CM | POA: Diagnosis present

## 2012-04-19 DIAGNOSIS — F3289 Other specified depressive episodes: Secondary | ICD-10-CM | POA: Diagnosis present

## 2012-04-19 DIAGNOSIS — I2510 Atherosclerotic heart disease of native coronary artery without angina pectoris: Secondary | ICD-10-CM | POA: Diagnosis present

## 2012-04-19 DIAGNOSIS — S72001A Fracture of unspecified part of neck of right femur, initial encounter for closed fracture: Secondary | ICD-10-CM | POA: Diagnosis present

## 2012-04-19 DIAGNOSIS — S62306A Unspecified fracture of fifth metacarpal bone, right hand, initial encounter for closed fracture: Secondary | ICD-10-CM | POA: Diagnosis present

## 2012-04-19 DIAGNOSIS — R6889 Other general symptoms and signs: Secondary | ICD-10-CM | POA: Diagnosis not present

## 2012-04-19 DIAGNOSIS — D649 Anemia, unspecified: Secondary | ICD-10-CM | POA: Diagnosis present

## 2012-04-19 DIAGNOSIS — G20 Parkinson's disease: Secondary | ICD-10-CM | POA: Diagnosis present

## 2012-04-19 DIAGNOSIS — S72019A Unspecified intracapsular fracture of unspecified femur, initial encounter for closed fracture: Secondary | ICD-10-CM | POA: Diagnosis not present

## 2012-04-19 DIAGNOSIS — M6281 Muscle weakness (generalized): Secondary | ICD-10-CM | POA: Diagnosis not present

## 2012-04-19 DIAGNOSIS — M79609 Pain in unspecified limb: Secondary | ICD-10-CM | POA: Diagnosis not present

## 2012-04-19 DIAGNOSIS — Z66 Do not resuscitate: Secondary | ICD-10-CM | POA: Diagnosis present

## 2012-04-19 DIAGNOSIS — S62319A Displaced fracture of base of unspecified metacarpal bone, initial encounter for closed fracture: Secondary | ICD-10-CM | POA: Diagnosis not present

## 2012-04-19 DIAGNOSIS — Z471 Aftercare following joint replacement surgery: Secondary | ICD-10-CM | POA: Diagnosis not present

## 2012-04-19 DIAGNOSIS — Z9181 History of falling: Secondary | ICD-10-CM | POA: Diagnosis not present

## 2012-04-19 DIAGNOSIS — Z4801 Encounter for change or removal of surgical wound dressing: Secondary | ICD-10-CM | POA: Diagnosis not present

## 2012-04-19 DIAGNOSIS — S7290XA Unspecified fracture of unspecified femur, initial encounter for closed fracture: Secondary | ICD-10-CM | POA: Diagnosis not present

## 2012-04-19 DIAGNOSIS — R279 Unspecified lack of coordination: Secondary | ICD-10-CM | POA: Diagnosis not present

## 2012-04-19 HISTORY — PX: HIP ARTHROPLASTY: SHX981

## 2012-04-19 LAB — COMPREHENSIVE METABOLIC PANEL
ALT: 19 U/L (ref 0–35)
Alkaline Phosphatase: 100 U/L (ref 39–117)
CO2: 26 mEq/L (ref 19–32)
Chloride: 104 mEq/L (ref 96–112)
GFR calc Af Amer: >90 mL/min (ref >90)
GFR calc non Af Amer: 81 mL/min — ABNORMAL LOW (ref >90)
Glucose, Bld: 119 mg/dL — ABNORMAL HIGH (ref 70–99)
Potassium: 3.8 mEq/L (ref 3.5–5.1)
Sodium: 140 mEq/L (ref 135–145)
Total Bilirubin: 1.1 mg/dL (ref 0.3–1.2)
Total Protein: 6.4 g/dL (ref 6.0–8.3)

## 2012-04-19 LAB — SAMPLE TO BLOOD BANK

## 2012-04-19 LAB — CBC
HCT: 48.8 % — ABNORMAL HIGH (ref 36.0–46.0)
Hemoglobin: 16.3 g/dL — ABNORMAL HIGH (ref 12.0–15.0)
RBC: 5.43 MIL/uL — ABNORMAL HIGH (ref 3.87–5.11)
WBC: 10.3 10*3/uL (ref 4.0–10.5)

## 2012-04-19 SURGERY — HEMIARTHROPLASTY, HIP, DIRECT ANTERIOR APPROACH, FOR FRACTURE
Anesthesia: General | Site: Hip | Laterality: Right | Wound class: Clean

## 2012-04-19 MED ORDER — ACETAMINOPHEN 10 MG/ML IV SOLN
INTRAVENOUS | Status: DC | PRN
  Administered 2012-04-19: 1000 mg via INTRAVENOUS

## 2012-04-19 MED ORDER — METOCLOPRAMIDE HCL 10 MG PO TABS: 5.0000 mg | ORAL_TABLET | Freq: Three times a day (TID) | ORAL | Status: DC | PRN

## 2012-04-19 MED ORDER — FENTANYL CITRATE 0.05 MG/ML IJ SOLN
INTRAMUSCULAR | Status: DC | PRN
  Administered 2012-04-19: 25 ug via INTRAVENOUS
  Administered 2012-04-19: 50 ug via INTRAVENOUS
  Administered 2012-04-19: 25 ug via INTRAVENOUS
  Administered 2012-04-19: 100 ug via INTRAVENOUS

## 2012-04-19 MED ORDER — AMANTADINE HCL 100 MG PO CAPS: 50.0000 mg | ORAL_CAPSULE | Freq: Two times a day (BID) | ORAL | Status: DC

## 2012-04-19 MED ORDER — BISACODYL 5 MG PO TBEC
5.0000 mg | DELAYED_RELEASE_TABLET | Freq: Every day | ORAL | Status: DC | PRN
  Administered 2012-04-21: 5 mg via ORAL
  Filled 2012-04-19: qty 1

## 2012-04-19 MED ORDER — HYDROMORPHONE HCL PF 1 MG/ML IJ SOLN
0.2500 mg | INTRAMUSCULAR | Status: DC | PRN
  Administered 2012-04-19 (×2): 0.5 mg via INTRAVENOUS

## 2012-04-19 MED ORDER — CEFAZOLIN SODIUM-DEXTROSE 2-3 GM-% IV SOLR
2.0000 g | Freq: Four times a day (QID) | INTRAVENOUS | Status: AC
  Administered 2012-04-19 – 2012-04-20 (×2): 2 g via INTRAVENOUS
  Filled 2012-04-19 (×2): qty 50

## 2012-04-19 MED ORDER — LACTATED RINGERS IV SOLN
INTRAVENOUS | Status: DC | PRN
  Administered 2012-04-19 (×2): via INTRAVENOUS

## 2012-04-19 MED ORDER — DOCUSATE SODIUM 100 MG PO CAPS
100.0000 mg | ORAL_CAPSULE | Freq: Two times a day (BID) | ORAL | Status: DC
  Administered 2012-04-20 – 2012-04-22 (×5): 100 mg via ORAL
  Filled 2012-04-19 (×6): qty 1

## 2012-04-19 MED ORDER — LIDOCAINE HCL 4 % MT SOLN
OROMUCOSAL | Status: DC | PRN
  Administered 2012-04-19: 4 mL via TOPICAL

## 2012-04-19 MED ORDER — MORPHINE SULFATE 2 MG/ML IJ SOLN: 0.5000 mg | INTRAMUSCULAR | Status: DC | PRN

## 2012-04-19 MED ORDER — METHOCARBAMOL 500 MG PO TABS
500.0000 mg | ORAL_TABLET | Freq: Four times a day (QID) | ORAL | Status: DC | PRN
  Administered 2012-04-20: 500 mg via ORAL
  Filled 2012-04-19: qty 1

## 2012-04-19 MED ORDER — ACETAMINOPHEN 325 MG PO TABS
650.0000 mg | ORAL_TABLET | Freq: Four times a day (QID) | ORAL | Status: DC | PRN
  Administered 2012-04-20 – 2012-04-21 (×2): 650 mg via ORAL
  Filled 2012-04-19 (×2): qty 2

## 2012-04-19 MED ORDER — MORPHINE SULFATE 2 MG/ML IJ SOLN: 2.0000 mg | Freq: Once | INTRAMUSCULAR | Status: DC

## 2012-04-19 MED ORDER — ACETAMINOPHEN 10 MG/ML IV SOLN
1000.0000 mg | Freq: Four times a day (QID) | INTRAVENOUS | Status: DC
  Administered 2012-04-19: 1000 mg via INTRAVENOUS
  Filled 2012-04-19 (×4): qty 100

## 2012-04-19 MED ORDER — ACETAMINOPHEN 10 MG/ML IV SOLN
1000.0000 mg | Freq: Once | INTRAVENOUS | Status: AC
  Filled 2012-04-19: qty 100

## 2012-04-19 MED ORDER — ALUM & MAG HYDROXIDE-SIMETH 200-200-20 MG/5ML PO SUSP: 30.0000 mL | ORAL | Status: DC | PRN

## 2012-04-19 MED ORDER — TETANUS-DIPHTH-ACELL PERTUSSIS 5-2.5-18.5 LF-MCG/0.5 IM SUSP
0.5000 mL | Freq: Once | INTRAMUSCULAR | Status: AC
  Administered 2012-04-19: 0.5 mL via INTRAMUSCULAR
  Filled 2012-04-19: qty 0.5

## 2012-04-19 MED ORDER — ONDANSETRON HCL 4 MG/2ML IJ SOLN: 4.0000 mg | Freq: Once | INTRAMUSCULAR | Status: AC | PRN

## 2012-04-19 MED ORDER — ZOLPIDEM TARTRATE 5 MG PO TABS: 5.0000 mg | ORAL_TABLET | Freq: Every evening | ORAL | Status: DC | PRN

## 2012-04-19 MED ORDER — ONDANSETRON HCL 4 MG PO TABS: 4.0000 mg | ORAL_TABLET | Freq: Four times a day (QID) | ORAL | Status: DC | PRN

## 2012-04-19 MED ORDER — ONDANSETRON HCL 4 MG/2ML IJ SOLN: 4.0000 mg | Freq: Four times a day (QID) | INTRAMUSCULAR | Status: DC | PRN

## 2012-04-19 MED ORDER — FLEET ENEMA 7-19 GM/118ML RE ENEM: 1.0000 | ENEMA | Freq: Once | RECTAL | Status: AC | PRN

## 2012-04-19 MED ORDER — ATENOLOL 25 MG PO TABS
25.0000 mg | ORAL_TABLET | Freq: Every evening | ORAL | Status: DC
  Administered 2012-04-20 – 2012-04-21 (×2): 25 mg via ORAL
  Filled 2012-04-19 (×4): qty 1

## 2012-04-19 MED ORDER — SENNOSIDES-DOCUSATE SODIUM 8.6-50 MG PO TABS: 1.0000 | ORAL_TABLET | Freq: Every evening | ORAL | Status: DC | PRN

## 2012-04-19 MED ORDER — HYDROCODONE-ACETAMINOPHEN 5-325 MG PO TABS
1.0000 | ORAL_TABLET | Freq: Four times a day (QID) | ORAL | Status: DC | PRN
  Administered 2012-04-20 – 2012-04-21 (×4): 1 via ORAL
  Filled 2012-04-19 (×2): qty 1
  Filled 2012-04-19: qty 2
  Filled 2012-04-19: qty 1

## 2012-04-19 MED ORDER — ENOXAPARIN SODIUM 40 MG/0.4ML ~~LOC~~ SOLN
40.0000 mg | SUBCUTANEOUS | Status: DC
  Administered 2012-04-20 – 2012-04-22 (×3): 40 mg via SUBCUTANEOUS
  Filled 2012-04-19 (×4): qty 0.4

## 2012-04-19 MED ORDER — CHLORHEXIDINE GLUCONATE 4 % EX LIQD
60.0000 mL | Freq: Once | CUTANEOUS | Status: AC
  Administered 2012-04-19: 4 via TOPICAL
  Filled 2012-04-19: qty 60

## 2012-04-19 MED ORDER — AMANTADINE HCL 100 MG PO CAPS
100.0000 mg | ORAL_CAPSULE | Freq: Two times a day (BID) | ORAL | Status: DC
  Administered 2012-04-20 – 2012-04-22 (×5): 100 mg via ORAL
  Filled 2012-04-19 (×8): qty 1

## 2012-04-19 MED ORDER — ACETAMINOPHEN 650 MG RE SUPP: 650.0000 mg | Freq: Four times a day (QID) | RECTAL | Status: DC | PRN

## 2012-04-19 MED ORDER — LIDOCAINE HCL (CARDIAC) 20 MG/ML IV SOLN
INTRAVENOUS | Status: DC | PRN
  Administered 2012-04-19: 80 mg via INTRAVENOUS

## 2012-04-19 MED ORDER — METHOCARBAMOL 100 MG/ML IJ SOLN
500.0000 mg | Freq: Four times a day (QID) | INTRAVENOUS | Status: DC | PRN
  Filled 2012-04-19: qty 5

## 2012-04-19 MED ORDER — CEFAZOLIN SODIUM-DEXTROSE 2-3 GM-% IV SOLR
2.0000 g | INTRAVENOUS | Status: AC
  Administered 2012-04-19: 2 g via INTRAVENOUS
  Filled 2012-04-19: qty 50

## 2012-04-19 MED ORDER — HYDROCODONE-ACETAMINOPHEN 5-325 MG PO TABS: 1.0000 | ORAL_TABLET | Freq: Four times a day (QID) | ORAL | Status: DC | PRN

## 2012-04-19 MED ORDER — PROPOFOL 10 MG/ML IV BOLUS
INTRAVENOUS | Status: DC | PRN
  Administered 2012-04-19: 50 mg via INTRAVENOUS

## 2012-04-19 MED ORDER — METOCLOPRAMIDE HCL 5 MG/ML IJ SOLN: 5.0000 mg | Freq: Three times a day (TID) | INTRAMUSCULAR | Status: DC | PRN

## 2012-04-19 MED ORDER — ONDANSETRON HCL 4 MG/2ML IJ SOLN: 4.0000 mg | Freq: Once | INTRAMUSCULAR | Status: DC

## 2012-04-19 MED ORDER — SODIUM CHLORIDE 0.9 % IV SOLN
INTRAVENOUS | Status: DC
  Administered 2012-04-19: 10 mL/h via INTRAVENOUS

## 2012-04-19 MED ORDER — ROCURONIUM BROMIDE 100 MG/10ML IV SOLN
INTRAVENOUS | Status: DC | PRN
  Administered 2012-04-19 (×2): 20 mg via INTRAVENOUS

## 2012-04-19 MED ORDER — SODIUM CHLORIDE 0.9 % IV SOLN
INTRAVENOUS | Status: DC
  Administered 2012-04-19 – 2012-04-20 (×2): via INTRAVENOUS

## 2012-04-19 MED ORDER — PHENOL 1.4 % MT LIQD: 1.0000 | OROMUCOSAL | Status: DC | PRN

## 2012-04-19 MED ORDER — MENTHOL 3 MG MT LOZG: 1.0000 | LOZENGE | OROMUCOSAL | Status: DC | PRN

## 2012-04-19 MED ORDER — ONDANSETRON HCL 4 MG/2ML IJ SOLN: 4.0000 mg | Freq: Three times a day (TID) | INTRAMUSCULAR | Status: DC | PRN

## 2012-04-19 MED ORDER — MORPHINE SULFATE 2 MG/ML IJ SOLN
0.5000 mg | INTRAMUSCULAR | Status: DC | PRN
  Administered 2012-04-19: 0.5 mg via INTRAVENOUS
  Filled 2012-04-19: qty 1

## 2012-04-19 SURGICAL SUPPLY — 48 items
BLADE SAW RECIP 87.9 MT (BLADE) IMPLANT
BLADE SAW SAG 73X25 THK (BLADE)
BLADE SAW SGTL 73X25 THK (BLADE) IMPLANT
BNDG COHESIVE 6X5 TAN STRL LF (GAUZE/BANDAGES/DRESSINGS) ×2 IMPLANT
CLOTH BEACON ORANGE TIMEOUT ST (SAFETY) ×2 IMPLANT
COVER BACK TABLE 24X17X13 BIG (DRAPES) IMPLANT
COVER SURGICAL LIGHT HANDLE (MISCELLANEOUS) ×2 IMPLANT
DRAPE INCISE IOBAN 66X45 STRL (DRAPES) ×2 IMPLANT
DRAPE ORTHO SPLIT 77X108 STRL (DRAPES) ×2
DRAPE SURG ORHT 6 SPLT 77X108 (DRAPES) ×2 IMPLANT
DRAPE U-SHAPE 47X51 STRL (DRAPES) ×2 IMPLANT
DRILL BIT 7/64X5 (BIT) IMPLANT
DRSG ADAPTIC 3X8 NADH LF (GAUZE/BANDAGES/DRESSINGS) IMPLANT
DRSG PAD ABDOMINAL 8X10 ST (GAUZE/BANDAGES/DRESSINGS) ×2 IMPLANT
DURAPREP 26ML APPLICATOR (WOUND CARE) ×2 IMPLANT
ELECT CAUTERY BLADE 6.4 (BLADE) ×2 IMPLANT
ELECT REM PT RETURN 9FT ADLT (ELECTROSURGICAL) ×2
ELECTRODE REM PT RTRN 9FT ADLT (ELECTROSURGICAL) ×1 IMPLANT
GLOVE BIOGEL PI IND STRL 7.5 (GLOVE) IMPLANT
GLOVE BIOGEL PI IND STRL 8.5 (GLOVE) ×2 IMPLANT
GLOVE BIOGEL PI INDICATOR 7.5 (GLOVE)
GLOVE BIOGEL PI INDICATOR 8.5 (GLOVE) ×2
GLOVE ECLIPSE 9.0 STRL (GLOVE) IMPLANT
GLOVE SURG ORTHO 8.0 STRL STRW (GLOVE) ×4 IMPLANT
GOWN PREVENTION PLUS XLARGE (GOWN DISPOSABLE) ×2 IMPLANT
GOWN STRL NON-REIN LRG LVL3 (GOWN DISPOSABLE) ×4 IMPLANT
HOOD PEEL AWAY FACE SHEILD DIS (HOOD) ×4 IMPLANT
KIT BASIN OR (CUSTOM PROCEDURE TRAY) ×2 IMPLANT
KIT ROOM TURNOVER OR (KITS) ×2 IMPLANT
MANIFOLD NEPTUNE II (INSTRUMENTS) ×2 IMPLANT
NDL SUT 2 .5 CRC MAYO 1.732X (NEEDLE) IMPLANT
NEEDLE MAYO TAPER (NEEDLE)
NS IRRIG 1000ML POUR BTL (IV SOLUTION) ×2 IMPLANT
PACK TOTAL JOINT (CUSTOM PROCEDURE TRAY) ×2 IMPLANT
PAD ARMBOARD 7.5X6 YLW CONV (MISCELLANEOUS) ×4 IMPLANT
PASSER SUT SWANSON 36MM LOOP (INSTRUMENTS) ×2 IMPLANT
PILLOW ABDUCTION HIP (SOFTGOODS) IMPLANT
SPONGE GAUZE 4X4 12PLY (GAUZE/BANDAGES/DRESSINGS) ×2 IMPLANT
STAPLER VISISTAT 35W (STAPLE) ×2 IMPLANT
SUCTION FRAZIER TIP 10 FR DISP (SUCTIONS) ×2 IMPLANT
SUT ETHIBOND NAB CT1 #1 30IN (SUTURE) ×6 IMPLANT
SUT VIC AB 0 CTX 27 (SUTURE) ×2 IMPLANT
SUT VIC AB 1 CTB1 27 (SUTURE) ×2 IMPLANT
SUT VIC AB 2-0 CTB1 (SUTURE) ×2 IMPLANT
TOWEL OR 17X24 6PK STRL BLUE (TOWEL DISPOSABLE) ×2 IMPLANT
TOWEL OR 17X26 10 PK STRL BLUE (TOWEL DISPOSABLE) ×2 IMPLANT
TRAY FOLEY CATH 14FR (SET/KITS/TRAYS/PACK) ×2 IMPLANT
WATER STERILE IRR 1000ML POUR (IV SOLUTION) IMPLANT

## 2012-04-19 NOTE — Consult Note (Signed)
NAME: Mallory Perez MRN:   [id number] DOB:   [birth year]   CHIEF COMPLAINT:  Right hip and right hand pain.  HISTORY:   Mallory Perez a 79 y.o. female  with right  Hip Pain Patient complains of right hip pain. Onset of the symptoms was yesterday. Inciting event: fell while walking.. The patient reports the hip pain is worse with weight bearing. Associated symptoms: none, except some hand pain after the fall as well. Aggravating symptoms include: any weight bearing. Patient has had no prior hip problems. Previous visits for this problem: none. Evaluation to date: none. Treatment to date: none.   PAST MEDICAL HISTORY:   Past Medical History  Diagnosis Date  . Depression     s/p ECT therapy  . Hypertension   . Myocardial infarction 1987  . Parkinson's disease   . Fall at home     PAST SURGICAL HISTORY:   Past Surgical History  Procedure Date  . Back surgery 2003 & 01/1989  . Abdominal hysterectomy 1995  . Bladder surgery 09/1988  . Electroconvulsive therapy     MEDICATIONS:   (Not in a hospital admission)  ALLERGIES:  No Known Allergies  REVIEW OF SYSTEMS:   Negative except current.  FAMILY HISTORY:   Family History  Problem Relation Age of Onset  . Angina Mother     and MI  . Alcohol abuse Other     brain tumor  . Diabetes Other     SOCIAL HISTORY:   reports that she has never smoked. She has never used smokeless tobacco. She reports that she does not drink alcohol or use illicit drugs.  PHYSICAL EXAM:  General appearance: alert, cooperative, appears stated age and no distress   The right hand has bruising and swelling but is NV intact.  The right hip has pain with any ROM.  The leg is shortened and externally rotated.  The extremity is NV intact.   LABORATORY STUDIES:  Basename 04/19/12 0934  WBC 10.3  HGB 16.3*  HCT 48.8*  PLT 154     Basename 04/19/12 0934  NA 140  K 3.8  CL 104  CO2 26  GLUCOSE 119*  BUN 19  CREATININE 0.69  CALCIUM 9.1     STUDIES/RESULTS:  Dg Chest 1 View  04/19/2012  *RADIOLOGY REPORT*  Clinical Data: Preoperative evaluation, hip fracture  CHEST - 1 VIEW  Comparison: None.  Findings: Atherosclerotic and tortuous thoracic aorta.  The cardiac size is within normal limits.  Prominence of the right paratracheal stripe likely reflects underlying vascular structures.  There is mild central vascular congestion without overt edema.  No pneumothorax, pleural effusion or focal airspace consolidation. The bones diffusely osteopenic.  Mild dextroconvex scoliosis of the thoracic spine.  Incompletely imaged posterior lumbar fixation hardware.  IMPRESSION:  1.  No acute cardiopulmonary disease. 2.  Aortic atherosclerosis. 3.  Mild pulmonary vascular congestion without edema.   Original Report Authenticated By: Malachy Moan, M.D.    Dg Hip Complete Right  04/19/2012  *RADIOLOGY REPORT*  Clinical Data: Post fall, now with right hip pain  RIGHT HIP - COMPLETE 2+ VIEW  Comparison: None.  Findings:  There is a minimally displaced fracture of the right femoral neck with foreshortening and angulation.  No definite pelvic fracture. Limited visualization of the contralateral left hip is normal. Post surgical change of the lower lumbar spine, incompletely evaluated.  Multiple phleboliths overlie the lower pelvis.  No radiopaque foreign body.  IMPRESSION: Minimally displaced fracture of  the right femoral neck with foreshortening and angulation.   Original Report Authenticated By: Tacey Ruiz, MD    Dg Hand Complete Right  04/19/2012  *RADIOLOGY REPORT*  Clinical Data: Post fall, now with hand pain, especially the middle finger  RIGHT HAND - COMPLETE 3+ VIEW  Comparison: None.  Findings:  Diffuse osteopenia.  There is mild windswept deformity of all the MCP joints with varus angulation.  There is a minimally displaced fracture of the distal metaphysis of the fifth metacarpal with angulation.  Evaluation of the adjacent fifth MCP joint is  degraded secondary to obliquity.  No definite fracture of the middle finger though evaluation degraded secondary to obliquity.  Mild diffuse soft tissue swelling especially about the ulnar aspect of the hand.  No radiopaque foreign body.  IMPRESSION: 1.  Minimally displaced fracture of the distal metaphysis of the fifth metacarpal.  Evaluation of the adjacent MCP joint is degraded secondary to obliquity.  2. No definite fracture of the middle digit though note, evaluation degraded secondary to obliquity.  Further evaluation with dedicated radiographs of this digit may be performed as clinically indicated.   Original Report Authenticated By: Tacey Ruiz, MD     ASSESSMENT: Right 5th Metacarpal Fracture.  Right Femoral Neck Fracture  PLAN:  1.  Ulnar gutter splint right hand 2.  Medical admission and clearance 3.  Right hip hemiarthroplasty when cleared.    Kanyia Heaslip,STEPHEN D 04/19/2012. 12:16 PM [phone]

## 2012-04-19 NOTE — Anesthesia Procedure Notes (Signed)
Procedure Name: Intubation Date/Time: 04/19/2012 5:33 PM Performed by: Ellin Goodie Pre-anesthesia Checklist: Patient identified, Emergency Drugs available, Suction available, Patient being monitored and Timeout performed Patient Re-evaluated:Patient Re-evaluated prior to inductionOxygen Delivery Method: Circle system utilized Preoxygenation: Pre-oxygenation with 100% oxygen Intubation Type: IV induction Ventilation: Mask ventilation without difficulty Laryngoscope Size: Mac and 3 Grade View: Grade I Tube type: Oral Tube size: 7.5 mm Number of attempts: 1 Airway Equipment and Method: Stylet and LTA kit utilized Placement Confirmation: ETT inserted through vocal cords under direct vision,  positive ETCO2 and breath sounds checked- equal and bilateral Secured at: 22 cm Tube secured with: Tape Dental Injury: Teeth and Oropharynx as per pre-operative assessment

## 2012-04-19 NOTE — ED Notes (Signed)
Paged ortho for splint at this time

## 2012-04-19 NOTE — Plan of Care (Signed)
Problem: Phase I Progression Outcomes Goal: Pre op-initial discharge plan identified Outcome: Completed/Met Date Met:  04/19/12 Will probably require SNF for rehab d/t medical history along with new hip repair and hand fracture.  Lives with husband but he states will not be able to provide necessary care at discharge.

## 2012-04-19 NOTE — Progress Notes (Signed)
Orthopedic Tech Progress Note Patient Details:  Mallory Perez April 20, 1931 [id number] Ulna gutter splint applied to Right hand/wrist. Due to arthritis in patient's hand and fingers splint had to be slightly modified. 4th and 5th digits not bent as hard or as far as normal ulna gutter. Stiffness in patient's fingers would not allow and I didn't want to force it. Break in 5th metacarpal covered well. Patient tolerated application well.  Ortho Devices Type of Ortho Device: Ulna gutter splint Ortho Device/Splint Location: Right Ortho Device/Splint Interventions: Application   Asia R Thompson 04/19/2012, 1:41 PM

## 2012-04-19 NOTE — ED Notes (Signed)
Patient transported to X-ray 

## 2012-04-19 NOTE — Progress Notes (Signed)
Patient admitted to 5N06 from ED via stretcher.  Husband, son and daughter at bedside.  Resident alert and oriented X4.  Lives at home with husband.  Reports fall occurred yesterday am while standing at table eating breakfast.  After unrelieved pain at home, presented to ED with xrays revealing right femoral neck fracture and right 5th metacarpal fracture.  2 skin tears noted to right hand and 1 tear to rt 3rd finger.  Allevyn foam dressing applied to hand after cleansed with NS and tegaderm to finger.  [employer] notified to come place splint to rt hand/wrist.  Dr. Rito Ehrlich notified of patient's arrival, awaiting medical clearance for surgery.  Pt is NPO.  Pt and family oriented to room/unit/procedures.  Reviewed plan of care.  Pt and family verbalizes understanding.

## 2012-04-19 NOTE — Op Note (Signed)
Dictation Number:  [id number]

## 2012-04-19 NOTE — ED Provider Notes (Addendum)
History     CSN: [id number]  Arrival date & time 04/19/12  0917   First MD Initiated Contact with Patient 04/19/12 [phone]      Chief Complaint  Patient presents with  . Fall  . right leg pain   . Hypertension    (Consider location/radiation/quality/duration/timing/severity/associated sxs/prior treatment) Patient is a 79 y.o. female presenting with fall and hypertension. The history is provided by the patient.  Fall Pertinent negatives include no fever, no abdominal pain and no headaches.  Hypertension Pertinent negatives include no chest pain, no abdominal pain, no headaches and no shortness of breath.  pt with hx parkinsons, hx falls, presents after fall at home yesterday morning in kitchen. Had walker w her, stumbled, fell. No loc. Did hit head. No headache or nv. C/o contusion/abrasion to right hand, and right hip pain. Constant, dull, moderate. Worse w movement/palpation. No numbness/weakness. No neck or back pain.     Past Medical History  Diagnosis Date  . Depression     s/p ECT therapy  . Hypertension   . Myocardial infarction 1987  . Parkinson's disease   . Fall at home     Past Surgical History  Procedure Date  . Back surgery 2003 & 01/1989  . Abdominal hysterectomy 1995  . Bladder surgery 09/1988  . Electroconvulsive therapy     Family History  Problem Relation Age of Onset  . Angina Mother     and MI  . Alcohol abuse Other     brain tumor  . Diabetes Other     History  Substance Use Topics  . Smoking status: Never Smoker   . Smokeless tobacco: Never Used  . Alcohol Use: No    OB History    Grav Para Term Preterm Abortions TAB SAB Ect Mult Living                  Review of Systems  Constitutional: Negative for fever and chills.  HENT: Negative for neck pain.   Eyes: Negative for visual disturbance.  Respiratory: Negative for shortness of breath.   Cardiovascular: Negative for chest pain.  Gastrointestinal: Negative for abdominal pain.   Genitourinary: Negative for flank pain.  Musculoskeletal: Negative for back pain.  Skin: Negative for rash.  Neurological: Negative for headaches.  Hematological: Does not bruise/bleed easily.  Psychiatric/Behavioral: Negative for confusion.    Allergies  Review of patient's allergies indicates no known allergies.  Home Medications   Current Outpatient Rx  Name  Route  Sig  Dispense  Refill  . AMANTADINE HCL 100 MG PO TABS      Take one tablet (100 mg) po twice a day.   60 tablet   4   . ATENOLOL 50 MG PO TABS      TAKE 1/2 TABLET BY MOUTH TWICE A DAY   60 tablet   6   . [facility] MELATONIN 3 MG PO TABS      TAKE 1 TABLET 2 HOURS BEFORE BEDTIME.   30 tablet   3   . OMEGA-3 FATTY ACIDS 1000 MG PO CAPS   Oral   Take 1 g by mouth daily.           Marland Kitchen HYDROCHLOROTHIAZIDE 12.5 MG PO CAPS   Oral   Take 1 capsule (12.5 mg total) by mouth daily.   30 capsule   1     BP 150/85  Pulse 78  Temp 98.7 F (37.1 C) (Oral)  Resp 22  SpO2 94%  Physical Exam  Nursing note and vitals reviewed. Constitutional: She is oriented to person, place, and time. She appears well-developed and well-nourished. No distress.  HENT:  Mouth/Throat: Oropharynx is clear and moist.       Contusion right scalp  Eyes: Conjunctivae normal are normal. Pupils are equal, round, and reactive to light. No scleral icterus.  Neck: Normal range of motion. Neck supple. No tracheal deviation present.  Cardiovascular: Normal rate, regular rhythm, normal heart sounds and intact distal pulses.   Pulmonary/Chest: Effort normal and breath sounds normal. No respiratory distress. She exhibits no tenderness.  Abdominal: Soft. Normal appearance and bowel sounds are normal. She exhibits no distension. There is no tenderness.  Musculoskeletal: She exhibits no edema.       Bruising/tenderness right hand, abrasion/skin tear. Pain/tenderness right hip. Distal pulses palp. No other focal bony tenderness no bil ext exam.  CTLS spine, non tender, aligned, no step off.   Neurological: She is alert and oriented to person, place, and time.       Motor intact bil. Resting tremor.   Skin: Skin is warm and dry. No rash noted.  Psychiatric: She has a normal mood and affect.    ED Course  Procedures (including critical care time)  Results for orders placed during the hospital encounter of 04/19/12  CBC      Component Value Range   WBC 10.3  4.0 - 10.5 K/uL   RBC 5.43 (*) 3.87 - 5.11 MIL/uL   Hemoglobin 16.3 (*) 12.0 - 15.0 g/dL   HCT 96.0 (*) 45.4 - 09.8 %   MCV 89.9  78.0 - 100.0 fL   MCH 30.0  26.0 - 34.0 pg   MCHC 33.4  30.0 - 36.0 g/dL   RDW 11.9  14.7 - 82.9 %   Platelets 154  150 - 400 K/uL  COMPREHENSIVE METABOLIC PANEL      Component Value Range   Sodium 140  135 - 145 mEq/L   Potassium 3.8  3.5 - 5.1 mEq/L   Chloride 104  96 - 112 mEq/L   CO2 26  19 - 32 mEq/L   Glucose, Bld 119 (*) 70 - 99 mg/dL   BUN 19  6 - 23 mg/dL   Creatinine, Ser 5.62  0.50 - 1.10 mg/dL   Calcium 9.1  8.4 - 13.0 mg/dL   Total Protein 6.4  6.0 - 8.3 g/dL   Albumin 3.4 (*) 3.5 - 5.2 g/dL   AST 15  0 - 37 U/L   ALT 19  0 - 35 U/L   Alkaline Phosphatase 100  39 - 117 U/L   Total Bilirubin 1.1  0.3 - 1.2 mg/dL   GFR calc non Af Amer 81 (*) >90 mL/min   GFR calc Af Amer >90  >90 mL/min  PROTIME-INR      Component Value Range   Prothrombin Time 13.4  11.6 - 15.2 seconds   INR 1.03  0.00 - 1.49   Dg Chest 1 View  04/19/2012  *RADIOLOGY REPORT*  Clinical Data: Preoperative evaluation, hip fracture  CHEST - 1 VIEW  Comparison: None.  Findings: Atherosclerotic and tortuous thoracic aorta.  The cardiac size is within normal limits.  Prominence of the right paratracheal stripe likely reflects underlying vascular structures.  There is mild central vascular congestion without overt edema.  No pneumothorax, pleural effusion or focal airspace consolidation. The bones diffusely osteopenic.  Mild dextroconvex scoliosis of the  thoracic spine.  Incompletely imaged posterior lumbar fixation hardware.  IMPRESSION:  1.  No acute cardiopulmonary disease. 2.  Aortic atherosclerosis. 3.  Mild pulmonary vascular congestion without edema.   Original Report Authenticated By: Malachy Moan, M.D.    Dg Hip Complete Right  04/19/2012  *RADIOLOGY REPORT*  Clinical Data: Post fall, now with right hip pain  RIGHT HIP - COMPLETE 2+ VIEW  Comparison: None.  Findings:  There is a minimally displaced fracture of the right femoral neck with foreshortening and angulation.  No definite pelvic fracture. Limited visualization of the contralateral left hip is normal. Post surgical change of the lower lumbar spine, incompletely evaluated.  Multiple phleboliths overlie the lower pelvis.  No radiopaque foreign body.  IMPRESSION: Minimally displaced fracture of the right femoral neck with foreshortening and angulation.   Original Report Authenticated By: Tacey Ruiz, MD    Dg Hand Complete Right  04/19/2012  *RADIOLOGY REPORT*  Clinical Data: Post fall, now with hand pain, especially the middle finger  RIGHT HAND - COMPLETE 3+ VIEW  Comparison: None.  Findings:  Diffuse osteopenia.  There is mild windswept deformity of all the MCP joints with varus angulation.  There is a minimally displaced fracture of the distal metaphysis of the fifth metacarpal with angulation.  Evaluation of the adjacent fifth MCP joint is degraded secondary to obliquity.  No definite fracture of the middle finger though evaluation degraded secondary to obliquity.  Mild diffuse soft tissue swelling especially about the ulnar aspect of the hand.  No radiopaque foreign body.  IMPRESSION: 1.  Minimally displaced fracture of the distal metaphysis of the fifth metacarpal.  Evaluation of the adjacent MCP joint is degraded secondary to obliquity.  2. No definite fracture of the middle digit though note, evaluation degraded secondary to obliquity.  Further evaluation with dedicated radiographs of  this digit may be performed as clinically indicated.   Original Report Authenticated By: Tacey Ruiz, MD        MDM  Xrays, labs.  Reviewed nursing notes and prior charts for additional history.     Date: 04/19/2012  Rate: 76  Rhythm: normal sinus rhythm  QRS Axis: normal  Intervals: normal  ST/T Wave abnormalities: normal  Conduction Disutrbances:none  Narrative Interpretation:   Old EKG Reviewed: unchanged Fair amount of artifact, pt shaking/tremulous  Ulnar gutter splint to right hand/wrist.   Pt states has no local orthopedist. Ortho on call paged for consult, med service to admit.  Morphine for pain.  Recheck pt comfortable.   Have discussed w [facility], states admit, med surg, team 11.  Also discussed hip and hand fx with ortho on call, Dr Sherlean Foot.   Tet unknown. Tet im.      Suzi Roots, MD 04/19/12 1056  Suzi Roots, MD 04/19/12 [phone]

## 2012-04-19 NOTE — Transfer of Care (Signed)
Immediate Anesthesia Transfer of Care Note  Patient: Mallory Perez  Procedure(s) Performed: Procedure(s) (LRB) with comments: ARTHROPLASTY BIPOLAR HIP (Right)  Patient Location: PACU  Anesthesia Type:General  Level of Consciousness: sedated  Airway & Oxygen Therapy: Patient Spontanous Breathing and Patient connected to face mask oxygen  Post-op Assessment: Report given to PACU RN and Post -op Vital signs reviewed and stable  Post vital signs: Reviewed and stable  Complications: No apparent anesthesia complications

## 2012-04-19 NOTE — Anesthesia Preprocedure Evaluation (Addendum)
Anesthesia Evaluation  Patient identified by MRN, date of birth, ID band Patient awake    Reviewed: Allergy & Precautions, H&P , NPO status , Patient's Chart, lab work & pertinent test results, reviewed documented beta blocker date and time   Airway Mallampati: I TM Distance: >3 FB Neck ROM: Limited    Dental  (+) Edentulous Upper, Edentulous Lower and Dental Advisory Given   Pulmonary  breath sounds clear to auscultation        Cardiovascular hypertension, Pt. on medications and Pt. on home beta blockers + CAD, + Past MI and + Peripheral Vascular Disease Rhythm:regular Rate:Normal     Neuro/Psych Anxiety Depression    GI/Hepatic   Endo/Other    Renal/GU      Musculoskeletal   Abdominal (+)  Abdomen: soft. Bowel sounds: normal.  Peds  Hematology   Anesthesia Other Findings Parkinson's  Reproductive/Obstetrics                        Anesthesia Physical Anesthesia Plan  ASA: III  Anesthesia Plan: General   Post-op Pain Management:    Induction: Intravenous  Airway Management Planned: Oral ETT  Additional Equipment:   Intra-op Plan:   Post-operative Plan: Extubation in OR  Informed Consent: I have reviewed the patients History and Physical, chart, labs and discussed the procedure including the risks, benefits and alternatives for the proposed anesthesia with the patient or authorized representative who has indicated his/her understanding and acceptance.     Plan Discussed with: CRNA, Anesthesiologist and Surgeon  Anesthesia Plan Comments:         Anesthesia Quick Evaluation

## 2012-04-19 NOTE — H&P (Signed)
[facility] History and Physical  MOSSIE GILDER NFA:[id number] DOB: Dec 03, 1931 DOA: 04/19/2012  Referring physician: Cathren Laine, ER physician PCP: [name], MD  Specialists: Dannielle Huh, orthopedic surgery  Chief Complaint: Fall  HPI: Mallory Perez is a 79 y.o. female  With past history of CAD, Parkinson's disease and hypertension who has very limited mobility and ambulates minimally using a walker. Today she sustained a mechanical fall which he felt weak while prolonged standing and fell. She restart trying to get all of her walker slipped an injury to her right hand/fifth finger. She fell landing on her right side and was unable to stand. She did not lose consciousness. She brought into the emergency room and evaluated and found to have a right femoral neck fracture as well as a right fifth metacarpal fracture. With the surgery evaluated the patient placed her hand and a right ulnar splint. Hospitals were called for evaluation and admission.  Review of Systems: Patient seen at transfer to the floor. She complains of pain most notably in the right side of her hip as well as in her right hand she denies any headaches, vision changes, dysphasia, chest pain, palpitations or shortness of breath, wheeze, cough, abdominal pain, hematuria, dysuria, constipation or diarrhea or focal steady numbness or weakness pain other than described above. Review systems otherwise negative.  Past Medical History  Diagnosis Date  . Depression     s/p ECT therapy  . Hypertension   . Myocardial infarction 1987  . Parkinson's disease   . Fall at home    Past Surgical History  Procedure Date  . Back surgery 2003 & 01/1989  . Abdominal hysterectomy 1995  . Bladder surgery 09/1988  . Electroconvulsive therapy    Social History:  reports that she has never smoked. She has never used smokeless tobacco. She reports that she does not drink alcohol or use illicit drugs. Patient lives at home with her husband.  Her mobility is extremely limited. She's most part sedentary and occasionally when she does get around its very limited using a walker, most of the bathroom  No Known Allergies  Family History  Problem Relation Age of Onset  . Angina Mother     and MI  . Alcohol abuse Other     brain tumor  . Diabetes Other     Prior to Admission medications   Medication Sig Start Date End Date Taking? Authorizing Provider  amantadine (SYMMETREL) 100 MG capsule Take 50-100 mg by mouth 2 (two) times daily. 100 mg every morning and 50 mg every evening.   Yes Historical Provider, MD  atenolol (TENORMIN) 50 MG tablet Take 25 mg by mouth every evening.   Yes Historical Provider, MD  MELATONIN PO Take 1 tablet by mouth at bedtime.   Yes Historical Provider, MD  OXYCODONE-ACETAMINOPHEN PO Take 0.25 tablets by mouth once.   Yes Historical Provider, MD   Physical Exam: Filed Vitals:   04/19/12 4696 04/19/12 0932 04/19/12 1124 04/19/12 1244  BP:  150/85 153/74 173/86  Pulse:  78 79 77  Temp:  98.7 F (37.1 C)  97.7 F (36.5 C)  TempSrc:  Oral  Oral  Resp:  22 18 18   SpO2: 96% 94% 95% 93%     General:  Alert and oriented x3, mild distress secondary to pain, fatigued, looks older than stated age  Eyes: Sclera nonicteric, extraocular movements are intact  ENT: Normocephalic and atraumatic point extremities are slightly dry  Neck: Neck is supple no  JVD  Cardiovascular: Regular rate and rhythm, S1-S2, soft 2/6 systolic ejection murmur  Respiratory: Clear to auscultation bilaterally  Abdomen: Soft, nontender, nondistended, positive bowel sounds  Skin: Dry skin with skin tags. Evidence of old bruising  Musculoskeletal: No clubbing or cyanosis or edema, right hand in splint  Psychiatric: Patient is appropriate, no evidence of psychosis  Neurologic: No focal deficits  Labs on Admission:  Basic Metabolic Panel:  Lab 04/19/12 1914  NA 140  K 3.8  CL 104  CO2 26  GLUCOSE 119*  BUN 19   CREATININE 0.69  CALCIUM 9.1  MG --  PHOS --   Liver Function Tests:  Lab 04/19/12 0934  AST 15  ALT 19  ALKPHOS 100  BILITOT 1.1  PROT 6.4  ALBUMIN 3.4*   CBC:  Lab 04/19/12 0934  WBC 10.3  NEUTROABS --  HGB 16.3*  HCT 48.8*  MCV 89.9  PLT 154    Radiological Exams on Admission: Dg Chest 1 View  04/19/2012    IMPRESSION:  1.  No acute cardiopulmonary disease. 2.  Aortic atherosclerosis. 3.  Mild pulmonary vascular congestion without edema.   Original Report Authenticated By: Malachy Moan, M.D.    Dg Hip Complete Right  04/19/2012   IMPRESSION: Minimally displaced fracture of the right femoral neck with foreshortening and angulation.   Original Report Authenticated By: Tacey Ruiz, MD    Dg Hand Complete Right  04/19/2012   IMPRESSION: 1.  Minimally displaced fracture of the distal metaphysis of the fifth metacarpal.  Evaluation of the adjacent MCP joint is degraded secondary to obliquity.  2. No definite fracture of the middle digit though note, evaluation degraded secondary to obliquity.  Further evaluation with dedicated radiographs of this digit may be performed as clinically indicated.   Original Report Authenticated By: Tacey Ruiz, MD     EKG: Independently reviewed. PVCs, left ventricular hypertrophy, sinus tachycardia  Assessment/Plan Principal Problem:  *Fracture of femoral neck, right: Okay for operating room. Patient is at mild risk for events given limited mobility and history of MI, but benefits certainly outweigh risk.  Active Problems:  UNSPECIFIED ANEMIA: Monitor for acute blood loss anemia after surgery  DEPRESSION  PARKINSON'S DISEASE: Home medications after surgery   HYPERTENSION: We'll restart home medications after surgery   MYOCARDIAL INFARCTION, HX OF: Stable.   Abnormality of gait: Patient has limited mobility as is. Hopefully after going to short-term skilled nursing, she actually will be improved as opposed to prior to accident    Fracture of fifth metacarpal bone of right hand: Ulnar gutter splint placed     Code Status: DO NOT RESUSCITATE  Family Communication: Discussed plan with patient and family at the bedside  Disposition Plan: 3-4 days and from there, short-term skilled nursing  Time spent: 25 minutes  Hollice Espy [facility] Pager [phone]  If 7PM-7AM, please contact night-coverage [email or web address] Password [facility] 04/19/2012, 2:27 PM

## 2012-04-19 NOTE — ED Notes (Signed)
TO ED via [facility] form home with c/o fell yesterday at 9am, was assisted up by husband and caregiver. Was able to ambulate to bathroom with help yesterday. Last evening, husband tried to assist to bathroom and was unable to- pt c/o more right thigh pain, unable to ambulate since. Has bruise on right hand, right side of head. Alert/oriented x3

## 2012-04-19 NOTE — ED Notes (Signed)
Pt undressed, in gown, on monitor, continuous pulse oximetry and blood pressure cuff; vitals and EKG being performed; family at bedside

## 2012-04-19 NOTE — Progress Notes (Signed)
INITIAL NUTRITION ASSESSMENT  DOCUMENTATION CODES Per approved criteria  -Not Applicable   INTERVENTION: Offer ensure complete once diet advanced.   NUTRITION DIAGNOSIS: Inadequate oral intake related to inability to eat as evidenced by NPO status.  Goal: Pt to meet >/= 90% of their estimated nutrition needs.   Monitor:  Diet advancement, PO intake  Reason for Assessment: MD Consult  79 y.o. female  Admitting Dx: Fracture of femoral neck, right  ASSESSMENT: Unable to assess pt at this time. Per chart pt admitted after fall. To have surgery for hip fracture. Plan for SNF at d/c.   Height: Ht Readings from Last 1 Encounters:  07/03/11 5\' 4"  (1.626 m)    Weight: Wt Readings from Last 1 Encounters:  12/26/11 126 lb (57.153 kg)    Ideal Body Weight: 54.5 kg  % Ideal Body Weight: 105%  Wt Readings from Last 10 Encounters:  12/26/11 126 lb (57.153 kg)  08/01/11 125 lb (56.7 kg)  07/03/11 126 lb (57.153 kg)  05/14/11 126 lb (57.153 kg)  04/11/10 131 lb 8 oz (59.648 kg)  10/06/08 132 lb 4 oz (59.988 kg)  08/05/07 130 lb (58.968 kg)  04/03/07 135 lb (61.236 kg)    Usual Body Weight: 126 lb  % Usual Body Weight: 100%  BMI:  21.6  Estimated Nutritional Needs: Kcal: 1400-1600 Protein: 65-75 grams Fluid: > 1.5 L/day  Skin: skin tear  Diet Order: NPO  EDUCATION NEEDS: -No education needs identified at this time  No intake or output data in the 24 hours ending 04/19/12 1737  Last BM: PTA   Labs:   Lab 04/19/12 0934  NA 140  K 3.8  CL 104  CO2 26  BUN 19  CREATININE 0.69  CALCIUM 9.1  MG --  PHOS --  GLUCOSE 119*    CBG (last 3)  No results found for this basename: GLUCAP:3 in the last 72 hours  Scheduled Meds:   . acetaminophen  1,000 mg Intravenous Q6H  .  ceFAZolin (ANCEF) IV  2 g Intravenous On Call to OR  .  morphine injection  2 mg Intravenous Once  . ondansetron (ZOFRAN) IV  4 mg Intravenous Once    Continuous Infusions:   .  sodium chloride 10 mL/hr (04/19/12 1101)    Past Medical History  Diagnosis Date  . Depression     s/p ECT therapy  . Hypertension   . Myocardial infarction 1987  . Parkinson's disease   . Fall at home     Past Surgical History  Procedure Date  . Back surgery 2003 & 01/1989  . Abdominal hysterectomy 1995  . Bladder surgery 09/1988  . Electroconvulsive therapy     Kendell Bane RD, LDN, CNSC [phone] Weekend/After Hours Pager

## 2012-04-19 NOTE — Anesthesia Postprocedure Evaluation (Signed)
  Anesthesia Post-op Note  Patient: Mallory Perez  Procedure(s) Performed: Procedure(s) (LRB) with comments: ARTHROPLASTY BIPOLAR HIP (Right)  Patient Location: PACU  Anesthesia Type:General  Level of Consciousness: awake and sedated  Airway and Oxygen Therapy: Patient Spontanous Breathing and Patient connected to face mask oxygen  Post-op Pain: mild  Post-op Assessment: Post-op Vital signs reviewed, Patient's Cardiovascular Status Stable, Respiratory Function Stable, Patent Airway, No signs of Nausea or vomiting and Pain level controlled  Post-op Vital Signs: stable  Complications: No apparent anesthesia complications

## 2012-04-19 NOTE — Progress Notes (Signed)
Orthopedic Tech Progress Note Patient Details:  Mallory Perez 1931/12/23 [id number]  Patient ID: Mallory Perez, female   DOB: 1931-04-11, 79 y.o.   MRN: [id number] Trapeze bar patient helper  Nikki Dom 04/19/2012, 7:36 PM

## 2012-04-19 NOTE — ED Notes (Signed)
Requested floor rn call ortho after mepiplex applied to skin tear

## 2012-04-19 NOTE — Progress Notes (Signed)
Orthopedic Tech Progress Note Patient Details:  Mallory Perez 1932-02-10 [id number] Ortho tech called to apply splint to Right hand/wrist. After removing bandages Ortho tech noticed bad skin tears and notified ER nurse. Nurse reviewed skin tears and determined best way to protect patient from infection in splint was to apply special dressing. Dressing unavailable in ER at that time. Nurse called to floor where patient was to be admitted and notified them that they are to change and apply new special dressing then call Ortho Tech back to apply splint to Right hand.  Patient ID: Mallory Perez, female   DOB: [birth year], 79 y.o.   MRN: [id number]   [name] 04/19/2012, 12:30 PM

## 2012-04-19 NOTE — ED Notes (Signed)
Orthopedic tech is at bedside

## 2012-04-20 DIAGNOSIS — S72009A Fracture of unspecified part of neck of unspecified femur, initial encounter for closed fracture: Secondary | ICD-10-CM | POA: Diagnosis not present

## 2012-04-20 DIAGNOSIS — W19XXXA Unspecified fall, initial encounter: Secondary | ICD-10-CM | POA: Diagnosis not present

## 2012-04-20 DIAGNOSIS — G20 Parkinson's disease: Secondary | ICD-10-CM

## 2012-04-20 DIAGNOSIS — S62309A Unspecified fracture of unspecified metacarpal bone, initial encounter for closed fracture: Secondary | ICD-10-CM | POA: Diagnosis not present

## 2012-04-20 LAB — BASIC METABOLIC PANEL
BUN: 22 mg/dL (ref 6–23)
Chloride: 104 mEq/L (ref 96–112)
GFR calc Af Amer: 57 mL/min — ABNORMAL LOW (ref >90)
GFR calc non Af Amer: 50 mL/min — ABNORMAL LOW (ref >90)
Potassium: 3.9 mEq/L (ref 3.5–5.1)
Sodium: 137 mEq/L (ref 135–145)

## 2012-04-20 LAB — CBC
HCT: 40.1 % (ref 36.0–46.0)
MCHC: 33.2 g/dL (ref 30.0–36.0)
Platelets: 123 10*3/uL — ABNORMAL LOW (ref 150–400)
RDW: 13.8 % (ref 11.5–15.5)
WBC: 8.6 10*3/uL (ref 4.0–10.5)

## 2012-04-20 NOTE — Clinical Social Work Psychosocial (Signed)
Clinical Social Work Department BRIEF PSYCHOSOCIAL ASSESSMENT 04/20/2012  Patient:  Mallory Perez, Mallory Perez     Account Number:  [id number]     Admit date:  04/19/2012  Clinical Social Worker:  Skip Mayer  Date/Time:  04/20/2012 05:00 PM  Referred by:  Physician  Date Referred:  04/20/2012 Referred for  SNF Placement   Other Referral:   Interview type:  Family Other interview type:    PSYCHOSOCIAL DATA Living Status:  FAMILY Admitted from facility:   Level of care:   Primary support name:  Mallory Maduro Primary support relationship to patient:  SPOUSE Degree of support available:   Adequate    CURRENT CONCERNS Current Concerns  Post-Acute Placement   Other Concerns:    SOCIAL WORK ASSESSMENT / PLAN CSW spoke via phone with pt's husband re: PT recommendation for SNF.  CSW explained placement process and answered questions.  Pt's husband reports preference is [facility].  CSW completed FL2 and initiated SNF search.  Weekday CSW to f/u with offers.   Assessment/plan status:  Information/Referral to [facility] Other assessment/ plan:   Information/referral to community resources:   SNF  PTAR    PATIENT'S/FAMILY'S RESPONSE TO PLAN OF CARE: Pt's husband reports agreeable to SNF search and notes [facility] as preference.  Pt's husband verbalized understanding of placement process and appreciation for CSW assist.        Dellie Burns, MSW, LCSWA [phone] (Weekends 8:00am-4:30pm)

## 2012-04-20 NOTE — Evaluation (Signed)
Physical Therapy Evaluation Patient Details Name: Mallory Perez MRN: [id number] DOB: April 25, 1931 Today's Date: 04/20/2012 Time: 4098-1191 PT Time Calculation (min): 25 min  PT Assessment / Plan / Recommendation Clinical Impression  pt presents with fall resulting in R hip fx s/p hemi hip and R 5th Metacarpal fx in a splint.  pt very limited mobility and question how much she was really doing at home.  pt seems very rigid in all extremities and per pt and son seems to having freezing episodes often.  Per son pt was never formally diagnosed with Parkinson's Disease, however they were told she had some of the signs.  May need MD to f/u with family and pt about Parkinson's symptoms.  pt at this time will need SNF at D/C.      PT Assessment  Patient needs continued PT services    Follow Up Recommendations  SNF    Does the patient have the potential to tolerate intense rehabilitation      Barriers to Discharge None      Equipment Recommendations  None recommended by PT    Recommendations for Other Services OT consult   Frequency Min 3X/week    Precautions / Restrictions Precautions Precautions: Fall;Posterior Hip Precaution Booklet Issued: No Restrictions Weight Bearing Restrictions: Yes RLE Weight Bearing: Weight bearing as tolerated Other Position/Activity Restrictions: No order written, but R Hand/Wrist maintained in [facility].     Pertinent Vitals/Pain Pt indicates pain in R LE during mobility, but indicates much improved once settled in chair.        Mobility  Bed Mobility Bed Mobility: Supine to Sit;Sitting - Scoot to Edge of Bed Supine to Sit: 1: +2 Total assist Supine to Sit: Patient Percentage: 20% Sitting - Scoot to Edge of Bed: 1: +2 Total assist Sitting - Scoot to Edge of Bed: Patient Percentage: 10% Details for Bed Mobility Assistance: cues for step-by-step through bed mobility.   Transfers Transfers: Sit to Stand;Stand to Sit;Stand Pivot Transfers Sit to Stand: 1:  +2 Total assist;From bed Sit to Stand: Patient Percentage: 20% Stand to Sit: 1: +2 Total assist;To chair/3-in-1 Stand to Sit: Patient Percentage: 20% Stand Pivot Transfers: 1: +2 Total assist Stand Pivot Transfers: Patient Percentage: 20% Details for Transfer Assistance: cues for use of UEs, positioning of LEs, step-by-step through transfer.  pt required PT's foot inbetween her feet to increase BOS.   Ambulation/Gait Ambulation/Gait Assistance: Not tested (comment) Stairs: No Wheelchair Mobility Wheelchair Mobility: No    Shoulder Instructions     Exercises     PT Diagnosis: Difficulty walking;Generalized weakness  PT Problem List: Decreased strength;Decreased range of motion;Decreased activity tolerance;Decreased balance;Decreased mobility;Decreased coordination;Decreased cognition;Decreased knowledge of use of DME;Decreased knowledge of precautions;Impaired tone;Pain PT Treatment Interventions: DME instruction;Gait training;Functional mobility training;Therapeutic activities;Therapeutic exercise;Balance training;Cognitive remediation;Neuromuscular re-education;Patient/family education   PT Goals Acute Rehab PT Goals PT Goal Formulation: With patient Time For Goal Achievement: 05/04/12 Potential to Achieve Goals: Fair Pt will go Supine/Side to Sit: with min assist PT Goal: Supine/Side to Sit - Progress: Goal set today Pt will go Sit to Supine/Side: with min assist PT Goal: Sit to Supine/Side - Progress: Goal set today Pt will go Sit to Stand: with min assist PT Goal: Sit to Stand - Progress: Goal set today Pt will go Stand to Sit: with min assist PT Goal: Stand to Sit - Progress: Goal set today Pt will Transfer Bed to Chair/Chair to Bed: with min assist PT Transfer Goal: Bed to Chair/Chair to Bed - Progress: Goal  set today Pt will Ambulate: 16 - 50 feet;with min assist;with rolling walker PT Goal: Ambulate - Progress: Goal set today Pt will Perform Home Exercise Program: with  min assist PT Goal: Perform Home Exercise Program - Progress: Goal set today  Visit Information  Last PT Received On: 04/20/12 Assistance Needed: +2    Subjective Data  Subjective: My brain tries to tell my legs to move, but they don't listen.   Patient Stated Goal: Walk   Prior Functioning  Home Living Lives With: Spouse Available Help at Discharge: Family;Available 24 hours/day Type of Home: House Home Adaptive Equipment: Walker - rolling Prior Function Level of Independence: Needs assistance Needs Assistance: Bathing;Dressing;Meal Prep;Light Housekeeping;Gait;Transfers Bath: Moderate Dressing: Moderate Meal Prep: Total Light Housekeeping: Total Gait Assistance: Per son pt needed occasional A from pt's husband to initiate gait and A during freezing episodes.   Transfer Assistance: A from standard furniture, ModI from her lift recliner.   Able to Take Stairs?: Yes (With A only.  ) Driving: No Vocation: Retired Musician: No difficulties    Cognition  Overall Cognitive Status: History of cognitive impairments - at baseline Arousal/Alertness: Awake/alert Orientation Level: Disoriented to;Time Behavior During Session: Flat affect Cognition - Other Comments: pt with STM deficits and some difficulty with orientation.  Per son pt is forgetful at home.      Extremity/Trunk Assessment Right Lower Extremity Assessment RLE ROM/Strength/Tone: Deficits RLE ROM/Strength/Tone Deficits: Post-op pain limits formal testing, however pt very rigid and ? if this is baseline from Parkinson's RLE Sensation: WFL - Light Touch Left Lower Extremity Assessment LLE ROM/Strength/Tone: Deficits LLE ROM/Strength/Tone Deficits: Very rigid tone limiting ROM.  Inconsistencies from pt as to how long she has been this rigid.   LLE Sensation: WFL - Light Touch Trunk Assessment Trunk Assessment: Kyphotic   Balance Balance Balance Assessed: No  End of Session PT - End of  Session Equipment Utilized During Treatment: Gait belt;Oxygen Activity Tolerance: Patient limited by fatigue Patient left: in chair;with call bell/phone within reach;with family/visitor present Nurse Communication: Mobility status  GP     [name], [place] [phone] 04/20/2012, 9:33 AM

## 2012-04-20 NOTE — Op Note (Signed)
NAMETEREKA, THORLEY NO.:  [id number]  MEDICAL RECORD NO.:  [id number]  LOCATION:  [id number]                        FACILITY:  [facility]  PHYSICIAN:  Mila Homer. Sherlean Foot, M.D. DATE OF BIRTH:  20-Aug-1931  DATE OF PROCEDURE:  04/19/2012 DATE OF DISCHARGE:                              OPERATIVE REPORT   SURGEON:  Mila Homer. Sherlean Foot, M.D.  ASSISTANT:  Altamese Cabal, PA-C  ANESTHESIA:  General.  PREOPERATIVE DIAGNOSIS:  Right hip femoral neck fracture.  POSTOPERATIVE DIAGNOSIS:  Right hip femoral neck fracture.  PROCEDURE:  Right hip hemiarthroplasty.  INDICATION FOR PROCEDURE:  The patient is a 79 year old white female status post fall and has a right metacarpal fracture, which was treated nonoperatively and a right femoral neck fracture.  Informed consent was obtained.  DESCRIPTION OF PROCEDURE:  The patient was laid supine and administered general anesthesia.  Right hip was prepped and draped in usual sterile fashion with the patient in the left lateral decubitus position.  A Southern incision was made directly over the __________ with #10 blade. Caudal dissection was continued down to and through the fascia lata. Trial retractors were in place.  I then isolated and reflected the short external rotators off the piriformis fossa and tagged this with #2 Tevdek sutures.  I then __________ the capsule and tagged those 2 Tevdek sutures as well.  Then removed the fracture femoral neck and head.  The head measured at 48 mm.  A trial of 48 was stable.  I then used a cutting guide and made a femoral neck cut.  I then sequentially reamed first to 14 and then tried a 48 unipolar head afforded excellent stability.  I removed the trial components, copiously irrigated.  I then put down the ligament and had a hip fracture stem and then attached the 48-mm unipolar ball __________.  I then located the hip pretty good range of motion is very stable.  I then repaired the short  external rotators and piriformis fossa to drill holes in the femur.  Then, I did also repair the hip joint capsule with interrupted #2 Tevdek sutures. Closed the fascia lata with #1 running Vicryl irrigated again and closed the soft tissues with 0 Vicryl,  subcuticular with 2-0 Vicryl stitches, and skin with staples and placed a Mepilex dressing.  EBL:  300 mL.  COMPLICATION:  None.  DRAINS:  None.          ______________________________ Mila Homer. Sherlean Foot, M.D.     SDL/MEDQ  D:  04/19/2012  T:  04/20/2012  Job:  [id number]

## 2012-04-20 NOTE — Progress Notes (Signed)
SPORTS MEDICINE AND JOINT REPLACEMENT  Mallory Spurling, MD   Mallory Cabal, PA-C [address] [place], [place], [place]  [address]                             [phone]   PROGRESS NOTE  Subjective:  negative for Chest Pain  negative for Shortness of Breath  negative for Nausea/Vomiting   negative for Calf Pain  negative for Bowel Movement   Tolerating Diet: yes         Patient reports pain as 2 on 0-10 scale.    Objective: Vital signs in last 24 hours:   Patient Vitals for the past 24 hrs:  BP Temp Temp src Pulse Resp SpO2  04/20/12 0551 134/71 mmHg 99 F (37.2 C) Oral 85  18  97 %  04/20/12 0226 151/72 mmHg 97.5 F (36.4 C) - 62  18  100 %  04/20/12 0030 122/69 mmHg - - 68  18  98 %  04/19/12 2330 151/76 mmHg - - 80  16  97 %  04/19/12 2245 155/80 mmHg - - 70  16  100 %  04/19/12 2145 130/63 mmHg 97.5 F (36.4 C) Oral 72  18  100 %  04/19/12 2105 135/63 mmHg - - 65  - -  04/19/12 2100 152/79 mmHg - - 79  16  98 %  04/19/12 2054 157/74 mmHg 97.8 F (36.6 C) - 81  16  96 %  04/19/12 2045 164/80 mmHg 97.2 F (36.2 C) - 82  13  96 %  04/19/12 2030 164/89 mmHg - - 82  16  97 %  04/19/12 2015 166/81 mmHg - - 65  11  96 %  04/19/12 2000 159/89 mmHg - - 82  20  96 %  04/19/12 1945 - - - 80  17  95 %  04/19/12 1944 173/98 mmHg - - - - -  04/19/12 1934 163/93 mmHg - - - - -  04/19/12 1930 163/93 mmHg - - 79  16  100 %  04/19/12 1915 171/94 mmHg - - - 12  100 %  04/19/12 1900 114/62 mmHg 98.5 F (36.9 C) - 66  10  99 %  04/19/12 1600 176/85 mmHg 97.4 F (36.3 C) Oral 81  18  91 %  04/19/12 1244 173/86 mmHg 97.7 F (36.5 C) Oral 77  18  93 %  04/19/12 1124 153/74 mmHg - - 79  18  95 %    @flow {1959:LAST@   Intake/Output from previous day:   02/01 0701 - 02/02 0700 In: 2307.5 [P.O.:120; I.V.:2087.5] Out: 1225 [Urine:975]   Intake/Output this shift:       Intake/Output      02/01 0701 - 02/02 0700 02/02 0701 - 02/03 0700   P.O. 120    I.V. 2087.5    IV Piggyback  100    Total Intake 2307.5    Urine 975    Blood 250    Total Output 1225    Net +1082.5            LABORATORY DATA:  Basename 04/20/12 0640 04/19/12 0934  WBC 8.6 10.3  HGB 13.3 16.3*  HCT 40.1 48.8*  PLT 123* 154    Basename 04/20/12 0640 04/19/12 0934  NA 137 140  K 3.9 3.8  CL 104 104  CO2 23 26  BUN 22 19  CREATININE 1.05 0.69  GLUCOSE 113* 119*  CALCIUM 8.2*  9.1   Lab Results  Component Value Date   INR 1.03 04/19/2012    Examination:  Hip looks good with minimal swelling; NV intact.  Wound Exam: clean, dry, intact   Drainage:  None: wound tissue dry  Motor Exam: WNL  Sensory Exam: WNL normal   Assessment:    1 Day Post-Op  Procedure(s) (LRB): ARTHROPLASTY BIPOLAR HIP (Right)  ADDITIONAL DIAGNOSIS:  Principal Problem:  *Fracture of femoral neck, right Active Problems:  UNSPECIFIED ANEMIA  DEPRESSION  PARKINSON'S DISEASE  HYPERTENSION  MYOCARDIAL INFARCTION, HX OF  Abnormality of gait  Fracture of fifth metacarpal bone of right hand     Plan: Physical Therapy as ordered Weight Bearing as Tolerated (WBAT)  DVT Prophylaxis:  Lovenox  DISCHARGE PLAN: Skilled Nursing Facility/Rehab; family prefers [facility]  DISCHARGE NEEDS: Walker and 3-in-1 comode seat         Mallory Perez,STEPHEN D 04/20/2012, 10:01 AM

## 2012-04-20 NOTE — Clinical Social Work Placement (Addendum)
Clinical Social Work Department CLINICAL SOCIAL WORK PLACEMENT NOTE 04/20/2012  Patient:  Mallory Perez, Mallory Perez  Account Number:  [id number] Admit date:  04/19/2012  Clinical Social Worker:  Dellie Burns, Theresia Majors  Date/time:  04/20/2012 04:00 PM  Clinical Social Work is seeking post-discharge placement for this patient at the following level of care:   SKILLED NURSING   (*CSW will update this form in Epic as items are completed)   04/20/2012  Patient/family provided with [facility] Department of Clinical Social Work's list of facilities offering this level of care within the geographic area requested by the patient (or if unable, by the patient's family).  04/20/2012  Patient/family informed of their freedom to choose among providers that offer the needed level of care, that participate in Medicare, Medicaid or managed care program needed by the patient, have an available bed and are willing to accept the patient.  04/20/2012  Patient/family informed of [facility]' ownership interest in [facility], as well as of the fact that they are under no obligation to receive care at this facility.  PASARR submitted to EDS on 04/20/2012 PASARR number received from EDS on 04/20/2012  FL2 transmitted to all facilities in geographic area requested by pt/family on  04/20/2012 FL2 transmitted to all facilities within larger geographic area on N/A  Patient informed that his/her managed care company has contracts with or will negotiate with  certain facilities, including the following:     Patient/family informed of bed offers received:  04/21/2012 Patient chooses bed at [facility]  Patient to be transferred to  on  [facility] on 04/22/2012 Patient to be transferred to facility by [facility] by [facility]  The following physician request were entered in Epic:   Additional Comments:

## 2012-04-20 NOTE — Progress Notes (Signed)
[facility] PROGRESS NOTE  Mallory Perez ZOX:[id number] DOB: 1932/02/10 DOA: 04/19/2012 PCP: [name], MD  Assessment/Plan: Principal Problem:  *Fracture of femoral neck, right: Status post repair. Monitoring hemoglobin. Out of bed with physical therapy. For skilled nursing facility  Active Problems:  UNSPECIFIED ANEMIA: Minor acute blood loss anemia. Continue to follow. DEPRESSION  PARKINSON'S DISEASE: Home medications restarted  HYPERTENSION: Home meds restarted  MYOCARDIAL INFARCTION, HX OF: Stable.  Abnormality of gait: Patient has limited mobility as is. Hopefully after going to short-term skilled nursing, she actually will be improved as opposed to prior to accident  Fracture of fifth metacarpal bone of right hand: Ulnar gutter splint placed   Code Status: DO NOT RESUSCITATE  Family Communication: Plan discussed with husband and daughter at the bedside  Disposition Plan: Skilled nursing facility, likely Tuesday   Consultants:  Dannielle Huh, orthopedic surgery  Procedures:  Status post right hip hemiarthroplasty on 2/1  Antibiotics:  None  HPI/Subjective: Patient doing okay. Feels kind of tired. Pain is moderately well-controlled better she does not move  Objective: Filed Vitals:   04/20/12 0030 04/20/12 0226 04/20/12 0551 04/20/12 0800  BP: 122/69 151/72 134/71   Pulse: 68 62 85   Temp:  97.5 F (36.4 C) 99 F (37.2 C)   TempSrc:   Oral   Resp: 18 18 18 18   SpO2: 98% 100% 97% 96%    Intake/Output Summary (Last 24 hours) at 04/20/12 1122 Last data filed at 04/20/12 0600  Gross per 24 hour  Intake 2307.5 ml  Output   1225 ml  Net 1082.5 ml   There were no vitals filed for this visit.  Exam:  General: Alert and oriented x3, mild distress secondary to pain, fatigued, looks older than stated age  Cardiovascular: Regular rate and rhythm, S1-S2, soft 2/6 systolic ejection murmur Respiratory: Clear to auscultation bilaterally Abdomen: Soft,  nontender, nondistended, positive bowel sounds Musculoskeletal: No clubbing or cyanosis or edema, right hand in splint   Data Reviewed: Basic Metabolic Panel:  Lab 04/20/12 8119 04/19/12 0934  NA 137 140  K 3.9 3.8  CL 104 104  CO2 23 26  GLUCOSE 113* 119*  BUN 22 19  CREATININE 1.05 0.69  CALCIUM 8.2* 9.1  MG -- --  PHOS -- --   Liver Function Tests:  Lab 04/19/12 0934  AST 15  ALT 19  ALKPHOS 100  BILITOT 1.1  PROT 6.4  ALBUMIN 3.4*   CBC:  Lab 04/20/12 0640 04/19/12 0934  WBC 8.6 10.3  NEUTROABS -- --  HGB 13.3 16.3*  HCT 40.1 48.8*  MCV 90.5 89.9  PLT 123* 154     Recent Results (from the past 240 hour(s))  MRSA PCR SCREENING     Status: Normal   Collection Time   04/19/12  4:45 PM      Component Value Range Status Comment   MRSA by PCR NEGATIVE  NEGATIVE Final      Studies: Dg Chest 1 View  04/19/2012    IMPRESSION:  1.  No acute cardiopulmonary disease. 2.  Aortic atherosclerosis. 3.  Mild pulmonary vascular congestion without edema.   Original Report Authenticated By: Malachy Moan, M.D.    Dg Hip Complete Right  04/19/2012   IMPRESSION: Minimally displaced fracture of the right femoral neck with foreshortening and angulation.   Original Report Authenticated By: Tacey Ruiz, MD    Dg Pelvis Portable  04/19/2012   IMPRESSION: Expected postoperative appearance of right hip prosthesis.  No  evidence of fracture or dislocation.   Original Report Authenticated By: Myles Rosenthal, M.D.    Dg Hand Complete Right  04/19/2012   IMPRESSION: 1.  Minimally displaced fracture of the distal metaphysis of the fifth metacarpal.  Evaluation of the adjacent MCP joint is degraded secondary to obliquity.  2. No definite fracture of the middle digit though note, evaluation degraded secondary to obliquity.  Further evaluation with dedicated radiographs of this digit may be performed as clinically indicated.   Original Report Authenticated By: Tacey Ruiz, MD     Scheduled  Meds:   . acetaminophen  1,000 mg Intravenous Once  . amantadine  100 mg Oral BID  . atenolol  25 mg Oral QPM  . docusate sodium  100 mg Oral BID  . enoxaparin (LOVENOX) injection  40 mg Subcutaneous Q24H   Continuous Infusions:   . sodium chloride 75 mL/hr at 04/20/12 0458    Principal Problem:  *Fracture of femoral neck, right Active Problems:  UNSPECIFIED ANEMIA  DEPRESSION  PARKINSON'S DISEASE  HYPERTENSION  MYOCARDIAL INFARCTION, HX OF  Abnormality of gait  Fracture of fifth metacarpal bone of right hand    Time spent: 20 minutes    Hollice Espy  [facility] Pager [phone]. If 8PM-8AM, please contact night-coverage at [email or web address], password [facility] 04/20/2012, 11:22 AM  LOS: 1 day

## 2012-04-21 ENCOUNTER — Ambulatory Visit: Payer: Medicare Other | Admitting: Neurology

## 2012-04-21 DIAGNOSIS — W19XXXA Unspecified fall, initial encounter: Secondary | ICD-10-CM | POA: Diagnosis not present

## 2012-04-21 DIAGNOSIS — S72009A Fracture of unspecified part of neck of unspecified femur, initial encounter for closed fracture: Secondary | ICD-10-CM | POA: Diagnosis not present

## 2012-04-21 DIAGNOSIS — G20 Parkinson's disease: Secondary | ICD-10-CM | POA: Diagnosis not present

## 2012-04-21 DIAGNOSIS — S62309A Unspecified fracture of unspecified metacarpal bone, initial encounter for closed fracture: Secondary | ICD-10-CM | POA: Diagnosis not present

## 2012-04-21 LAB — BASIC METABOLIC PANEL
CO2: 25 mEq/L (ref 19–32)
Chloride: 106 mEq/L (ref 96–112)
GFR calc Af Amer: >90 mL/min (ref >90)
Potassium: 3.7 mEq/L (ref 3.5–5.1)

## 2012-04-21 LAB — CBC
MCV: 91.2 fL (ref 78.0–100.0)
Platelets: 119 10*3/uL — ABNORMAL LOW (ref 150–400)
RBC: 4.19 MIL/uL (ref 3.87–5.11)
RDW: 14 % (ref 11.5–15.5)
WBC: 9.9 10*3/uL (ref 4.0–10.5)

## 2012-04-21 MED ORDER — BISACODYL 5 MG PO TBEC: 5.0000 mg | DELAYED_RELEASE_TABLET | Freq: Every day | ORAL | Status: DC | PRN

## 2012-04-21 MED ORDER — METHOCARBAMOL 500 MG PO TABS: 500.0000 mg | ORAL_TABLET | Freq: Four times a day (QID) | ORAL | Status: DC | PRN

## 2012-04-21 MED ORDER — ZOLPIDEM TARTRATE 5 MG PO TABS: 5.0000 mg | ORAL_TABLET | Freq: Every evening | ORAL | Status: DC | PRN

## 2012-04-21 MED ORDER — HYDROCODONE-ACETAMINOPHEN 5-325 MG PO TABS: 1.0000 | ORAL_TABLET | Freq: Four times a day (QID) | ORAL | Status: DC | PRN

## 2012-04-21 NOTE — Progress Notes (Signed)
UR COMPLETED  

## 2012-04-21 NOTE — Discharge Summary (Signed)
Physician Discharge Summary  Mallory Perez ZOX:[id number] DOB: 1931-05-09 DOA: 04/19/2012  PCP: [name], MD  Admit date: 04/19/2012 Discharge date: 04/22/2012  Time spent: 25 minutes  Recommendations for Outpatient Follow-up:  1. Patient is being discharged to a skilled nursing facility  Discharge Diagnoses:  Principal Problem:  *Fracture of femoral neck, right: Patient was evaluated by medical service and cleared for operating room. She underwent hip repair on 2/1 by orthopedic surgery without complication. Postop, her out of bed mobility was limited by underlying Parkinson's disease. Plan will be for her to go to skilled nursing facility on 04/22/12 Active Problems:  UNSPECIFIED ANEMIA: Stable. Hemoglobin was monitored after surgery, and was down only to 12.5 on 2/3.  DEPRESSION: Stable. Continue home medications  PARKINSON'S DISEASE: Stable. Continued on home medications  HYPERTENSION: Stable  MYOCARDIAL INFARCTION, HX OF: Stable  Abnormality of gait: Because of underlying Parkinson's and deconditioning, patient was already quite weak. Hopefully with skilled nursing rehabilitation, she will actually be more functional than prior accident  Fracture of fifth metacarpal bone of right hand: Stable. The ulnar cast. Will work with rehabilitation.   Discharge Condition: Improved, being discharged to skilled nursing facility  Diet recommendation: Heart healthy  Filed Weights   04/20/12 1342  Weight: 56.7 kg (125 lb)    History of present illness:  Mallory Perez is a 79 y.o. female  With past history of CAD, Parkinson's disease and hypertension who has very limited mobility and ambulates minimally using a walker. Today she sustained a mechanical fall which he felt weak while prolonged standing and fell. She restart trying to get all of her walker slipped an injury to her right hand/fifth finger. She fell landing on her right side and was unable to stand. She did not lose consciousness. She  brought into the emergency room and evaluated and found to have a right femoral neck fracture as well as a right fifth metacarpal fracture. With the surgery evaluated the patient placed her hand and a right ulnar splint. [facility] were called for evaluation and admission.   Hospital Course:  Principal Problem:  *Fracture of femoral neck, right Active Problems:  UNSPECIFIED ANEMIA  DEPRESSION  PARKINSON'S DISEASE  HYPERTENSION  MYOCARDIAL INFARCTION, HX OF  Abnormality of gait  Fracture of fifth metacarpal bone of right hand   Procedures:  Status post right hip arthroplasty done 04/19/12  Consultations:  [facility]ucey-orthopedic surgery  Discharge Exam: Filed Vitals:   04/21/12 0000 04/21/12 0400 04/21/12 0635 04/21/12 0645  BP:    116/68  Pulse:  62 69 71  Temp:    99 F (37.2 C)  TempSrc:      Resp: 16 16 16 16   Height:      Weight:      SpO2: 98% 98% 98% 100%    General: Alert and oriented x3, fatigued, looks older than stated age Cardiovascular: Regular rate and rhythm, S1-S2, soft 2/6 systolic ejection murmur Respiratory: Clear to auscultation bilaterally, moderate inspiratory effort Abdomen: Soft, nontender, hypoactive bowel sounds, nondistended Extremities: Right hand in cast, no edema  Discharge Instructions  Discharge Orders    Future Orders Please Complete By Expires   Diet - low sodium heart healthy      Increase activity slowly          Medication List     As of 04/21/2012 11:16 AM    STOP taking these medications         OXYCODONE-ACETAMINOPHEN PO  TAKE these medications         amantadine 100 MG capsule   Commonly known as: SYMMETREL   Take 50-100 mg by mouth 2 (two) times daily. 100 mg every morning and 50 mg every evening.      atenolol 50 MG tablet   Commonly known as: TENORMIN   Take 25 mg by mouth every evening.      bisacodyl 5 MG EC tablet   Commonly known as: DULCOLAX   Take 1 tablet (5 mg total) by mouth daily as needed.       HYDROcodone-acetaminophen 5-325 MG per tablet   Commonly known as: NORCO/VICODIN   Take 1-2 tablets by mouth every 6 (six) hours as needed.      MELATONIN PO   Take 1 tablet by mouth at bedtime.      methocarbamol 500 MG tablet   Commonly known as: ROBAXIN   Take 1 tablet (500 mg total) by mouth every 6 (six) hours as needed.      zolpidem 5 MG tablet   Commonly known as: AMBIEN   Take 1 tablet (5 mg total) by mouth at bedtime as needed for sleep.          The results of significant diagnostics from this hospitalization (including imaging, microbiology, ancillary and laboratory) are listed below for reference.    Significant Diagnostic Studies: Dg Chest 1 View  04/19/2012   IMPRESSION:  1.  No acute cardiopulmonary disease. 2.  Aortic atherosclerosis. 3.  Mild pulmonary vascular congestion without edema.   Original Report Authenticated By: Malachy Moan, M.D.    Dg Hip Complete Right  04/19/2012  IMPRESSION: Minimally displaced fracture of the right femoral neck with foreshortening and angulation.   Original Report Authenticated By: Tacey Ruiz, MD    Dg Pelvis Portable  04/19/2012    IMPRESSION: Expected postoperative appearance of right hip prosthesis.  No evidence of fracture or dislocation.   Original Report Authenticated By: Myles Rosenthal, M.D.    Dg Hand Complete Right  04/19/2012   IMPRESSION: 1.  Minimally displaced fracture of the distal metaphysis of the fifth metacarpal.  Evaluation of the adjacent MCP joint is degraded secondary to obliquity.  2. No definite fracture of the middle digit though note, evaluation degraded secondary to obliquity.  Further evaluation with dedicated radiographs of this digit may be performed as clinically indicated.   Original Report Authenticated By: Tacey Ruiz, MD     Microbiology: Recent Results (from the past 240 hour(s))  MRSA PCR SCREENING     Status: Normal   Collection Time   04/19/12  4:45 PM      Component Value Range  Status Comment   MRSA by PCR NEGATIVE  NEGATIVE Final      Labs: Basic Metabolic Panel:  Lab 04/21/12 9604 04/20/12 0640 04/19/12 0934  NA 137 137 140  K 3.7 3.9 3.8  CL 106 104 104  CO2 25 23 26   GLUCOSE 98 113* 119*  BUN 21 22 19   CREATININE 0.79 1.05 0.69  CALCIUM 8.2* 8.2* 9.1  MG -- -- --  PHOS -- -- --   Liver Function Tests:  Lab 04/19/12 0934  AST 15  ALT 19  ALKPHOS 100  BILITOT 1.1  PROT 6.4  ALBUMIN 3.4*   CBC:  Lab 04/21/12 0705 04/20/12 0640 04/19/12 0934  WBC 9.9 8.6 10.3  NEUTROABS -- -- --  HGB 12.5 13.3 16.3*  HCT 38.2 40.1 48.8*  MCV 91.2 90.5  89.9  PLT 119* 123* 154      Signed:  Hollice Espy  [facility] 04/21/2012, 11:16 AM

## 2012-04-21 NOTE — Progress Notes (Signed)
Physical Therapy Treatment Patient Details Name: Mallory Perez Perez MRN: [id number] DOB: 03-Nov-1931 Today's Date: 04/21/2012 Time: 1478-2956 PT Time Calculation (min): 23 min  PT Assessment / Plan / Recommendation Comments on Treatment Session  pt presents with Fall resulting in R hip fx s/p THA and R 5th metacarpal fx.  pt very limited with mobility and requires 2 person A for all movement.  Husband present at end of session and notes that it has been getting hard for him to lift pt at home.      Follow Up Recommendations  SNF     Does the patient have the potential to tolerate intense rehabilitation     Barriers to Discharge        Equipment Recommendations  None recommended by PT    Recommendations for Other Services    Frequency Min 3X/week   Plan Discharge plan remains appropriate;Frequency remains appropriate    Precautions / Restrictions Precautions Precautions: Fall;Posterior Hip Precaution Booklet Issued: No Restrictions Weight Bearing Restrictions: Yes RLE Weight Bearing: Weight bearing as tolerated Other Position/Activity Restrictions: No order written, but R Hand/Wrist maintained in NWBing.     Pertinent Vitals/Pain Does not rate, but indicates pain with mobility.      Mobility  Bed Mobility Bed Mobility: Supine to Sit;Sitting - Scoot to Edge of Bed Supine to Sit: 1: +2 Total assist Supine to Sit: Patient Percentage: 20% Sitting - Scoot to Edge of Bed: 1: +2 Total assist Sitting - Scoot to Edge of Bed: Patient Percentage: 10% Details for Bed Mobility Assistance: cues for step-by-step through bed mobility.   Transfers Transfers: Sit to Stand;Stand to Sit;Stand Pivot Transfers Sit to Stand: 1: +2 Total assist;From bed Sit to Stand: Patient Percentage: 20% Stand to Sit: 1: +2 Total assist;To chair/3-in-1 Stand to Sit: Patient Percentage: 10% Stand Pivot Transfers: 1: +2 Total assist Stand Pivot Transfers: Patient Percentage: 10% Details for Transfer Assistance:  cues for use of UEs, positioning of LEs, step-by-step through transfer.  pt required PT's foot inbetween her feet to increase BOS.   Ambulation/Gait Ambulation/Gait Assistance: Not tested (comment) Stairs: No Wheelchair Mobility Wheelchair Mobility: No    Exercises     PT Diagnosis:    PT Problem List:   PT Treatment Interventions:     PT Goals Acute Rehab PT Goals Time For Goal Achievement: 05/04/12 Potential to Achieve Goals: Fair PT Goal: Supine/Side to Sit - Progress: Progressing toward goal PT Goal: Sit to Stand - Progress: Progressing toward goal PT Goal: Stand to Sit - Progress: Progressing toward goal PT Transfer Goal: Bed to Chair/Chair to Bed - Progress: Progressing toward goal  Visit Information  Last PT Received On: 04/21/12 Assistance Needed: +2 PT/OT Co-Evaluation/Treatment: Yes    Subjective Data  Subjective: I don't know if I can move much today.     Cognition  Cognition Overall Cognitive Status: History of cognitive impairments - at baseline Arousal/Alertness: Awake/alert Orientation Level: Disoriented to;Time Behavior During Session: Flat affect Cognition - Other Comments: pt with STM deficits and some difficulty with orientation.      Balance  Balance Balance Assessed: Yes Static Sitting Balance Static Sitting - Balance Support: Left upper extremity supported;Feet supported Static Sitting - Level of Assistance: 4: Min assist Static Sitting - Comment/# of Minutes: pt tends to lean towards L side while sitting unsupported on side of bed.    End of Session PT - End of Session Equipment Utilized During Treatment: Gait belt;Oxygen Activity Tolerance: Patient limited by fatigue;Patient limited by  pain Patient left: in chair;with call bell/phone within reach;with family/visitor present Nurse Communication: Mobility status   GP     [name], [place] [phone] 04/21/2012, 1:20 PM

## 2012-04-21 NOTE — Plan of Care (Signed)
Problem: Phase III Progression Outcomes Goal: Discharge plan remains appropriate-arrangements made Recommend SNF for further therapy needs after acute care d/c     

## 2012-04-21 NOTE — Progress Notes (Signed)
    PROGRESS NOTE  Subjective:  negative for Chest Pain  negative for Shortness of Breath  negative for Nausea/Vomiting   negative for Calf Pain  negative for Bowel Movement   Tolerating Diet: yes         Patient reports pain as 2 on 0-10 scale.    Objective: Vital signs in last 24 hours:   Patient Vitals for the past 24 hrs:  BP Temp Temp src Pulse Resp SpO2  04/21/12 1500 110/52 mmHg 99 F (37.2 C) - 78  16  97 %  04/21/12 0645 116/68 mmHg 99 F (37.2 C) - 71  16  100 %  04/21/12 0635 - - - 69  16  98 %  04/21/12 0400 - - - 62  16  98 %  04/21/12 0000 - - - - 16  98 %  04/20/12 2231 134/61 mmHg 99.7 F (37.6 C) - 80  16  98 %  04/20/12 2000 - - - - 16  98 %  04/20/12 1821 127/72 mmHg 99.5 F (37.5 C) Oral - 16  94 %    @flow {1959:LAST@   Intake/Output from previous day:   02/02 0701 - 02/03 0700 In: 480 [P.O.:480] Out: 1250 [Urine:1250]   Intake/Output this shift:   02/03 0701 - 02/03 1900 In: -  Out: 600 [Urine:600]   Intake/Output      02/02 0701 - 02/03 0700 02/03 0701 - 02/04 0700   P.O. 480    I.V. (mL/kg)     IV Piggyback     Total Intake(mL/kg) 480 (8.5)    Urine (mL/kg/hr) 1250 (0.9) 600 (1)   Blood     Total Output 1250 600   Net -770 -600           LABORATORY DATA:  Basename 04/21/12 0705 04/20/12 0640 04/19/12 0934  WBC 9.9 8.6 10.3  HGB 12.5 13.3 16.3*  HCT 38.2 40.1 48.8*  PLT 119* 123* 154    Basename 04/21/12 0705 04/20/12 0640 04/19/12 0934  NA 137 137 140  K 3.7 3.9 3.8  CL 106 104 104  CO2 25 23 26   BUN 21 22 19   CREATININE 0.79 1.05 0.69  GLUCOSE 98 113* 119*  CALCIUM 8.2* 8.2* 9.1   Lab Results  Component Value Date   INR 1.03 04/19/2012    Examination:  Hip looks great; no drainage; minimal swelling  Wound Exam: clean, dry, intact   Drainage:  None: wound tissue dry   Assessment:    2 Days Post-Op  Procedure(s) (LRB): ARTHROPLASTY BIPOLAR HIP (Right)  ADDITIONAL DIAGNOSIS:  Principal Problem:  *Fracture of femoral neck, right Active Problems:  UNSPECIFIED ANEMIA  DEPRESSION  PARKINSON'S DISEASE  HYPERTENSION  MYOCARDIAL INFARCTION, HX OF  Abnormality of gait  Fracture of fifth metacarpal bone of right hand   Plan: Physical Therapy as ordered Weight Bearing as Tolerated (WBAT)  DVT Prophylaxis:  Lovenox  DISCHARGE PLAN: Skilled Nursing Facility/Rehab; Follow Up with me in 2 weeks.           Tanaisha Pittman,STEPHEN D 04/21/2012, 6:03 PM

## 2012-04-21 NOTE — Progress Notes (Signed)
Foley catheter was not pulled because pt is unable to get up out of bed.  She is rigid in her movements and is unable to assist in moving. She was incontinent at home and wore depends. Will remove catheter after pt is seen by physical therapy this morning and the best way to move her can be determined. Pt requested catheter remain in for comfort. She was told that the catheter would have to be removed today.

## 2012-04-21 NOTE — Evaluation (Signed)
Occupational Therapy Evaluation Patient Details Name: Mallory Perez MRN: [id number] DOB: [birth year] Today's Date: 04/21/2012 Time: 4098-1191 OT Time Calculation (min): 55 min  OT Assessment / Plan / Recommendation Clinical Impression  Pt demos decline in function with ADLs, strength, ROM/UE function, balance, safety and activity tolerance following hip surgery and R hand fractures from falling at home. Pt would benefit from OT services to address these impairments to restore PLOF to return home safely    OT Assessment  Patient needs continued OT Services    Follow Up Recommendations  SNF    Barriers to Discharge Decreased caregiver support Pt's husband/family unable to provide adequate assist needed at home at this time  Equipment Recommendations  None recommended by OT;Other (comment) (TBD at SNF level of care)    Recommendations for Other Services    Frequency  Min 2X/week    Precautions / Restrictions Precautions Precautions: Posterior Hip;Fall Restrictions Weight Bearing Restrictions: Yes RLE Weight Bearing: Weight bearing as tolerated Other Position/Activity Restrictions: No orders for NWB R hand/wrist, will treat as NWB until further notice       ADL  Eating/Feeding: Minimal assistance;Other (comment) (cutting foods and opening containers) Where Assessed - Eating/Feeding: Bed level Grooming: Performed;Minimal assistance Where Assessed - Grooming: Supported sitting Upper Body Bathing: Maximal assistance Lower Body Bathing: +1 Total assistance Upper Body Dressing: Maximal assistance Lower Body Dressing: +1 Total assistance Toilet Transfer: Simulated;Maximal assistance Toileting - Clothing Manipulation and Hygiene: +1 Total assistance    OT Diagnosis: Generalized weakness;Acute pain  OT Problem List: Decreased strength;Impaired UE functional use;Pain;Decreased knowledge of precautions;Decreased knowledge of use of DME or AE;Impaired balance (sitting and/or  standing);Decreased activity tolerance;Decreased safety awareness;Decreased cognition OT Treatment Interventions: Self-care/ADL training;Therapeutic activities;Therapeutic exercise;Neuromuscular education;DME and/or AE instruction;Patient/family education;Balance training   OT Goals Acute Rehab OT Goals OT Goal Formulation: With patient/family Time For Goal Achievement: 04/28/12 Potential to Achieve Goals: Fair ADL Goals Pt Will Perform Grooming: with set-up;with supervision;Sitting, edge of bed;Sitting, chair;Unsupported ADL Goal: Grooming - Progress: Goal set today Pt Will Perform Upper Body Bathing: with mod assist;Sitting, chair;Sitting, edge of bed;Unsupported ADL Goal: Upper Body Bathing - Progress: Goal set today Pt Will Perform Lower Body Bathing: with max assist;with adaptive equipment;Sitting, chair;Sitting, edge of bed;Unsupported ADL Goal: Lower Body Bathing - Progress: Goal set today Pt Will Perform Upper Body Dressing: with mod assist;Unsupported;Sitting, chair;Sitting, bed ADL Goal: Upper Body Dressing - Progress: Goal set today Pt Will Perform Lower Body Dressing: with max assist;Sitting, chair;Sitting, bed;with adaptive equipment ADL Goal: Lower Body Dressing - Progress: Goal set today Pt Will Transfer to Toilet: with max assist;with 1+ total assist;with DME;Grab bars ADL Goal: Toilet Transfer - Progress: Goal set today  Visit Information  Last OT Received On: 04/21/12 PT/OT Co-Evaluation/Treatment: Yes    Subjective Data  Subjective: " I don't know how I got into this mess " Patient Stated Goal: To d/c to SNF for further therapy after acute care d/c   Prior Functioning     Home Living Lives With: Spouse Available Help at Discharge: Family;Available 24 hours/day Type of Home: House Home Layout: One level Bathroom Shower/Tub: Tub/shower unit;Walk-in shower Bathroom Toilet: Standard Bathroom Accessibility: Yes How Accessible: Accessible via walker Home  Adaptive Equipment: Walker - rolling;Bedside commode/3-in-1;Shower chair without back Prior Function Level of Independence: Needs assistance Needs Assistance: Bathing;Dressing;Meal Prep;Light Housekeeping;Gait;Transfers Bath: Moderate Dressing: Moderate Meal Prep: Total Light Housekeeping: Total Transfer Assistance: pt needs A to get in and out of bed, sit - stand from lift chair Able  to Take Stairs?: Yes (with assist) Vocation: Retired Musician: No difficulties Dominant Hand: Right         Vision/Perception Vision - History Baseline Vision: Wears glasses all the time Patient Visual Report: No change from baseline Vision - Assessment Eye Alignment: Within Functional Limits Perception Perception: Within Functional Limits   Cognition  Cognition Overall Cognitive Status: History of cognitive impairments - at baseline Arousal/Alertness: Awake/alert Orientation Level: Disoriented to;Time;Place Behavior During Session: Flat affect Cognition - Other Comments: pt demos STM deficits    Extremity/Trunk Assessment Right Upper Extremity Assessment RUE ROM/Strength/Tone: Due to precautions (AAROM elbow and shoulder WFL, stiffness and rigidity AROM) Left Upper Extremity Assessment LUE ROM/Strength/Tone: WFL for tasks assessed     Mobility Bed Mobility Bed Mobility: Supine to Sit;Sitting - Scoot to Edge of Bed Supine to Sit: 1: +2 Total assist Sitting - Scoot to Edge of Bed: 1: +2 Total assist Transfers Transfers: Sit to Stand;Stand to Sit Sit to Stand: 1: +2 Total assist Stand to Sit: 1: +2 Total assist     Exercise     Balance Balance Balance Assessed: No   End of Session OT - End of Session Equipment Utilized During Treatment: Gait belt Activity Tolerance: Patient limited by fatigue Patient left: in chair;with call bell/phone within reach;with family/visitor present  GO     Mallory Perez 04/21/2012, 12:50 PM

## 2012-04-22 ENCOUNTER — Encounter (HOSPITAL_COMMUNITY): Payer: Self-pay | Admitting: Orthopedic Surgery

## 2012-04-22 DIAGNOSIS — Z9181 History of falling: Secondary | ICD-10-CM | POA: Diagnosis not present

## 2012-04-22 DIAGNOSIS — S7290XA Unspecified fracture of unspecified femur, initial encounter for closed fracture: Secondary | ICD-10-CM | POA: Diagnosis not present

## 2012-04-22 DIAGNOSIS — M6281 Muscle weakness (generalized): Secondary | ICD-10-CM | POA: Diagnosis not present

## 2012-04-22 DIAGNOSIS — Z471 Aftercare following joint replacement surgery: Secondary | ICD-10-CM | POA: Diagnosis not present

## 2012-04-22 DIAGNOSIS — D649 Anemia, unspecified: Secondary | ICD-10-CM | POA: Diagnosis not present

## 2012-04-22 DIAGNOSIS — S72019A Unspecified intracapsular fracture of unspecified femur, initial encounter for closed fracture: Secondary | ICD-10-CM | POA: Diagnosis not present

## 2012-04-22 DIAGNOSIS — W19XXXA Unspecified fall, initial encounter: Secondary | ICD-10-CM | POA: Diagnosis not present

## 2012-04-22 DIAGNOSIS — S72009A Fracture of unspecified part of neck of unspecified femur, initial encounter for closed fracture: Secondary | ICD-10-CM | POA: Diagnosis not present

## 2012-04-22 DIAGNOSIS — M8448XD Pathological fracture, other site, subsequent encounter for fracture with routine healing: Secondary | ICD-10-CM | POA: Diagnosis not present

## 2012-04-22 DIAGNOSIS — S62309A Unspecified fracture of unspecified metacarpal bone, initial encounter for closed fracture: Secondary | ICD-10-CM | POA: Diagnosis not present

## 2012-04-22 DIAGNOSIS — R279 Unspecified lack of coordination: Secondary | ICD-10-CM | POA: Diagnosis not present

## 2012-04-22 DIAGNOSIS — M25519 Pain in unspecified shoulder: Secondary | ICD-10-CM | POA: Diagnosis not present

## 2012-04-22 DIAGNOSIS — Z4801 Encounter for change or removal of surgical wound dressing: Secondary | ICD-10-CM | POA: Diagnosis not present

## 2012-04-22 DIAGNOSIS — R262 Difficulty in walking, not elsewhere classified: Secondary | ICD-10-CM | POA: Diagnosis not present

## 2012-04-22 DIAGNOSIS — Z96649 Presence of unspecified artificial hip joint: Secondary | ICD-10-CM | POA: Diagnosis not present

## 2012-04-22 DIAGNOSIS — I10 Essential (primary) hypertension: Secondary | ICD-10-CM | POA: Diagnosis not present

## 2012-04-22 DIAGNOSIS — G20 Parkinson's disease: Secondary | ICD-10-CM | POA: Diagnosis not present

## 2012-04-22 DIAGNOSIS — R609 Edema, unspecified: Secondary | ICD-10-CM | POA: Diagnosis not present

## 2012-04-22 DIAGNOSIS — F329 Major depressive disorder, single episode, unspecified: Secondary | ICD-10-CM | POA: Diagnosis not present

## 2012-04-22 DIAGNOSIS — R269 Unspecified abnormalities of gait and mobility: Secondary | ICD-10-CM | POA: Diagnosis not present

## 2012-04-22 DIAGNOSIS — Z4789 Encounter for other orthopedic aftercare: Secondary | ICD-10-CM | POA: Diagnosis not present

## 2012-04-22 DIAGNOSIS — M25559 Pain in unspecified hip: Secondary | ICD-10-CM | POA: Diagnosis not present

## 2012-04-22 LAB — CBC
HCT: 36.9 % (ref 36.0–46.0)
Hemoglobin: 12.2 g/dL (ref 12.0–15.0)
MCV: 90.2 fL (ref 78.0–100.0)
RBC: 4.09 MIL/uL (ref 3.87–5.11)
RDW: 13.9 % (ref 11.5–15.5)
WBC: 8 10*3/uL (ref 4.0–10.5)

## 2012-04-22 MED ORDER — POLYETHYLENE GLYCOL 3350 17 G PO PACK: 17.0000 g | PACK | Freq: Two times a day (BID) | ORAL | Status: DC

## 2012-04-22 MED ORDER — ENOXAPARIN SODIUM 40 MG/0.4ML ~~LOC~~ SOLN: 40.0000 mg | SUBCUTANEOUS | Status: DC

## 2012-04-22 NOTE — Progress Notes (Signed)
SPORTS MEDICINE AND JOINT REPLACEMENT  Georgena Spurling, MD   Altamese Cabal, PA-C [address] [place], [place], [place]  [address]                             [phone]   PROGRESS NOTE  Subjective:  negative for Chest Pain  negative for Shortness of Breath  negative for Nausea/Vomiting   negative for Calf Pain  negative for Bowel Movement   Tolerating Diet: yes         Patient reports pain as 2 on 0-10 scale.    Objective: Vital signs in last 24 hours:   Patient Vitals for the past 24 hrs:  BP Temp Temp src Pulse Resp SpO2  04/22/12 0624 119/80 mmHg 98.9 F (37.2 C) - 52  20  95 %  04/21/12 2038 115/60 mmHg 98.5 F (36.9 C) Oral 74  16  95 %  04/21/12 1500 110/52 mmHg 99 F (37.2 C) - 78  16  97 %    @flow {1959:LAST@   Intake/Output from previous day:   02/03 0701 - 02/04 0700 In: -  Out: 1100 [Urine:1100]   Intake/Output this shift:       Intake/Output      02/03 0701 - 02/04 0700 02/04 0701 - 02/05 0700   P.O.     Total Intake(mL/kg)     Urine (mL/kg/hr) 1100 (0.8)    Total Output 1100    Net -1100            LABORATORY DATA:  Basename 04/22/12 0410 04/21/12 0705 04/20/12 0640 04/19/12 0934  WBC 8.0 9.9 8.6 10.3  HGB 12.2 12.5 13.3 16.3*  HCT 36.9 38.2 40.1 48.8*  PLT 134* 119* 123* 154    Basename 04/21/12 0705 04/20/12 0640 04/19/12 0934  NA 137 137 140  K 3.7 3.9 3.8  CL 106 104 104  CO2 25 23 26   BUN 21 22 19   CREATININE 0.79 1.05 0.69  GLUCOSE 98 113* 119*  CALCIUM 8.2* 8.2* 9.1   Lab Results  Component Value Date   INR 1.03 04/19/2012    Examination:  General appearance: alert, cooperative and no distress Extremities: Homans sign is negative, no sign of DVT  Wound Exam: clean, dry, intact   Drainage:  None: wound tissue dry  Motor Exam:  Intact  Sensory Exam: Deep Peroneal normal   Assessment:    3 Days Post-Op  Procedure(s) (LRB): ARTHROPLASTY BIPOLAR HIP (Right)  ADDITIONAL DIAGNOSIS:  Principal Problem:  *Fracture  of femoral neck, right Active Problems:  UNSPECIFIED ANEMIA  DEPRESSION  PARKINSON'S DISEASE  HYPERTENSION  MYOCARDIAL INFARCTION, HX OF  Abnormality of gait  Fracture of fifth metacarpal bone of right hand  Acute Blood Loss Anemia   Plan: Physical Therapy as ordered Weight Bearing as Tolerated (WBAT)  DVT Prophylaxis:  Lovenox  DISCHARGE PLAN: Skilled Nursing Facility/Rehab  DISCHARGE NEEDS: HHPT, CPM, Walker and 3-in-1 comode seat         Govind Furey 04/22/2012, 12:51 PM

## 2012-04-22 NOTE — Progress Notes (Signed)
OT Cancellation Note  Patient Details Name: Mallory Perez MRN: [id number] DOB: 1932/01/30   Cancelled Treatment:     Pt pleasantly declined OT tx today as she is scheduled to d/c to SNF today  Galen Manila 04/22/2012, 1:23 PM

## 2012-04-22 NOTE — Clinical Social Work Note (Signed)
CSW was consulted to complete discharge of patient. Pt to transfer to [facility] today via [facility] at 1500. Facility and spouse are aware of d/c. D/C packet complete with chart copy, signed FL2, and signed hard Rx.  CSW signing off as no other CSW needs identified at this time.  Lia Foyer, LCSWA [facility] Clinical Social Worker Contact #: [phone]

## 2012-04-22 NOTE — Progress Notes (Signed)
Patient seen and examined. Feeling well. Denies SOB, abdominal pain. She is passing gas. Will give one time dose miralax. Please give stool softener as needed.  Hartley Barefoot, MD.

## 2012-04-22 NOTE — Consult Note (Addendum)
WOC consult Note Reason for Consult: Consult requested for right hand.  Ortho following for hip and hand plan of care.  Requested to dress wounds which occurred during a fall. Wound type:partail thickness skin tears to 2nd finger and anterior hand.   Measurement: 2nd finger .5X.5X.1cm, hand 1X1X.1cm Wound bed: both sites with pink wound bed, skin approximated over 90% of wound bed. Drainage (amount, consistency, odor) small yellow drainage, no odor Periwound: bruising surrounding skin tears. Dressing procedure/placement/frequency: Applied foam dressings to protect and promote healing to skin tears.  Replaced splint to right arm with ace wrap. Pt can follow-up with ortho service for further plan of care after discharge. Will not plan to follow further unless re-consulted.  [address], RN, MSN, [employer]  [phone]

## 2012-04-22 NOTE — Discharge Summary (Signed)
SPORTS MEDICINE & JOINT REPLACEMENT   Georgena Spurling, MD   Altamese Cabal, PA-C [address] [place], [place], [place]  [address]                             [phone]  PATIENT ID: Mallory Perez        MRN:  [id number]          DOB/AGE: 77-Jun-1933 / 77 y.o.    DISCHARGE SUMMARY  ADMISSION DATE:    04/19/2012 DISCHARGE DATE:   04/22/2012   ADMISSION DIAGNOSIS: Hip fracture [820.8] Parkinson disease [332.0] Fall [E888.9] Metacarpal bone fracture [815.00] Hip fracture  fx    DISCHARGE DIAGNOSIS:  right hip fracture    ADDITIONAL DIAGNOSIS: Principal Problem:  *Fracture of femoral neck, right Active Problems:  UNSPECIFIED ANEMIA  DEPRESSION  PARKINSON'S DISEASE  HYPERTENSION  MYOCARDIAL INFARCTION, HX OF  Abnormality of gait  Fracture of fifth metacarpal bone of right hand  Past Medical History  Diagnosis Date  . Depression     s/p ECT therapy  . Hypertension   . Myocardial infarction 1987  . Parkinson's disease   . Fall at home     PROCEDURE: Procedure(s): ARTHROPLASTY BIPOLAR HIP on 04/19/2012  CONSULTS:     HISTORY:  See H&P in chart  HOSPITAL COURSE:  LAKEISHA WALDROP is a 79 y.o. admitted on 04/19/2012 and found to have a diagnosis of right hip fracture.  After appropriate laboratory studies were obtained  they were taken to the operating room on 04/19/2012 and underwent Procedure(s): ARTHROPLASTY BIPOLAR HIP.   They were given perioperative antibiotics:  Anti-infectives     Start     Dose/Rate Route Frequency Ordered Stop   04/20/12 0600   ceFAZolin (ANCEF) IVPB 2 g/50 mL premix        2 g 100 mL/hr over 30 Minutes Intravenous On call to O.R. 04/19/12 1353 04/19/12 1733   04/19/12 2300   ceFAZolin (ANCEF) IVPB 2 g/50 mL premix        2 g 100 mL/hr over 30 Minutes Intravenous Every 6 hours 04/19/12 2100 04/20/12 0528        .  Tolerated the procedure well.  Placed with a foley intraoperatively.  Given Ofirmev at induction and for 48 hours.    POD# 1:  Vital signs were stable.  Patient denied Chest pain, shortness of breath, or calf pain.  Patient was started on Lovenox 40mg   daily at 8am.  Consults to PT, OT, and care management were made.  The patient was weight bearing as tolerated.  Dressing was changed.  Marcaine pump and hemovac were discontinued.      POD #2, Continued  PT for ambulation and exercise program.  IV saline locked.  O2 discontinued.    The remainder of the hospital course was dedicated to ambulation and strengthening.   The patient was discharged on 3 Days Post-Op in  Good condition.  Blood products given:none  DIAGNOSTIC STUDIES: Recent vital signs: Patient Vitals for the past 24 hrs:  BP Temp Temp src Pulse Resp SpO2  04/22/12 0624 119/80 mmHg 98.9 F (37.2 C) - 52  20  95 %  04-25-2012 2038 115/60 mmHg 98.5 F (36.9 C) Oral 77  16  95 %  04/25/2012 1500 110/52 mmHg 99 F (37.2 C) - 78  16  97 %       Recent laboratory studies:  Basename 04/22/12 0410 04-25-2012 2130  04/20/12 0640 04/19/12 0934  WBC 8.0 9.9 8.6 10.3  HGB 12.2 12.5 13.3 16.3*  HCT 36.9 38.2 40.1 48.8*  PLT 134* 119* 123* 154    Basename 04/21/12 0705 04/20/12 0640 04/19/12 0934  NA 137 137 140  K 3.7 3.9 3.8  CL 106 104 104  CO2 25 23 26   BUN 21 22 19   CREATININE 0.79 1.05 0.69  GLUCOSE 98 113* 119*  CALCIUM 8.2* 8.2* 9.1   Lab Results  Component Value Date   INR 1.03 04/19/2012     Recent Radiographic Studies :  Dg Chest 1 View  04/19/2012  *RADIOLOGY REPORT*  Clinical Data: Preoperative evaluation, hip fracture  CHEST - 1 VIEW  Comparison: None.  Findings: Atherosclerotic and tortuous thoracic aorta.  The cardiac size is within normal limits.  Prominence of the right paratracheal stripe likely reflects underlying vascular structures.  There is mild central vascular congestion without overt edema.  No pneumothorax, pleural effusion or focal airspace consolidation. The bones diffusely osteopenic.  Mild dextroconvex scoliosis of the  thoracic spine.  Incompletely imaged posterior lumbar fixation hardware.  IMPRESSION:  1.  No acute cardiopulmonary disease. 2.  Aortic atherosclerosis. 3.  Mild pulmonary vascular congestion without edema.   Original Report Authenticated By: Malachy Moan, M.D.    Dg Hip Complete Right  04/19/2012  *RADIOLOGY REPORT*  Clinical Data: Post fall, now with right hip pain  RIGHT HIP - COMPLETE 2+ VIEW  Comparison: None.  Findings:  There is a minimally displaced fracture of the right femoral neck with foreshortening and angulation.  No definite pelvic fracture. Limited visualization of the contralateral left hip is normal. Post surgical change of the lower lumbar spine, incompletely evaluated.  Multiple phleboliths overlie the lower pelvis.  No radiopaque foreign body.  IMPRESSION: Minimally displaced fracture of the right femoral neck with foreshortening and angulation.   Original Report Authenticated By: Tacey Ruiz, MD    Dg Pelvis Portable  04/19/2012  *RADIOLOGY REPORT*  Clinical Data: Postop from right hip arthroplasty.  PORTABLE PELVIS  Comparison: None.  Findings: Right hip prosthesis is seen in the expected position. No evidence of fracture or dislocation.  Generalized osteopenia noted.  Skin staples seen overlying the right hip.  IMPRESSION: Expected postoperative appearance of right hip prosthesis.  No evidence of fracture or dislocation.   Original Report Authenticated By: Myles Rosenthal, M.D.    Dg Hand Complete Right  04/19/2012  *RADIOLOGY REPORT*  Clinical Data: Post fall, now with hand pain, especially the middle finger  RIGHT HAND - COMPLETE 3+ VIEW  Comparison: None.  Findings:  Diffuse osteopenia.  There is mild windswept deformity of all the MCP joints with varus angulation.  There is a minimally displaced fracture of the distal metaphysis of the fifth metacarpal with angulation.  Evaluation of the adjacent fifth MCP joint is degraded secondary to obliquity.  No definite fracture of the middle  finger though evaluation degraded secondary to obliquity.  Mild diffuse soft tissue swelling especially about the ulnar aspect of the hand.  No radiopaque foreign body.  IMPRESSION: 1.  Minimally displaced fracture of the distal metaphysis of the fifth metacarpal.  Evaluation of the adjacent MCP joint is degraded secondary to obliquity.  2. No definite fracture of the middle digit though note, evaluation degraded secondary to obliquity.  Further evaluation with dedicated radiographs of this digit may be performed as clinically indicated.   Original Report Authenticated By: Tacey Ruiz, MD     DISCHARGE  INSTRUCTIONS: Discharge Orders    Future Orders Please Complete By Expires   Diet - low sodium heart healthy      Diet - low sodium heart healthy      Diet - low sodium heart healthy      Increase activity slowly      Increase activity slowly      Call MD / Call 911      Comments:   If you experience chest pain or shortness of breath, CALL 911 and be transported to the hospital emergency room.  If you develope a fever above 101 F, pus (white drainage) or increased drainage or redness at the wound, or calf pain, call your surgeon's office.   Constipation Prevention      Comments:   Drink plenty of fluids.  Prune juice may be helpful.  You may use a stool softener, such as Colace (over the counter) 100 mg twice a day.  Use MiraLax (over the counter) for constipation as needed.   Increase activity slowly as tolerated      Weight bearing as tolerated      Driving restrictions      Comments:   No driving for 6 weeks   Lifting restrictions      Comments:   No lifting for 6 weeks   Do not sit on low chairs, stoools or toilet seats, as it may be difficult to get up from low surfaces      Discharge wound care:      Comments:   If you have a hip bandage, keep it clean and dry.  Change your bandage as instructed by your health care providers.  If your bandage has been discontinued, keep your incision  clean and dry.  Pat dry after bathing.  DO NOT put lotion or powder on your incision.      DISCHARGE MEDICATIONS:     Medication List     As of 04/22/2012  1:00 PM    STOP taking these medications         OXYCODONE-ACETAMINOPHEN PO      TAKE these medications         amantadine 100 MG capsule   Commonly known as: SYMMETREL   Take 50-100 mg by mouth 2 (two) times daily. 100 mg every morning and 50 mg every evening.      atenolol 50 MG tablet   Commonly known as: TENORMIN   Take 25 mg by mouth every evening.      bisacodyl 5 MG EC tablet   Commonly known as: DULCOLAX   Take 1 tablet (5 mg total) by mouth daily as needed.      enoxaparin 40 MG/0.4ML injection   Commonly known as: LOVENOX   Inject 0.4 mLs (40 mg total) into the skin daily.      HYDROcodone-acetaminophen 5-325 MG per tablet   Commonly known as: NORCO/VICODIN   Take 1-2 tablets by mouth every 6 (six) hours as needed.      MELATONIN PO   Take 1 tablet by mouth at bedtime.      methocarbamol 500 MG tablet   Commonly known as: ROBAXIN   Take 1 tablet (500 mg total) by mouth every 6 (six) hours as needed.      zolpidem 5 MG tablet   Commonly known as: AMBIEN   Take 1 tablet (5 mg total) by mouth at bedtime as needed for sleep.        FOLLOW UP VISIT:  Follow-up Information    Follow up with Raymon Mutton, MD. Call on 05/01/2012.   Contact information:   [address] [place] [address] [phone]          DISPOSITION: SNF  CONDITION:  {Good  Sharica Roedel 04/22/2012, 1:00 PM

## 2012-04-25 DIAGNOSIS — G20 Parkinson's disease: Secondary | ICD-10-CM | POA: Diagnosis not present

## 2012-04-25 DIAGNOSIS — S72019A Unspecified intracapsular fracture of unspecified femur, initial encounter for closed fracture: Secondary | ICD-10-CM | POA: Diagnosis not present

## 2012-04-25 DIAGNOSIS — S62309A Unspecified fracture of unspecified metacarpal bone, initial encounter for closed fracture: Secondary | ICD-10-CM | POA: Diagnosis not present

## 2012-04-25 DIAGNOSIS — Z4789 Encounter for other orthopedic aftercare: Secondary | ICD-10-CM | POA: Diagnosis not present

## 2012-04-25 DIAGNOSIS — I10 Essential (primary) hypertension: Secondary | ICD-10-CM | POA: Diagnosis not present

## 2012-05-06 DIAGNOSIS — M25519 Pain in unspecified shoulder: Secondary | ICD-10-CM | POA: Diagnosis not present

## 2012-05-06 DIAGNOSIS — S62309A Unspecified fracture of unspecified metacarpal bone, initial encounter for closed fracture: Secondary | ICD-10-CM | POA: Diagnosis not present

## 2012-05-06 DIAGNOSIS — M25559 Pain in unspecified hip: Secondary | ICD-10-CM | POA: Diagnosis not present

## 2012-05-06 DIAGNOSIS — Z471 Aftercare following joint replacement surgery: Secondary | ICD-10-CM | POA: Diagnosis not present

## 2012-05-06 DIAGNOSIS — Z96649 Presence of unspecified artificial hip joint: Secondary | ICD-10-CM | POA: Diagnosis not present

## 2012-05-27 DIAGNOSIS — R609 Edema, unspecified: Secondary | ICD-10-CM | POA: Diagnosis not present

## 2012-05-29 ENCOUNTER — Ambulatory Visit: Payer: Medicare Other | Admitting: Family Medicine

## 2012-06-17 DIAGNOSIS — Z4801 Encounter for change or removal of surgical wound dressing: Secondary | ICD-10-CM | POA: Diagnosis not present

## 2012-06-17 DIAGNOSIS — D649 Anemia, unspecified: Secondary | ICD-10-CM | POA: Diagnosis not present

## 2012-06-17 DIAGNOSIS — R262 Difficulty in walking, not elsewhere classified: Secondary | ICD-10-CM | POA: Diagnosis not present

## 2012-06-17 DIAGNOSIS — R269 Unspecified abnormalities of gait and mobility: Secondary | ICD-10-CM | POA: Diagnosis not present

## 2012-06-17 DIAGNOSIS — M6281 Muscle weakness (generalized): Secondary | ICD-10-CM | POA: Diagnosis not present

## 2012-06-17 DIAGNOSIS — Z9181 History of falling: Secondary | ICD-10-CM | POA: Diagnosis not present

## 2012-06-17 DIAGNOSIS — M8448XD Pathological fracture, other site, subsequent encounter for fracture with routine healing: Secondary | ICD-10-CM | POA: Diagnosis not present

## 2012-06-17 DIAGNOSIS — R279 Unspecified lack of coordination: Secondary | ICD-10-CM | POA: Diagnosis not present

## 2012-06-17 DIAGNOSIS — Z471 Aftercare following joint replacement surgery: Secondary | ICD-10-CM | POA: Diagnosis not present

## 2012-06-17 DIAGNOSIS — I10 Essential (primary) hypertension: Secondary | ICD-10-CM | POA: Diagnosis not present

## 2012-06-17 DIAGNOSIS — G20 Parkinson's disease: Secondary | ICD-10-CM | POA: Diagnosis not present

## 2012-06-17 DIAGNOSIS — F329 Major depressive disorder, single episode, unspecified: Secondary | ICD-10-CM | POA: Diagnosis not present

## 2012-07-03 ENCOUNTER — Encounter: Payer: Self-pay | Admitting: Nurse Practitioner

## 2012-07-03 MED ORDER — METHOCARBAMOL 500 MG PO TABS: 500.0000 mg | ORAL_TABLET | Freq: Four times a day (QID) | ORAL | Status: DC | PRN

## 2012-07-03 MED ORDER — AMANTADINE HCL 100 MG PO TABS: ORAL_TABLET | ORAL | Status: DC

## 2012-07-03 MED ORDER — FUROSEMIDE 20 MG PO TABS: ORAL_TABLET | ORAL | Status: DC

## 2012-07-03 MED ORDER — POTASSIUM CHLORIDE ER 10 MEQ PO TBCR: 10.0000 meq | EXTENDED_RELEASE_TABLET | Freq: Every day | ORAL | Status: DC

## 2012-07-03 MED ORDER — ATENOLOL 50 MG PO TABS: 25.0000 mg | ORAL_TABLET | Freq: Every evening | ORAL | Status: DC

## 2012-07-06 DIAGNOSIS — D649 Anemia, unspecified: Secondary | ICD-10-CM | POA: Diagnosis not present

## 2012-07-06 DIAGNOSIS — Z96649 Presence of unspecified artificial hip joint: Secondary | ICD-10-CM | POA: Diagnosis not present

## 2012-07-06 DIAGNOSIS — IMO0001 Reserved for inherently not codable concepts without codable children: Secondary | ICD-10-CM | POA: Diagnosis not present

## 2012-07-06 DIAGNOSIS — G20 Parkinson's disease: Secondary | ICD-10-CM | POA: Diagnosis not present

## 2012-07-06 DIAGNOSIS — Z471 Aftercare following joint replacement surgery: Secondary | ICD-10-CM | POA: Diagnosis not present

## 2012-07-09 DIAGNOSIS — D649 Anemia, unspecified: Secondary | ICD-10-CM | POA: Diagnosis not present

## 2012-07-09 DIAGNOSIS — IMO0001 Reserved for inherently not codable concepts without codable children: Secondary | ICD-10-CM | POA: Diagnosis not present

## 2012-07-09 DIAGNOSIS — G20 Parkinson's disease: Secondary | ICD-10-CM | POA: Diagnosis not present

## 2012-07-09 DIAGNOSIS — Z96649 Presence of unspecified artificial hip joint: Secondary | ICD-10-CM | POA: Diagnosis not present

## 2012-07-09 DIAGNOSIS — Z471 Aftercare following joint replacement surgery: Secondary | ICD-10-CM | POA: Diagnosis not present

## 2012-07-11 DIAGNOSIS — G20 Parkinson's disease: Secondary | ICD-10-CM | POA: Diagnosis not present

## 2012-07-11 DIAGNOSIS — D649 Anemia, unspecified: Secondary | ICD-10-CM | POA: Diagnosis not present

## 2012-07-11 DIAGNOSIS — Z471 Aftercare following joint replacement surgery: Secondary | ICD-10-CM | POA: Diagnosis not present

## 2012-07-11 DIAGNOSIS — Z96649 Presence of unspecified artificial hip joint: Secondary | ICD-10-CM | POA: Diagnosis not present

## 2012-07-11 DIAGNOSIS — IMO0001 Reserved for inherently not codable concepts without codable children: Secondary | ICD-10-CM | POA: Diagnosis not present

## 2012-07-14 DIAGNOSIS — D649 Anemia, unspecified: Secondary | ICD-10-CM | POA: Diagnosis not present

## 2012-07-14 DIAGNOSIS — Z471 Aftercare following joint replacement surgery: Secondary | ICD-10-CM | POA: Diagnosis not present

## 2012-07-14 DIAGNOSIS — G20 Parkinson's disease: Secondary | ICD-10-CM | POA: Diagnosis not present

## 2012-07-14 DIAGNOSIS — Z96649 Presence of unspecified artificial hip joint: Secondary | ICD-10-CM | POA: Diagnosis not present

## 2012-07-14 DIAGNOSIS — IMO0001 Reserved for inherently not codable concepts without codable children: Secondary | ICD-10-CM | POA: Diagnosis not present

## 2012-07-15 DIAGNOSIS — G20 Parkinson's disease: Secondary | ICD-10-CM | POA: Diagnosis not present

## 2012-07-15 DIAGNOSIS — Z96649 Presence of unspecified artificial hip joint: Secondary | ICD-10-CM | POA: Diagnosis not present

## 2012-07-15 DIAGNOSIS — D649 Anemia, unspecified: Secondary | ICD-10-CM | POA: Diagnosis not present

## 2012-07-15 DIAGNOSIS — IMO0001 Reserved for inherently not codable concepts without codable children: Secondary | ICD-10-CM | POA: Diagnosis not present

## 2012-07-15 DIAGNOSIS — Z471 Aftercare following joint replacement surgery: Secondary | ICD-10-CM | POA: Diagnosis not present

## 2012-07-16 DIAGNOSIS — IMO0001 Reserved for inherently not codable concepts without codable children: Secondary | ICD-10-CM | POA: Diagnosis not present

## 2012-07-16 DIAGNOSIS — G20 Parkinson's disease: Secondary | ICD-10-CM | POA: Diagnosis not present

## 2012-07-16 DIAGNOSIS — Z96649 Presence of unspecified artificial hip joint: Secondary | ICD-10-CM | POA: Diagnosis not present

## 2012-07-16 DIAGNOSIS — Z471 Aftercare following joint replacement surgery: Secondary | ICD-10-CM | POA: Diagnosis not present

## 2012-07-16 DIAGNOSIS — D649 Anemia, unspecified: Secondary | ICD-10-CM | POA: Diagnosis not present

## 2012-07-17 DIAGNOSIS — IMO0001 Reserved for inherently not codable concepts without codable children: Secondary | ICD-10-CM | POA: Diagnosis not present

## 2012-07-17 DIAGNOSIS — Z96649 Presence of unspecified artificial hip joint: Secondary | ICD-10-CM | POA: Diagnosis not present

## 2012-07-17 DIAGNOSIS — Z471 Aftercare following joint replacement surgery: Secondary | ICD-10-CM | POA: Diagnosis not present

## 2012-07-17 DIAGNOSIS — G20 Parkinson's disease: Secondary | ICD-10-CM | POA: Diagnosis not present

## 2012-07-17 DIAGNOSIS — D649 Anemia, unspecified: Secondary | ICD-10-CM | POA: Diagnosis not present

## 2012-07-18 ENCOUNTER — Telehealth: Payer: Self-pay

## 2012-07-18 DIAGNOSIS — Z471 Aftercare following joint replacement surgery: Secondary | ICD-10-CM | POA: Diagnosis not present

## 2012-07-18 DIAGNOSIS — IMO0001 Reserved for inherently not codable concepts without codable children: Secondary | ICD-10-CM | POA: Diagnosis not present

## 2012-07-18 DIAGNOSIS — G20 Parkinson's disease: Secondary | ICD-10-CM | POA: Diagnosis not present

## 2012-07-18 DIAGNOSIS — D649 Anemia, unspecified: Secondary | ICD-10-CM | POA: Diagnosis not present

## 2012-07-18 DIAGNOSIS — Z96649 Presence of unspecified artificial hip joint: Secondary | ICD-10-CM | POA: Diagnosis not present

## 2012-07-18 NOTE — Telephone Encounter (Signed)
Mallory Perez with [facility] who is in the home now said BP has been running elevated for last 3 days. Today 12:30 BP 195/106 P 74; 12:50 PM BP 154/106 P 70;   1:20 pm BP 137/110 P 62;  2:00pm BP 152/105 P 97 and 2:15 pm BP 180/98 P 96. No h/a, dizziness,CP or SOB. Pt does get winded when talks. Pt taking Atenolol 50 mg 1/2 tab daily.Please advise.

## 2012-07-18 NOTE — Telephone Encounter (Signed)
I would be hesitant to increase her medication as some of systolic readings are normal and causing hypotension in a pt with increased fall risk is dangerous.  Keep monitoring.  If she becomes symptomatic over weekend, take her to ER.

## 2012-07-18 NOTE — Telephone Encounter (Signed)
Advised patient's caregiver, Aurea Graff, as instructed.

## 2012-07-21 DIAGNOSIS — Z96649 Presence of unspecified artificial hip joint: Secondary | ICD-10-CM | POA: Diagnosis not present

## 2012-07-21 DIAGNOSIS — IMO0001 Reserved for inherently not codable concepts without codable children: Secondary | ICD-10-CM | POA: Diagnosis not present

## 2012-07-21 DIAGNOSIS — Z471 Aftercare following joint replacement surgery: Secondary | ICD-10-CM | POA: Diagnosis not present

## 2012-07-21 DIAGNOSIS — G20 Parkinson's disease: Secondary | ICD-10-CM | POA: Diagnosis not present

## 2012-07-21 DIAGNOSIS — D649 Anemia, unspecified: Secondary | ICD-10-CM | POA: Diagnosis not present

## 2012-07-22 DIAGNOSIS — IMO0001 Reserved for inherently not codable concepts without codable children: Secondary | ICD-10-CM | POA: Diagnosis not present

## 2012-07-22 DIAGNOSIS — Z96649 Presence of unspecified artificial hip joint: Secondary | ICD-10-CM | POA: Diagnosis not present

## 2012-07-22 DIAGNOSIS — G20 Parkinson's disease: Secondary | ICD-10-CM | POA: Diagnosis not present

## 2012-07-22 DIAGNOSIS — Z471 Aftercare following joint replacement surgery: Secondary | ICD-10-CM | POA: Diagnosis not present

## 2012-07-22 DIAGNOSIS — D649 Anemia, unspecified: Secondary | ICD-10-CM | POA: Diagnosis not present

## 2012-07-23 DIAGNOSIS — D649 Anemia, unspecified: Secondary | ICD-10-CM | POA: Diagnosis not present

## 2012-07-23 DIAGNOSIS — G20 Parkinson's disease: Secondary | ICD-10-CM | POA: Diagnosis not present

## 2012-07-23 DIAGNOSIS — Z96649 Presence of unspecified artificial hip joint: Secondary | ICD-10-CM | POA: Diagnosis not present

## 2012-07-23 DIAGNOSIS — IMO0001 Reserved for inherently not codable concepts without codable children: Secondary | ICD-10-CM | POA: Diagnosis not present

## 2012-07-23 DIAGNOSIS — Z471 Aftercare following joint replacement surgery: Secondary | ICD-10-CM | POA: Diagnosis not present

## 2012-07-24 DIAGNOSIS — D649 Anemia, unspecified: Secondary | ICD-10-CM | POA: Diagnosis not present

## 2012-07-24 DIAGNOSIS — Z471 Aftercare following joint replacement surgery: Secondary | ICD-10-CM | POA: Diagnosis not present

## 2012-07-24 DIAGNOSIS — Z96649 Presence of unspecified artificial hip joint: Secondary | ICD-10-CM | POA: Diagnosis not present

## 2012-07-24 DIAGNOSIS — G20 Parkinson's disease: Secondary | ICD-10-CM | POA: Diagnosis not present

## 2012-07-24 DIAGNOSIS — IMO0001 Reserved for inherently not codable concepts without codable children: Secondary | ICD-10-CM | POA: Diagnosis not present

## 2012-07-25 DIAGNOSIS — Z96649 Presence of unspecified artificial hip joint: Secondary | ICD-10-CM | POA: Diagnosis not present

## 2012-07-25 DIAGNOSIS — IMO0001 Reserved for inherently not codable concepts without codable children: Secondary | ICD-10-CM | POA: Diagnosis not present

## 2012-07-25 DIAGNOSIS — Z471 Aftercare following joint replacement surgery: Secondary | ICD-10-CM | POA: Diagnosis not present

## 2012-07-25 DIAGNOSIS — D649 Anemia, unspecified: Secondary | ICD-10-CM | POA: Diagnosis not present

## 2012-07-25 DIAGNOSIS — G20 Parkinson's disease: Secondary | ICD-10-CM | POA: Diagnosis not present

## 2012-07-28 DIAGNOSIS — D649 Anemia, unspecified: Secondary | ICD-10-CM | POA: Diagnosis not present

## 2012-07-28 DIAGNOSIS — IMO0001 Reserved for inherently not codable concepts without codable children: Secondary | ICD-10-CM | POA: Diagnosis not present

## 2012-07-28 DIAGNOSIS — Z96649 Presence of unspecified artificial hip joint: Secondary | ICD-10-CM | POA: Diagnosis not present

## 2012-07-28 DIAGNOSIS — Z471 Aftercare following joint replacement surgery: Secondary | ICD-10-CM | POA: Diagnosis not present

## 2012-07-28 DIAGNOSIS — G20 Parkinson's disease: Secondary | ICD-10-CM | POA: Diagnosis not present

## 2012-07-29 DIAGNOSIS — Z96649 Presence of unspecified artificial hip joint: Secondary | ICD-10-CM | POA: Diagnosis not present

## 2012-07-29 DIAGNOSIS — IMO0001 Reserved for inherently not codable concepts without codable children: Secondary | ICD-10-CM | POA: Diagnosis not present

## 2012-07-29 DIAGNOSIS — G20 Parkinson's disease: Secondary | ICD-10-CM | POA: Diagnosis not present

## 2012-07-29 DIAGNOSIS — Z471 Aftercare following joint replacement surgery: Secondary | ICD-10-CM | POA: Diagnosis not present

## 2012-07-29 DIAGNOSIS — D649 Anemia, unspecified: Secondary | ICD-10-CM | POA: Diagnosis not present

## 2012-07-30 DIAGNOSIS — G20 Parkinson's disease: Secondary | ICD-10-CM | POA: Diagnosis not present

## 2012-07-30 DIAGNOSIS — Z96649 Presence of unspecified artificial hip joint: Secondary | ICD-10-CM | POA: Diagnosis not present

## 2012-07-30 DIAGNOSIS — Z471 Aftercare following joint replacement surgery: Secondary | ICD-10-CM | POA: Diagnosis not present

## 2012-07-30 DIAGNOSIS — IMO0001 Reserved for inherently not codable concepts without codable children: Secondary | ICD-10-CM | POA: Diagnosis not present

## 2012-07-30 DIAGNOSIS — D649 Anemia, unspecified: Secondary | ICD-10-CM | POA: Diagnosis not present

## 2012-07-31 DIAGNOSIS — Z471 Aftercare following joint replacement surgery: Secondary | ICD-10-CM | POA: Diagnosis not present

## 2012-07-31 DIAGNOSIS — G20 Parkinson's disease: Secondary | ICD-10-CM | POA: Diagnosis not present

## 2012-07-31 DIAGNOSIS — D649 Anemia, unspecified: Secondary | ICD-10-CM | POA: Diagnosis not present

## 2012-07-31 DIAGNOSIS — Z96649 Presence of unspecified artificial hip joint: Secondary | ICD-10-CM | POA: Diagnosis not present

## 2012-07-31 DIAGNOSIS — IMO0001 Reserved for inherently not codable concepts without codable children: Secondary | ICD-10-CM | POA: Diagnosis not present

## 2012-08-01 DIAGNOSIS — Z96649 Presence of unspecified artificial hip joint: Secondary | ICD-10-CM | POA: Diagnosis not present

## 2012-08-01 DIAGNOSIS — G20 Parkinson's disease: Secondary | ICD-10-CM | POA: Diagnosis not present

## 2012-08-01 DIAGNOSIS — D649 Anemia, unspecified: Secondary | ICD-10-CM | POA: Diagnosis not present

## 2012-08-01 DIAGNOSIS — Z471 Aftercare following joint replacement surgery: Secondary | ICD-10-CM | POA: Diagnosis not present

## 2012-08-01 DIAGNOSIS — IMO0001 Reserved for inherently not codable concepts without codable children: Secondary | ICD-10-CM | POA: Diagnosis not present

## 2012-08-05 ENCOUNTER — Ambulatory Visit (INDEPENDENT_AMBULATORY_CARE_PROVIDER_SITE_OTHER): Payer: Medicare Other | Admitting: Family Medicine

## 2012-08-05 ENCOUNTER — Encounter: Payer: Self-pay | Admitting: Family Medicine

## 2012-08-05 VITALS — BP 178/80 | HR 76 | Temp 97.8°F | Wt 133.0 lb

## 2012-08-05 DIAGNOSIS — S62306S Unspecified fracture of fifth metacarpal bone, right hand, sequela: Secondary | ICD-10-CM

## 2012-08-05 DIAGNOSIS — Z471 Aftercare following joint replacement surgery: Secondary | ICD-10-CM | POA: Diagnosis not present

## 2012-08-05 DIAGNOSIS — S72009S Fracture of unspecified part of neck of unspecified femur, sequela: Secondary | ICD-10-CM | POA: Diagnosis not present

## 2012-08-05 DIAGNOSIS — G20 Parkinson's disease: Secondary | ICD-10-CM | POA: Diagnosis not present

## 2012-08-05 DIAGNOSIS — S42309S Unspecified fracture of shaft of humerus, unspecified arm, sequela: Secondary | ICD-10-CM | POA: Diagnosis not present

## 2012-08-05 DIAGNOSIS — I10 Essential (primary) hypertension: Secondary | ICD-10-CM | POA: Diagnosis not present

## 2012-08-05 DIAGNOSIS — Z96649 Presence of unspecified artificial hip joint: Secondary | ICD-10-CM | POA: Diagnosis not present

## 2012-08-05 DIAGNOSIS — IMO0001 Reserved for inherently not codable concepts without codable children: Secondary | ICD-10-CM | POA: Diagnosis not present

## 2012-08-05 DIAGNOSIS — S72001S Fracture of unspecified part of neck of right femur, sequela: Secondary | ICD-10-CM

## 2012-08-05 DIAGNOSIS — D649 Anemia, unspecified: Secondary | ICD-10-CM | POA: Diagnosis not present

## 2012-08-05 DIAGNOSIS — G20A1 Parkinson's disease without dyskinesia, without mention of fluctuations: Secondary | ICD-10-CM

## 2012-08-05 LAB — CBC WITH DIFFERENTIAL/PLATELET
Eosinophils Relative: 2.9 % (ref 0.0–5.0)
Lymphocytes Relative: 34.7 % (ref 12.0–46.0)
Monocytes Absolute: 0.4 10*3/uL (ref 0.1–1.0)
Monocytes Relative: 5.9 % (ref 3.0–12.0)
Neutrophils Relative %: 55.8 % (ref 43.0–77.0)
Platelets: 171 10*3/uL (ref 150.0–400.0)
WBC: 6.4 10*3/uL (ref 4.5–10.5)

## 2012-08-05 LAB — COMPREHENSIVE METABOLIC PANEL
Albumin: 3.5 g/dL (ref 3.5–5.2)
BUN: 17 mg/dL (ref 6–23)
CO2: 29 mEq/L (ref 19–32)
GFR: 74.61 mL/min (ref >60.00)
Glucose, Bld: 81 mg/dL (ref 70–99)
Potassium: 3.8 mEq/L (ref 3.5–5.1)
Sodium: 139 mEq/L (ref 135–145)
Total Protein: 6.5 g/dL (ref 6.0–8.3)

## 2012-08-05 MED ORDER — AMITRIPTYLINE HCL 10 MG PO TABS: 10.0000 mg | ORAL_TABLET | Freq: Every day | ORAL | Status: DC

## 2012-08-05 MED ORDER — ATENOLOL 50 MG PO TABS: 25.0000 mg | ORAL_TABLET | Freq: Every evening | ORAL | Status: DC

## 2012-08-05 MED ORDER — AMANTADINE HCL 100 MG PO TABS: ORAL_TABLET | ORAL | Status: DC

## 2012-08-05 NOTE — Patient Instructions (Addendum)
Please take an extra tablet of your Atenolol when you get home.   Please call with your blood if it remains elevated. Restart your lasix.

## 2012-08-05 NOTE — Progress Notes (Signed)
79 yo with h/o severe depression and PD here for SNF follow up.  Was discharged from [facility] on 04/22/2012 after she was admitted s/p fall and found to have hip and hand fracture (metacarpal).  Notes reviewed. S/p bipolar hip arthroplasty. Sent to [facility] for rehab.  04/19/2012 *RADIOLOGY REPORT* Clinical Data: Preoperative evaluation, hip fracture CHEST - 1 VIEW Comparison: None. Findings: Atherosclerotic and tortuous thoracic aorta. The cardiac size is within normal limits. Prominence of the right paratracheal stripe likely reflects underlying vascular structures. There is mild central vascular congestion without overt edema. No pneumothorax, pleural effusion or focal airspace consolidation. The bones diffusely osteopenic. Mild dextroconvex scoliosis of the thoracic spine. Incompletely imaged posterior lumbar fixation hardware. IMPRESSION: 1. No acute cardiopulmonary disease. 2. Aortic atherosclerosis. 3. Mild pulmonary vascular congestion without edema. Original Report Authenticated By: Malachy Moan, M.D.  Dg Hip Complete Right  04/19/2012 *RADIOLOGY REPORT* Clinical Data: Post fall, now with right hip pain RIGHT HIP - COMPLETE 2+ VIEW Comparison: None. Findings: There is a minimally displaced fracture of the right femoral neck with foreshortening and angulation. No definite pelvic fracture. Limited visualization of the contralateral left hip is normal. Post surgical change of the lower lumbar spine, incompletely evaluated. Multiple phleboliths overlie the lower pelvis. No radiopaque foreign body. IMPRESSION: Minimally displaced fracture of the right femoral neck with foreshortening and angulation. Original Report Authenticated By: Tacey Ruiz, MD  Dg Pelvis Portable  04/19/2012 *RADIOLOGY REPORT* Clinical Data: Postop from right hip arthroplasty. PORTABLE PELVIS Comparison: None. Findings: Right hip prosthesis is seen in the expected position. No evidence of fracture or dislocation. Generalized  osteopenia noted. Skin staples seen overlying the right hip. IMPRESSION: Expected postoperative appearance of right hip prosthesis. No evidence of fracture or dislocation. Original Report Authenticated By: Myles Rosenthal, M.D.  Dg Hand Complete Right  04/19/2012 *RADIOLOGY REPORT* Clinical Data: Post fall, now with hand pain, especially the middle finger RIGHT HAND - COMPLETE 3+ VIEW Comparison: None. Findings: Diffuse osteopenia. There is mild windswept deformity of all the MCP joints with varus angulation. There is a minimally displaced fracture of the distal metaphysis of the fifth metacarpal with angulation. Evaluation of the adjacent fifth MCP joint is degraded secondary to obliquity. No definite fracture of the middle finger though evaluation degraded secondary to obliquity. Mild diffuse soft tissue swelling especially about the ulnar aspect of the hand. No radiopaque foreign body. IMPRESSION: 1. Minimally displaced fracture of the distal metaphysis of the fifth metacarpal. Evaluation of the adjacent MCP joint is degraded secondary to obliquity. 2. No definite fracture of the middle digit though note, evaluation degraded secondary to obliquity. Further evaluation with dedicated radiographs of this digit may be performed as clinically indicated. Original Report Authenticated By: Tacey Ruiz, MD    Lab Results  Component Value Date   CREATININE 0.79 04/21/2012    HTN- BP very elevated today.  Has not taken any medication today because she thought we might want to do blood work today.  PT/OT and home health nurse have been coming to house several times per week and per husband, BP was 120/80.  Patient Active Problem List   Diagnosis Date Noted  . Fracture of femoral neck, right 04/19/2012  . Fracture of fifth metacarpal bone of right hand 04/19/2012  . Leg edema 01/03/2012  . Poor circulation of extremity 08/01/2011  . HEARING LOSS, BILATERAL 04/11/2010  . UNSPECIFIED ANEMIA 02/23/2010  . DEPRESSION  10/01/2008  . PARKINSON'S DISEASE 10/01/2008  . HYPERTENSION 10/01/2008  .  MYOCARDIAL INFARCTION, HX OF 10/01/2008  . INSOMNIA 08/05/2007  . HYPOKALEMIA 04/03/2007   Past Medical History  Diagnosis Date  . Depression     s/p ECT therapy  . Hypertension   . Myocardial infarction 1987  . Parkinson's disease   . Fall at home    Past Surgical History  Procedure Laterality Date  . Back surgery  2003 & 01/1989  . Abdominal hysterectomy  1995  . Bladder surgery  09/1988  . Electroconvulsive therapy    . Hip arthroplasty  04/19/2012    Procedure: ARTHROPLASTY BIPOLAR HIP;  Surgeon: Dannielle Huh, MD;  Location[facility];  Service: Orthopedics;  Laterality: Right;   History  Substance Use Topics  . Smoking status: Never Smoker   . Smokeless tobacco: Never Used  . Alcohol Use: No   Family History  Problem Relation Age of Onset  . Angina Mother     and MI  . Alcohol abuse Other     brain tumor  . Diabetes Other    No Known Allergies Current Outpatient Prescriptions on File Prior to Visit  Medication Sig Dispense Refill  . Amantadine HCl 100 MG tablet Take 100 mg po q am and 1/2 tab = 50 mg  q evening.  45 tablet  0  . atenolol (TENORMIN) 50 MG tablet Take 0.5 tablets (25 mg total) by mouth every evening.  30 tablet  0  . furosemide (LASIX) 20 MG tablet Take 20mg  po qd. May have one 20mg  tab qd prn for weight gain > 2lbs or increased lower extremity edema.  60 tablet  0  . MELATONIN PO Take 1 tablet by mouth at bedtime.      . methocarbamol (ROBAXIN) 500 MG tablet Take 1 tablet (500 mg total) by mouth every 6 (six) hours as needed.  20 tablet  0  . potassium chloride (K-DUR) 10 MEQ tablet Take 1 tablet (10 mEq total) by mouth daily.  30 tablet  0   No current facility-administered medications on file prior to visit.   The PMH, PSH, Social History, Family History, Medications, and allergies have been reviewed in [facility], and have been updated if relevant.  See HPI  Pos for  insomnia Physical Exam  BP 178/80  Pulse 76  Temp(Src) 97.8 F (36.6 C)  Wt 133 lb (60.328 kg)  BMI 25.97 kg/m2   BP Readings from Last 3 Encounters:  04/22/12 119/80  04/22/12 119/80  01/03/12 190/102     General: alert, well-developed, well-nourished, and well-hydrated. NAD sitting quietly in wheel chair, in good spirits today.  Head: normocephalic, atraumatic, and no abnormalities observed.  Eyes: pupils equal, pupils round, and no injection.  Ears: R ear normal and L ear normal.  Nose: no external deformity.  Mouth: no gingival abnormalities.  Lungs: normal respiratory effort, no intercostal retractions, no accessory muscle use, and normal breath sounds.  Heart: occasional extra beat--no pattern noted, no murmur heard  Abdomen: soft, non-tender, normal bowel sounds, and no guarding.  Msk: able to raise both arms above her head slowly  Neurologic: alert & oriented X3,-tremors in both hands at rest, less so with intention  Ext:  Purplish discoloration of feet bilaterally with some spider veins and prominent veins, pos pedal pulses (dimished but equal bilaterally/baseline) 1+ pitting edema bilaterally Psych: normally interactive, flat affect, and subdued.    Assessment and Plan:  1. Fracture of fifth metacarpal bone of right hand, sequela Pain resolved.  2. Fracture of femoral neck, right, sequela S/p  replacement.  Pain controlled.  3. PARKINSON'S DISEASE Restarted amantidine.  4. HYPERTENSION Deteriorated but asymptomatic.  Advised to take medication as soon as she gets home, check BP and call me. - Comprehensive metabolic panel - CBC with Differential

## 2012-08-06 DIAGNOSIS — Z471 Aftercare following joint replacement surgery: Secondary | ICD-10-CM | POA: Diagnosis not present

## 2012-08-06 DIAGNOSIS — IMO0001 Reserved for inherently not codable concepts without codable children: Secondary | ICD-10-CM | POA: Diagnosis not present

## 2012-08-06 DIAGNOSIS — D649 Anemia, unspecified: Secondary | ICD-10-CM | POA: Diagnosis not present

## 2012-08-06 DIAGNOSIS — Z96649 Presence of unspecified artificial hip joint: Secondary | ICD-10-CM | POA: Diagnosis not present

## 2012-08-06 DIAGNOSIS — G20 Parkinson's disease: Secondary | ICD-10-CM | POA: Diagnosis not present

## 2012-08-07 DIAGNOSIS — Z96649 Presence of unspecified artificial hip joint: Secondary | ICD-10-CM | POA: Diagnosis not present

## 2012-08-07 DIAGNOSIS — G20 Parkinson's disease: Secondary | ICD-10-CM | POA: Diagnosis not present

## 2012-08-07 DIAGNOSIS — IMO0001 Reserved for inherently not codable concepts without codable children: Secondary | ICD-10-CM | POA: Diagnosis not present

## 2012-08-07 DIAGNOSIS — D649 Anemia, unspecified: Secondary | ICD-10-CM | POA: Diagnosis not present

## 2012-08-07 DIAGNOSIS — Z471 Aftercare following joint replacement surgery: Secondary | ICD-10-CM | POA: Diagnosis not present

## 2012-08-12 DIAGNOSIS — IMO0001 Reserved for inherently not codable concepts without codable children: Secondary | ICD-10-CM | POA: Diagnosis not present

## 2012-08-12 DIAGNOSIS — Z96649 Presence of unspecified artificial hip joint: Secondary | ICD-10-CM | POA: Diagnosis not present

## 2012-08-12 DIAGNOSIS — D649 Anemia, unspecified: Secondary | ICD-10-CM | POA: Diagnosis not present

## 2012-08-12 DIAGNOSIS — G20 Parkinson's disease: Secondary | ICD-10-CM | POA: Diagnosis not present

## 2012-08-12 DIAGNOSIS — Z471 Aftercare following joint replacement surgery: Secondary | ICD-10-CM | POA: Diagnosis not present

## 2012-08-13 DIAGNOSIS — IMO0001 Reserved for inherently not codable concepts without codable children: Secondary | ICD-10-CM | POA: Diagnosis not present

## 2012-08-13 DIAGNOSIS — Z96649 Presence of unspecified artificial hip joint: Secondary | ICD-10-CM | POA: Diagnosis not present

## 2012-08-13 DIAGNOSIS — D649 Anemia, unspecified: Secondary | ICD-10-CM | POA: Diagnosis not present

## 2012-08-13 DIAGNOSIS — G20 Parkinson's disease: Secondary | ICD-10-CM | POA: Diagnosis not present

## 2012-08-13 DIAGNOSIS — Z471 Aftercare following joint replacement surgery: Secondary | ICD-10-CM | POA: Diagnosis not present

## 2012-08-14 DIAGNOSIS — G20 Parkinson's disease: Secondary | ICD-10-CM | POA: Diagnosis not present

## 2012-08-14 DIAGNOSIS — D649 Anemia, unspecified: Secondary | ICD-10-CM | POA: Diagnosis not present

## 2012-08-14 DIAGNOSIS — Z96649 Presence of unspecified artificial hip joint: Secondary | ICD-10-CM | POA: Diagnosis not present

## 2012-08-14 DIAGNOSIS — IMO0001 Reserved for inherently not codable concepts without codable children: Secondary | ICD-10-CM | POA: Diagnosis not present

## 2012-08-14 DIAGNOSIS — Z471 Aftercare following joint replacement surgery: Secondary | ICD-10-CM | POA: Diagnosis not present

## 2012-08-15 DIAGNOSIS — Z96649 Presence of unspecified artificial hip joint: Secondary | ICD-10-CM | POA: Diagnosis not present

## 2012-08-15 DIAGNOSIS — G20 Parkinson's disease: Secondary | ICD-10-CM | POA: Diagnosis not present

## 2012-08-15 DIAGNOSIS — Z471 Aftercare following joint replacement surgery: Secondary | ICD-10-CM | POA: Diagnosis not present

## 2012-08-15 DIAGNOSIS — IMO0001 Reserved for inherently not codable concepts without codable children: Secondary | ICD-10-CM | POA: Diagnosis not present

## 2012-08-15 DIAGNOSIS — D649 Anemia, unspecified: Secondary | ICD-10-CM | POA: Diagnosis not present

## 2012-08-19 DIAGNOSIS — Z96649 Presence of unspecified artificial hip joint: Secondary | ICD-10-CM | POA: Diagnosis not present

## 2012-08-19 DIAGNOSIS — Z471 Aftercare following joint replacement surgery: Secondary | ICD-10-CM | POA: Diagnosis not present

## 2012-08-19 DIAGNOSIS — IMO0001 Reserved for inherently not codable concepts without codable children: Secondary | ICD-10-CM | POA: Diagnosis not present

## 2012-08-19 DIAGNOSIS — D649 Anemia, unspecified: Secondary | ICD-10-CM | POA: Diagnosis not present

## 2012-08-19 DIAGNOSIS — G20 Parkinson's disease: Secondary | ICD-10-CM | POA: Diagnosis not present

## 2012-08-20 ENCOUNTER — Other Ambulatory Visit: Payer: Self-pay | Admitting: Family Medicine

## 2012-08-20 DIAGNOSIS — Z471 Aftercare following joint replacement surgery: Secondary | ICD-10-CM | POA: Diagnosis not present

## 2012-08-20 DIAGNOSIS — IMO0001 Reserved for inherently not codable concepts without codable children: Secondary | ICD-10-CM | POA: Diagnosis not present

## 2012-08-20 DIAGNOSIS — G20 Parkinson's disease: Secondary | ICD-10-CM | POA: Diagnosis not present

## 2012-08-20 DIAGNOSIS — D649 Anemia, unspecified: Secondary | ICD-10-CM | POA: Diagnosis not present

## 2012-08-20 DIAGNOSIS — Z96649 Presence of unspecified artificial hip joint: Secondary | ICD-10-CM | POA: Diagnosis not present

## 2012-08-21 DIAGNOSIS — D649 Anemia, unspecified: Secondary | ICD-10-CM | POA: Diagnosis not present

## 2012-08-21 DIAGNOSIS — Z96649 Presence of unspecified artificial hip joint: Secondary | ICD-10-CM | POA: Diagnosis not present

## 2012-08-21 DIAGNOSIS — Z471 Aftercare following joint replacement surgery: Secondary | ICD-10-CM | POA: Diagnosis not present

## 2012-08-21 DIAGNOSIS — G20 Parkinson's disease: Secondary | ICD-10-CM | POA: Diagnosis not present

## 2012-08-21 DIAGNOSIS — IMO0001 Reserved for inherently not codable concepts without codable children: Secondary | ICD-10-CM | POA: Diagnosis not present

## 2012-08-23 DIAGNOSIS — Z471 Aftercare following joint replacement surgery: Secondary | ICD-10-CM | POA: Diagnosis not present

## 2012-08-23 DIAGNOSIS — IMO0001 Reserved for inherently not codable concepts without codable children: Secondary | ICD-10-CM | POA: Diagnosis not present

## 2012-08-23 DIAGNOSIS — G20 Parkinson's disease: Secondary | ICD-10-CM | POA: Diagnosis not present

## 2012-08-23 DIAGNOSIS — D649 Anemia, unspecified: Secondary | ICD-10-CM | POA: Diagnosis not present

## 2012-08-23 DIAGNOSIS — Z96649 Presence of unspecified artificial hip joint: Secondary | ICD-10-CM | POA: Diagnosis not present

## 2012-08-26 DIAGNOSIS — IMO0001 Reserved for inherently not codable concepts without codable children: Secondary | ICD-10-CM | POA: Diagnosis not present

## 2012-08-26 DIAGNOSIS — Z471 Aftercare following joint replacement surgery: Secondary | ICD-10-CM | POA: Diagnosis not present

## 2012-08-26 DIAGNOSIS — D649 Anemia, unspecified: Secondary | ICD-10-CM | POA: Diagnosis not present

## 2012-08-26 DIAGNOSIS — G20 Parkinson's disease: Secondary | ICD-10-CM | POA: Diagnosis not present

## 2012-08-26 DIAGNOSIS — Z96649 Presence of unspecified artificial hip joint: Secondary | ICD-10-CM | POA: Diagnosis not present

## 2012-08-27 DIAGNOSIS — Z471 Aftercare following joint replacement surgery: Secondary | ICD-10-CM | POA: Diagnosis not present

## 2012-08-27 DIAGNOSIS — G20 Parkinson's disease: Secondary | ICD-10-CM | POA: Diagnosis not present

## 2012-08-27 DIAGNOSIS — D649 Anemia, unspecified: Secondary | ICD-10-CM | POA: Diagnosis not present

## 2012-08-27 DIAGNOSIS — Z96649 Presence of unspecified artificial hip joint: Secondary | ICD-10-CM | POA: Diagnosis not present

## 2012-08-27 DIAGNOSIS — IMO0001 Reserved for inherently not codable concepts without codable children: Secondary | ICD-10-CM | POA: Diagnosis not present

## 2012-08-28 DIAGNOSIS — D649 Anemia, unspecified: Secondary | ICD-10-CM | POA: Diagnosis not present

## 2012-08-28 DIAGNOSIS — IMO0001 Reserved for inherently not codable concepts without codable children: Secondary | ICD-10-CM | POA: Diagnosis not present

## 2012-08-28 DIAGNOSIS — G20 Parkinson's disease: Secondary | ICD-10-CM | POA: Diagnosis not present

## 2012-08-28 DIAGNOSIS — Z471 Aftercare following joint replacement surgery: Secondary | ICD-10-CM | POA: Diagnosis not present

## 2012-08-28 DIAGNOSIS — Z96649 Presence of unspecified artificial hip joint: Secondary | ICD-10-CM | POA: Diagnosis not present

## 2012-09-03 ENCOUNTER — Other Ambulatory Visit: Payer: Self-pay | Admitting: Family Medicine

## 2012-09-16 ENCOUNTER — Other Ambulatory Visit: Payer: Self-pay | Admitting: Family Medicine

## 2012-09-23 ENCOUNTER — Other Ambulatory Visit: Payer: Self-pay | Admitting: Family Medicine

## 2012-10-08 ENCOUNTER — Telehealth: Payer: Self-pay

## 2012-10-08 NOTE — Telephone Encounter (Signed)
Pt called to ask if medicare will pay for home sitter; advised pt to contact medicare for guideline of what they will cover and how much they would pay if they do cover service. Pt voiced understanding.

## 2012-10-16 ENCOUNTER — Other Ambulatory Visit: Payer: Self-pay | Admitting: Family Medicine

## 2012-11-11 NOTE — Progress Notes (Signed)
This encounter was created in error - please disregard.

## 2012-11-12 ENCOUNTER — Ambulatory Visit (INDEPENDENT_AMBULATORY_CARE_PROVIDER_SITE_OTHER): Payer: Medicare Other | Admitting: Family Medicine

## 2012-11-12 ENCOUNTER — Encounter: Payer: Self-pay | Admitting: Family Medicine

## 2012-11-12 VITALS — BP 150/80 | HR 92 | Temp 98.0°F | Wt 139.0 lb

## 2012-11-12 DIAGNOSIS — F329 Major depressive disorder, single episode, unspecified: Secondary | ICD-10-CM | POA: Diagnosis not present

## 2012-11-12 DIAGNOSIS — R609 Edema, unspecified: Secondary | ICD-10-CM | POA: Diagnosis not present

## 2012-11-12 DIAGNOSIS — I10 Essential (primary) hypertension: Secondary | ICD-10-CM | POA: Diagnosis not present

## 2012-11-12 DIAGNOSIS — E876 Hypokalemia: Secondary | ICD-10-CM

## 2012-11-12 DIAGNOSIS — G20 Parkinson's disease: Secondary | ICD-10-CM | POA: Diagnosis not present

## 2012-11-12 NOTE — Progress Notes (Signed)
79 yo with h/o severe depression and PD here with her husband to "discuss medications."    Last saw her after she was discharged from SNF this spring after fall and subsequent hip and wrist fx.  Lab Results  Component Value Date   CREATININE 0.8 08/05/2012    Edema- has been taking lasix 20 mg daily with Kdur 10 meq daily.  She has noticed that she is more weak at the end of the day. Lab Results  Component Value Date   K 3.8 08/05/2012     PD- has not seen Dr. Arbutus Leas since 12/2011.  She wants to adjust her amantadine dose.  She feels it is wearing off at the end of the day and would like for me to take over the PD management.   Patient Active Problem List   Diagnosis Date Noted  . Fracture of femoral neck, right 04/19/2012  . Fracture of fifth metacarpal bone of right hand 04/19/2012  . Leg edema 01/03/2012  . Poor circulation of extremity 08/01/2011  . HEARING LOSS, BILATERAL 04/11/2010  . UNSPECIFIED ANEMIA 02/23/2010  . DEPRESSION 10/01/2008  . PARKINSON'S DISEASE 10/01/2008  . HYPERTENSION 10/01/2008  . MYOCARDIAL INFARCTION, HX OF 10/01/2008  . INSOMNIA 08/05/2007  . HYPOKALEMIA 04/03/2007   Past Medical History  Diagnosis Date  . Depression     s/p ECT therapy  . Hypertension   . Myocardial infarction 1987  . Parkinson's disease   . Fall at home    Past Surgical History  Procedure Laterality Date  . Back surgery  2003 & 01/1989  . Abdominal hysterectomy  1995  . Bladder surgery  09/1988  . Electroconvulsive therapy    . Hip arthroplasty  04/19/2012    Procedure: ARTHROPLASTY BIPOLAR HIP;  Surgeon: Dannielle Huh, MD;  Location: [facility] OR;  Service: Orthopedics;  Laterality: Right;   History  Substance Use Topics  . Smoking status: Never Smoker   . Smokeless tobacco: Never Used  . Alcohol Use: No   Family History  Problem Relation Age of Onset  . Angina Mother     and MI  . Alcohol abuse Other     brain tumor  . Diabetes Other    No Known Allergies Current  Outpatient Prescriptions on File Prior to Visit  Medication Sig Dispense Refill  . Amantadine HCl 100 MG tablet TAKE 1 CAPSULES BY MOUTH EVERY MORNING AND 1/2 IN THE EVENING  45 tablet  0  . amitriptyline (ELAVIL) 10 MG tablet Take 1 tablet (10 mg total) by mouth at bedtime.  30 tablet  11  . atenolol (TENORMIN) 50 MG tablet Take 0.5 tablets (25 mg total) by mouth every evening.  30 tablet  11  . furosemide (LASIX) 20 MG tablet TAKE 1 TAB DAILY, IF NEEDED1 TAB IF WEIGHT GAIN GREATER THAN 2 LBS OR INCREASED LOWER EXREMITY EDEMA  60 tablet  1  . potassium chloride (K-DUR) 10 MEQ tablet TAKE 1 TABLET BY MOUTH EVERY DAY  30 tablet  3  . Sennosides (SENOKOT PO) Take one by mouth daily       No current facility-administered medications on file prior to visit.   The PMH, PSH, Social History, Family History, Medications, and allergies have been reviewed in [facility], and have been updated if relevant.  See HPI  Pos for insomnia Physical Exam  BP 150/80  Pulse 92  Temp(Src) 98 F (36.7 C)  Wt 139 lb (63.05 kg)  BMI 27.15 kg/m2   BP Readings from  Last 3 Encounters:  08/05/12 178/80  04/22/12 119/80  04/22/12 119/80     General: alert, well-developed, well-nourished, and well-hydrated. NAD sitting quietly in wheel chair, in good spirits today.  Head: normocephalic, atraumatic, and no abnormalities observed.  Eyes: pupils equal, pupils round, and no injection.  Ears: R ear normal and L ear normal.  Nose: no external deformity.  Mouth: no gingival abnormalities.  Lungs: normal respiratory effort, no intercostal retractions, no accessory muscle use, and normal breath sounds.  Heart: occasional extra beat--no pattern noted, no murmur heard  Abdomen: soft, non-tender, normal bowel sounds, and no guarding.  Msk: able to raise both arms above her head slowly  Neurologic: alert & oriented X3,-tremors in both hands at rest Ext: No edema today Psych: normally interactive, flat affect, and subdued.     Assessment and Plan:  1. PARKINSON'S DISEASE >25 min spent with face to face with patient, >50% counseling and/or coordinating care She is very argumentative with me today.  I explained to her that she needs to discuss changes in her medications with Dr. Arbutus Leas, her neurologist, but she is insisting that I do this.  I explained that is outside my scope of practice but I would contact Dr. Arbutus Leas for her.  - Ambulatory referral to Neurology  2. HYPOKALEMIA Recheck lytes today. - Comprehensive metabolic panel  5. Edema Improved with lasix.  Recheck kidney function and electrolytes today. - Comprehensive metabolic panel

## 2012-11-12 NOTE — Patient Instructions (Addendum)
Good to see you. We will call you with your lab results.  I have asked Dr. Arbutus Leas to contact you concerning your amantadine.

## 2012-11-13 LAB — COMPREHENSIVE METABOLIC PANEL
Albumin: 3.9 g/dL (ref 3.5–5.2)
Alkaline Phosphatase: 74 U/L (ref 39–117)
CO2: 29 mEq/L (ref 19–32)
Calcium: 8.9 mg/dL (ref 8.4–10.5)
Chloride: 106 mEq/L (ref 96–112)
Glucose, Bld: 107 mg/dL — ABNORMAL HIGH (ref 70–99)
Potassium: 4.2 mEq/L (ref 3.5–5.1)
Sodium: 140 mEq/L (ref 135–145)
Total Protein: 6.5 g/dL (ref 6.0–8.3)

## 2012-11-18 ENCOUNTER — Encounter: Payer: Self-pay | Admitting: Neurology

## 2012-11-18 ENCOUNTER — Ambulatory Visit (INDEPENDENT_AMBULATORY_CARE_PROVIDER_SITE_OTHER): Payer: Medicare Other | Admitting: Neurology

## 2012-11-18 VITALS — BP 116/84 | HR 84 | Resp 16 | Ht 64.0 in | Wt 133.0 lb

## 2012-11-18 DIAGNOSIS — G20 Parkinson's disease: Secondary | ICD-10-CM | POA: Diagnosis not present

## 2012-11-18 DIAGNOSIS — K5900 Constipation, unspecified: Secondary | ICD-10-CM | POA: Diagnosis not present

## 2012-11-18 MED ORDER — AMANTADINE HCL 100 MG PO CAPS: 100.0000 mg | ORAL_CAPSULE | Freq: Two times a day (BID) | ORAL | Status: DC

## 2012-11-18 NOTE — Progress Notes (Signed)
Mallory Perez was seen today in the movement disorders clinic for neurologic consultation at the request of [name], MD and Dr. [name].  Since [name] is no longer with the practice, I will be resuming the patient's care.  I had the opportunity to review Dr. Nash Dimmer notes.  The patient has also previously seen  Dr [name] with [name] and Dr. [name] at [facility] for the same problem.  She reports that she has seen 6 different neurologists.   She was discharged from [facility] for noncompliance.The consultation is for the evaluation of Parkinson's disease.  The patient is accompanied by her husband who supplements the history.  The pt reports that in 1999 she received celebrex for few days and not long thereafter, she began to tremor.  She thought that it was related to the celebrex but she was later given the dx of "parkinsonism."  The first symptom was tremor in the R hand but it can involve either hand.    11/18/12 update:   The pt is f/u in regards to her PD.  She is with her husband who supplements the hx.  She has not been seen in almost a year.  I reviewed her notes from other physcians that were avail to me.  She has been on multiple medications for tremor and Parkinson's, but has had"intolerance" to all medications.  She has been on atenolol, Artane, levodopa (patient stated that it made her worse), Stalevo (jerking and insomnia) and Neupro (patient thought that it caused more freezing).  She has also been on Requip and Mirapex and was not able to tolerate these.  She was discharged from the care of [name] because [name] felt that she had nothing further to offer her.  She saw Dr. [name] at [facility] who recommended that the patient retry levodopa but the patient refused.  Last visit here, I discontinued her amantadine just for a short trial to see if that was causing the swelling.  Her primary care physician placed her on hydrochlorothiazide and we restarted the amantadine. Today, she states that she  did not restart the amantadine at bid/tid dosing (she is on 1 in AM and 1/2 q hs and would like to increase).  She asks if the veins in her legs are from amantadine. I also started the Exelon patch last visit but the patient also thought that that made her worse.  She did fall in February and fractured her right hip and wrist.  She had surgical intervention for the right hip.  She went to subacute rehab.  She did not think that it was helpful.  She has very vivid dreams but is not having hallucinations.  She is very consipated and her husband notes that when she takes the Senakot that was prescribed, it seems to affect her physically and she gets weak.  PRIOR MED TRIALS:   atenolol, Artane, levodopa (patient stated that it made her worse), Stalevo (jerking and insomnia) and Neupro (patient thought that it caused more freezing).  She has also been on Requip and Mirapex and was not able to tolerate these.    She saw Dr. [name] at [name] who recommended that the patient retry levodopa but the patient refused.   Exelon patch was also trialed but pt thought that it made her worse.  Believe seroquel was tried as well.  ALLERGIES:  No Known Allergies  CURRENT MEDICATIONS:  Current Outpatient Prescriptions on File Prior to Visit  Medication Sig Dispense Refill  .  amitriptyline (ELAVIL) 10 MG tablet Take 1 tablet (10 mg total) by mouth at bedtime.  30 tablet  11  . atenolol (TENORMIN) 50 MG tablet Take 0.5 tablets (25 mg total) by mouth every evening.  30 tablet  11  . furosemide (LASIX) 20 MG tablet TAKE 1 TAB DAILY, IF NEEDED1 TAB IF WEIGHT GAIN GREATER THAN 2 LBS OR INCREASED LOWER EXREMITY EDEMA  60 tablet  1  . potassium chloride (K-DUR) 10 MEQ tablet TAKE 1 TABLET BY MOUTH EVERY DAY  30 tablet  3  . Sennosides (SENOKOT PO) Take one by mouth daily       No current facility-administered medications on file prior to visit.    PAST MEDICAL HISTORY:   Past Medical History  Diagnosis Date  . Depression      s/p ECT therapy  . Hypertension   . Myocardial infarction 1987  . Parkinson's disease   . Fall at home     PAST SURGICAL HISTORY:   Past Surgical History  Procedure Laterality Date  . Back surgery  2003 & 01/1989  . Abdominal hysterectomy  1995  . Bladder surgery  09/1988  . Electroconvulsive therapy    . Hip arthroplasty  04/19/2012    Procedure: ARTHROPLASTY BIPOLAR HIP;  Surgeon: Dannielle Huh, MD;  Location: [facility] OR;  Service: Orthopedics;  Laterality: Right;    SOCIAL HISTORY:   History   Social History  . Marital Status: Married    Spouse Name: N/A    Number of Children: N/A  . Years of Education: N/A   Occupational History  . Not on file.   Social History Main Topics  . Smoking status: Never Smoker   . Smokeless tobacco: Never Used  . Alcohol Use: No  . Drug Use: No  . Sexual Activity: Not on file   Other Topics Concern  . Not on file   Social History Narrative  . No narrative on file    FAMILY HISTORY:   Family Status  Relation Status Death Age  . Mother Deceased 13    MI  . Father Deceased 84    MI  . Brother Deceased     5, deceased, MI, DM, "brain tumor"  . Sister Deceased     4, MI  . Child Alive     4, one with CAD    ROS: e/o LE edema.   A complete 10 system review of systems was obtained and was unremarkable apart from what is mentioned above.  PHYSICAL EXAMINATION:    VITALS:   Filed Vitals:   11/18/12 0931 11/18/12 1016  BP: 160/102 116/84  Pulse: 84   Resp: 16   Height: 5\' 4"  (1.626 m)   Weight: 133 lb (60.328 kg)     BP recheck 116/84.  GEN:  The patient appears stated age and is in NAD. HEENT:  Normocephalic, atraumatic.  The mucous membranes are moist. The superficial temporal arteries are without ropiness or tenderness. CV:  RRR Lungs:  CTAB Neck/HEME:  There are no carotid bruits bilaterally. EXTS/SKIN:  There is no LE edema.  While there is venous stasis, there is no livedo riticularis.  Neurological  examination:  Orientation: The patient is alert and oriented x3. Fund of knowledge is appropriate.  Recent and remote memory are intact.  Attention and concentration are normal.    Able to name objects and repeat phrases. Cranial nerves: There is good facial symmetry but significant facial hypomimia with Stellwag's sign.  Pupils are equal  round and reactive to light bilaterally. Fundoscopic exam is attempted but the disc margins are not well visualized bilaterally. Extraocular muscles are intact. The visual fields are full to confrontational testing. The speech is fluent and clear. Soft palate rises symmetrically and there is no tongue deviation. Hearing is intact to conversational tone. Sensation: Sensation is intact to light and pinprick throughout (facial, trunk, extremities). Vibration is decreased at the bilateral ankle. There is no extinction with double simultaneous stimulation. There is no sensory dermatomal level identified. Motor: Strength is 5/5 in the bilateral upper and lower extremities.   Shoulder shrug is equal and symmetric.  There is no pronator drift. Deep tendon reflexes: Deep tendon reflexes are 0-1/4 at the bilateral biceps, triceps, brachioradialis, patella and achilles. Plantar responses are downgoing bilaterally.  Movement examination: Tone: There is significantly increased tone in the bilateral upper extremities.  The tone in the lower extremities is also increased.  Abnormal movements: There is a moderate resting tremor of the left upper and left lower extremity.  There is a mild resting tremor of the right upper extremity. Coordination:  There is significant decremation with RAM's, and there is a significant slowness with rapid alternating movements. Gait and Station: The patient is unable to arise out of the chair without assistance and requires max assist just to stand.  Ambulation was not attempted as she was unable without significant help.  ASSESSMENT/PLAN  1.   Parkinson's disease.   This is evidenced by significant rigidity, tremor, bradykinesia and postural instability.  I see no evidence that she has one of the Parkinson's plus syndromes as I see no unusual features of the disease (early falls , early urinary incontinence, early swallowing difficulties, apraxia of eyelid opening,etc).    -Like Dr. [name] and [name], I agree that the pt needs to retry levodopa even if we try 1/2 or 1/4 tablets.  She refuses.  She believes she has tried 1/2 tabs before and does not wish to retry.  -She will remain on the amantadine and asks if she can increase the dosage to twice a day dosing.  I have no objection to this.  She may go up to 3 times per day if she feels this is helpful.  Risks, benefits, side effects and alternative therapies were discussed.  The opportunity to ask questions was given and they were answered to the best of my ability.  The patient expressed understanding and willingness to follow the outlined treatment protocols.  -I. saw no evidence of livedo reticularis, as she asked if no skin discoloration was from amantadine.  What she showed me looks much more like chronic venous stasis.  I assured her that this was not from amantadine.  -I talked to her about Azilect, as this is one of the medications she has not tried.  She refused this medication.  She will let me know if she changes her mind. 2.  Constipation associated with PD  -She was given a copy of the Rancho recipe.   3.  I will see her back in 6 months, sooner should new issues arise.

## 2012-11-18 NOTE — Patient Instructions (Addendum)
Constipation and Parkinson's disease:  1.Rancho recipe for constipation in Parkinsons Disease:  -1 cup of bran, 2 cups of applesauce in 1 cup of prune juice 2.  Increase fiber intake (Metamucil,vegetables) 3.  Regular, moderate exercise can be beneficial. 4.  Avoid medications causing constipation, such as medications like antacids with calcium or magnesium 5.  Laxative overuse should be avoided. 6.  Stool softeners (Colace) can help with chronic constipation.  Follow up in six months

## 2012-11-19 ENCOUNTER — Other Ambulatory Visit: Payer: Self-pay | Admitting: *Deleted

## 2012-11-19 MED ORDER — POTASSIUM CHLORIDE CRYS ER 10 MEQ PO TBCR: 10.0000 meq | EXTENDED_RELEASE_TABLET | Freq: Every day | ORAL | Status: DC

## 2012-11-25 ENCOUNTER — Other Ambulatory Visit: Payer: Self-pay | Admitting: *Deleted

## 2012-11-25 MED ORDER — AMANTADINE HCL 100 MG PO CAPS: 100.0000 mg | ORAL_CAPSULE | Freq: Two times a day (BID) | ORAL | Status: DC

## 2012-12-27 ENCOUNTER — Other Ambulatory Visit: Payer: Self-pay | Admitting: Family Medicine

## 2013-05-19 ENCOUNTER — Ambulatory Visit: Payer: Medicare Other | Admitting: Neurology

## 2013-05-21 ENCOUNTER — Encounter: Payer: Self-pay | Admitting: Neurology

## 2013-05-21 ENCOUNTER — Ambulatory Visit (INDEPENDENT_AMBULATORY_CARE_PROVIDER_SITE_OTHER): Payer: Medicare Other | Admitting: Neurology

## 2013-05-21 VITALS — BP 138/98 | HR 68 | Resp 16 | Ht 64.0 in | Wt 140.0 lb

## 2013-05-21 DIAGNOSIS — G20 Parkinson's disease: Secondary | ICD-10-CM

## 2013-05-21 MED ORDER — RASAGILINE MESYLATE 0.5 MG PO TABS: 0.5000 mg | ORAL_TABLET | Freq: Every day | ORAL | Status: DC

## 2013-05-21 MED ORDER — TRANSPORT CHAIR MISC: 1.0000 | Status: DC | PRN

## 2013-05-21 NOTE — Progress Notes (Signed)
Mallory Perez was seen today in the movement disorders clinic for neurologic consultation at the request of [name], MD and Dr. [name].  Since [name] is no longer with the practice, I will be resuming the patient's care.  I had the opportunity to review Dr. Nash Dimmer notes.  The patient has also previously seen  Dr [name] with [name] and Dr. [name] at [facility] for the same problem.  She reports that she has seen 6 different neurologists.   She was discharged from [facility] for noncompliance.The consultation is for the evaluation of Parkinson's disease.  The patient is accompanied by her husband who supplements the history.  The pt reports that in 1999 she received celebrex for few days and not long thereafter, she began to tremor.  She thought that it was related to the celebrex but she was later given the dx of "parkinsonism."  The first symptom was tremor in the R hand but it can involve either hand.    11/18/12 update:   The pt is f/u in regards to her PD.  She is with her husband who supplements the hx.  She has not been seen in almost a year.  I reviewed her notes from other physcians that were avail to me.  She has been on multiple medications for tremor and Parkinson's, but has had"intolerance" to all medications.  She has been on atenolol, Artane, levodopa (patient stated that it made her worse), Stalevo (jerking and insomnia) and Neupro (patient thought that it caused more freezing).  She has also been on Requip and Mirapex and was not able to tolerate these.  She was discharged from the care of [name] because [name] felt that she had nothing further to offer her.  She saw Dr. [name] at [facility] who recommended that the patient retry levodopa but the patient refused.  Last visit here, I discontinued her amantadine just for a short trial to see if that was causing the swelling.  Her primary care physician placed her on hydrochlorothiazide and we restarted the amantadine. Today, she states that she  did not restart the amantadine at bid/tid dosing (she is on 1 in AM and 1/2 q hs and would like to increase).  She asks if the veins in her legs are from amantadine. I also started the Exelon patch last visit but the patient also thought that that made her worse.  She did fall in February and fractured her right hip and wrist.  She had surgical intervention for the right hip.  She went to subacute rehab.  She did not think that it was helpful.  She has very vivid dreams but is not having hallucinations.  She is very consipated and her husband notes that when she takes the Senakot that was prescribed, it seems to affect her physically and she gets weak.  05/21/13 update:  Pt is f/u in regards to her PD.  This patient is accompanied in the office by her spouse who supplements the history.  Pt is only on amantadine.  Thinks that it caused hallucinations and would like to come off of the medication.  Currently taking 100 mg - 1/2 pills tid.  Pt and husband express concerns that they don't think that she has PD.  State that "8 neurologists told me I don't have PD.''  States that all the sxs started after a surgery where she was given nausea medication and it caused the sx (previously told me it was celebrex).  Pt  had one fall since last visit - fell over a bench but just ended up sitting down on the bench.  No exercise and no therapy since last visit.  Was able to get out of chair at house but cannot do that now.  Husband doing much of the caregiving.  PRIOR MED TRIALS:   Amantadine (initially thought caused swelling; later thought caused hallucinations), atenolol, Artane, levodopa (patient stated that it made her worse), Stalevo (jerking and insomnia) and Neupro (patient thought that it caused more freezing).  She has also been on Requip and Mirapex and was not able to tolerate these.    [facility] who recommended that the patient retry levodopa but the patient refused.   Exelon patch was also trialed  but pt thought that it made her worse.  Believe seroquel was tried as well.  ALLERGIES:  No Known Allergies  CURRENT MEDICATIONS:  Current Outpatient Prescriptions on File Prior to Visit  Medication Sig Dispense Refill  . amitriptyline (ELAVIL) 10 MG tablet Take 1 tablet (10 mg total) by mouth at bedtime.  30 tablet  11  . atenolol (TENORMIN) 50 MG tablet Take 0.5 tablets (25 mg total) by mouth every evening.  30 tablet  11  . furosemide (LASIX) 20 MG tablet TAKE 1 TAB DAILY, IF NEEDED1 TAB IF WEIGHT GAIN GREATER THAN 2 LBS OR INCREASED LOWER EXREMITY EDEMA  60 tablet  5  . [DISCONTINUED] potassium chloride (K-DUR) 10 MEQ tablet TAKE 1 TABLET BY MOUTH EVERY DAY  30 tablet  3   No current facility-administered medications on file prior to visit.    PAST MEDICAL HISTORY:   Past Medical History  Diagnosis Date  . Depression     s/p ECT therapy  . Hypertension   . Myocardial infarction 1987  . Parkinson's disease   . Fall at home     PAST SURGICAL HISTORY:   Past Surgical History  Procedure Laterality Date  . Back surgery  2003 & 01/1989  . Abdominal hysterectomy  1995  . Bladder surgery  09/1988  . Electroconvulsive therapy    . Hip arthroplasty  04/19/2012    Procedure: ARTHROPLASTY BIPOLAR HIP;  Surgeon: Dannielle HuhSteve Lucey, MD;  [facility];  Service: Orthopedics;  Laterality: Right;    SOCIAL HISTORY:   History   Social History  . Marital Status: Married    Spouse Name: N/A    Number of Children: N/A  . Years of Education: N/A   Occupational History  . Not on file.   Social History Main Topics  . Smoking status: Never Smoker   . Smokeless tobacco: Never Used  . Alcohol Use: No  . Drug Use: No  . Sexual Activity: Not on file   Other Topics Concern  . Not on file   Social History Narrative  . No narrative on file    FAMILY HISTORY:   Family Status  Relation Status Death Age  . Mother Deceased 8389    MI  . Father Deceased 3986    MI  . Brother Deceased      5, deceased, MI, DM, "brain tumor"  . Sister Deceased     4, MI  . Child Alive     4, one with CAD    ROS: e/o LE edema.   A complete 10 system review of systems was obtained and was unremarkable apart from what is mentioned above.  PHYSICAL EXAMINATION:    VITALS:   Filed Vitals:  05/21/13 1057  BP: 138/98  Pulse: 68  Resp: 16  Height: 5\' 4"  (1.626 m)  Weight: 140 lb (63.504 kg)    GEN:  The patient appears stated age and is in NAD. EXTS/SKIN:  There is no LE edema.  While there is venous stasis, there is no livedo riticularis.  Neurological examination:  Orientation: The patient is alert and oriented x3. Fund of knowledge is appropriate.  Recent and remote memory are intact.  Attention and concentration are normal.    Able to name objects and repeat phrases. Cranial nerves: There is good facial symmetry but significant facial hypomimia with Stellwag's sign.  Pupils are equal round and reactive to light bilaterally. Fundoscopic exam is attempted but the disc margins are not well visualized bilaterally. Extraocular muscles are intact. The visual fields are full to confrontational testing. The speech is fluent and clear. Soft palate rises symmetrically and there is no tongue deviation. Hearing is intact to conversational tone. Sensation: Sensation is intact to light touch throughout. Motor: Strength is 5/5 in the bilateral upper and lower extremities.   Shoulder shrug is equal and symmetric.  There is no pronator drift.   Movement examination: Tone: There is increased tone in the bilateral upper extremities.  The tone in the lower extremities is also increased.  L foot is dystonic posture.   Abnormal movements: There is a moderate resting tremor of the left upper and left lower extremity.  There is a mild resting tremor of the right upper extremity.  There is head tremor. Coordination:  There is significant decremation with RAM's, and there is a significant slowness with rapid  alternating movements. Gait and Station: The patient does not wish to attempt ambulation (refuses) even with the walker and with people helping.   ASSESSMENT/PLAN  1.  Parkinson's disease.   This is evidenced by significant rigidity, tremor, bradykinesia and postural instability.  I see no evidence that she has one of the Parkinson's plus syndromes as I see no unusual features of the disease (early falls , early urinary incontinence, early swallowing difficulties, apraxia of eyelid opening,etc).    -Like Dr. [name] and [name], I agree that the pt needs to retry levodopa even if we try 1/2 or 1/4 tablets.  She refuses.  She believes she has tried 1/2 tabs before and does not wish to retry.  She doesn't want to try the CR preparation which can be easier to take.  Talked to her about rytary but doesn't think that she is interested.  -at patients request amantadine will be d/c.  Only on 1/2 tid.  -Pt does ask about azilect as we had talked about it last visit.  I gave samples of the 0.5 mg, one tablet daily.  She will call me back to let me know how she is doing before RX is written.  -Unfortunately, I believe that I don't have much to offer her.  Will discuss further with PCP to see whether new neurologist would be beneficial   -Pt agreeable to [facility] for PT.  Also they asked about SNF therapy and I have had good success with [facility] and gave them that name. 2.  Return in about 6 months (around 11/21/2013).

## 2013-05-25 DIAGNOSIS — I10 Essential (primary) hypertension: Secondary | ICD-10-CM | POA: Diagnosis not present

## 2013-05-25 DIAGNOSIS — Z5189 Encounter for other specified aftercare: Secondary | ICD-10-CM | POA: Diagnosis not present

## 2013-05-25 DIAGNOSIS — Z9181 History of falling: Secondary | ICD-10-CM | POA: Diagnosis not present

## 2013-05-25 DIAGNOSIS — F3289 Other specified depressive episodes: Secondary | ICD-10-CM | POA: Diagnosis not present

## 2013-05-25 DIAGNOSIS — G20 Parkinson's disease: Secondary | ICD-10-CM | POA: Diagnosis not present

## 2013-05-25 DIAGNOSIS — I252 Old myocardial infarction: Secondary | ICD-10-CM | POA: Diagnosis not present

## 2013-05-25 DIAGNOSIS — F329 Major depressive disorder, single episode, unspecified: Secondary | ICD-10-CM | POA: Diagnosis not present

## 2013-05-25 DIAGNOSIS — D649 Anemia, unspecified: Secondary | ICD-10-CM | POA: Diagnosis not present

## 2013-05-26 DIAGNOSIS — D649 Anemia, unspecified: Secondary | ICD-10-CM | POA: Diagnosis not present

## 2013-05-26 DIAGNOSIS — F3289 Other specified depressive episodes: Secondary | ICD-10-CM | POA: Diagnosis not present

## 2013-05-26 DIAGNOSIS — F329 Major depressive disorder, single episode, unspecified: Secondary | ICD-10-CM | POA: Diagnosis not present

## 2013-05-26 DIAGNOSIS — I252 Old myocardial infarction: Secondary | ICD-10-CM | POA: Diagnosis not present

## 2013-05-26 DIAGNOSIS — Z5189 Encounter for other specified aftercare: Secondary | ICD-10-CM | POA: Diagnosis not present

## 2013-05-26 DIAGNOSIS — I10 Essential (primary) hypertension: Secondary | ICD-10-CM | POA: Diagnosis not present

## 2013-05-26 DIAGNOSIS — G20 Parkinson's disease: Secondary | ICD-10-CM | POA: Diagnosis not present

## 2013-06-01 ENCOUNTER — Telehealth: Payer: Self-pay | Admitting: Neurology

## 2013-06-01 DIAGNOSIS — D649 Anemia, unspecified: Secondary | ICD-10-CM | POA: Diagnosis not present

## 2013-06-01 DIAGNOSIS — I10 Essential (primary) hypertension: Secondary | ICD-10-CM | POA: Diagnosis not present

## 2013-06-01 DIAGNOSIS — F3289 Other specified depressive episodes: Secondary | ICD-10-CM | POA: Diagnosis not present

## 2013-06-01 DIAGNOSIS — Z5189 Encounter for other specified aftercare: Secondary | ICD-10-CM | POA: Diagnosis not present

## 2013-06-01 DIAGNOSIS — G20 Parkinson's disease: Secondary | ICD-10-CM | POA: Diagnosis not present

## 2013-06-01 DIAGNOSIS — F329 Major depressive disorder, single episode, unspecified: Secondary | ICD-10-CM | POA: Diagnosis not present

## 2013-06-01 DIAGNOSIS — I252 Old myocardial infarction: Secondary | ICD-10-CM | POA: Diagnosis not present

## 2013-06-01 NOTE — Telephone Encounter (Signed)
Amy with [facility] OT called to get an okay to see patient twice weekly for 4 weeks. Verbal okay given.

## 2013-06-03 ENCOUNTER — Telehealth: Payer: Self-pay | Admitting: Neurology

## 2013-06-03 NOTE — Telephone Encounter (Signed)
Spoke with patient. She has been taking her Azilect and Melatonin for 6 days. She states these are not working. She is having trouble moving and feeding herself. I advised her to keep taking the medications for another week and check back in. I encouraged her that she has to give the medications time to work. She states she will do this and we will follow up in a week.

## 2013-06-03 NOTE — Telephone Encounter (Signed)
Pt called requesting to speak to a nurse regarding her medication. Please call the Pt.

## 2013-06-04 NOTE — Telephone Encounter (Signed)
She can d/c the azilect then.  Unfortunately, I don't think that I personally have more to offer her.  I am willing to send her to another movement specialist if that is what she wants, but I don't have any more suggestions.

## 2013-06-04 NOTE — Telephone Encounter (Signed)
Spoke with patient to let her know she could d/c medication and she feels better today. I advised she can continue another week and call with an update.

## 2013-06-08 DIAGNOSIS — F3289 Other specified depressive episodes: Secondary | ICD-10-CM | POA: Diagnosis not present

## 2013-06-08 DIAGNOSIS — G20 Parkinson's disease: Secondary | ICD-10-CM | POA: Diagnosis not present

## 2013-06-08 DIAGNOSIS — D649 Anemia, unspecified: Secondary | ICD-10-CM | POA: Diagnosis not present

## 2013-06-08 DIAGNOSIS — I252 Old myocardial infarction: Secondary | ICD-10-CM | POA: Diagnosis not present

## 2013-06-08 DIAGNOSIS — I10 Essential (primary) hypertension: Secondary | ICD-10-CM | POA: Diagnosis not present

## 2013-06-08 DIAGNOSIS — Z5189 Encounter for other specified aftercare: Secondary | ICD-10-CM | POA: Diagnosis not present

## 2013-06-08 DIAGNOSIS — F329 Major depressive disorder, single episode, unspecified: Secondary | ICD-10-CM | POA: Diagnosis not present

## 2013-06-10 DIAGNOSIS — G20 Parkinson's disease: Secondary | ICD-10-CM | POA: Diagnosis not present

## 2013-06-10 DIAGNOSIS — F329 Major depressive disorder, single episode, unspecified: Secondary | ICD-10-CM | POA: Diagnosis not present

## 2013-06-10 DIAGNOSIS — Z5189 Encounter for other specified aftercare: Secondary | ICD-10-CM | POA: Diagnosis not present

## 2013-06-10 DIAGNOSIS — I252 Old myocardial infarction: Secondary | ICD-10-CM | POA: Diagnosis not present

## 2013-06-10 DIAGNOSIS — I10 Essential (primary) hypertension: Secondary | ICD-10-CM | POA: Diagnosis not present

## 2013-06-10 DIAGNOSIS — F3289 Other specified depressive episodes: Secondary | ICD-10-CM | POA: Diagnosis not present

## 2013-06-10 DIAGNOSIS — D649 Anemia, unspecified: Secondary | ICD-10-CM | POA: Diagnosis not present

## 2013-06-18 DIAGNOSIS — I10 Essential (primary) hypertension: Secondary | ICD-10-CM | POA: Diagnosis not present

## 2013-06-18 DIAGNOSIS — I252 Old myocardial infarction: Secondary | ICD-10-CM | POA: Diagnosis not present

## 2013-06-18 DIAGNOSIS — G20 Parkinson's disease: Secondary | ICD-10-CM | POA: Diagnosis not present

## 2013-06-18 DIAGNOSIS — D649 Anemia, unspecified: Secondary | ICD-10-CM | POA: Diagnosis not present

## 2013-06-18 DIAGNOSIS — Z5189 Encounter for other specified aftercare: Secondary | ICD-10-CM | POA: Diagnosis not present

## 2013-06-18 DIAGNOSIS — F3289 Other specified depressive episodes: Secondary | ICD-10-CM | POA: Diagnosis not present

## 2013-06-18 DIAGNOSIS — F329 Major depressive disorder, single episode, unspecified: Secondary | ICD-10-CM | POA: Diagnosis not present

## 2013-06-19 DIAGNOSIS — G20 Parkinson's disease: Secondary | ICD-10-CM | POA: Diagnosis not present

## 2013-06-19 DIAGNOSIS — F329 Major depressive disorder, single episode, unspecified: Secondary | ICD-10-CM | POA: Diagnosis not present

## 2013-06-19 DIAGNOSIS — F3289 Other specified depressive episodes: Secondary | ICD-10-CM | POA: Diagnosis not present

## 2013-06-19 DIAGNOSIS — I10 Essential (primary) hypertension: Secondary | ICD-10-CM | POA: Diagnosis not present

## 2013-06-19 DIAGNOSIS — Z5189 Encounter for other specified aftercare: Secondary | ICD-10-CM | POA: Diagnosis not present

## 2013-06-19 DIAGNOSIS — D649 Anemia, unspecified: Secondary | ICD-10-CM | POA: Diagnosis not present

## 2013-06-19 DIAGNOSIS — I252 Old myocardial infarction: Secondary | ICD-10-CM | POA: Diagnosis not present

## 2013-06-23 ENCOUNTER — Telehealth: Payer: Self-pay | Admitting: Neurology

## 2013-06-23 DIAGNOSIS — I252 Old myocardial infarction: Secondary | ICD-10-CM | POA: Diagnosis not present

## 2013-06-23 DIAGNOSIS — G20 Parkinson's disease: Secondary | ICD-10-CM | POA: Diagnosis not present

## 2013-06-23 DIAGNOSIS — I10 Essential (primary) hypertension: Secondary | ICD-10-CM | POA: Diagnosis not present

## 2013-06-23 DIAGNOSIS — Z5189 Encounter for other specified aftercare: Secondary | ICD-10-CM | POA: Diagnosis not present

## 2013-06-23 DIAGNOSIS — F329 Major depressive disorder, single episode, unspecified: Secondary | ICD-10-CM | POA: Diagnosis not present

## 2013-06-23 DIAGNOSIS — F3289 Other specified depressive episodes: Secondary | ICD-10-CM | POA: Diagnosis not present

## 2013-06-23 DIAGNOSIS — D649 Anemia, unspecified: Secondary | ICD-10-CM | POA: Diagnosis not present

## 2013-06-23 NOTE — Telephone Encounter (Signed)
Pt called requesting to speak to a nurse regarding her medications.

## 2013-06-23 NOTE — Telephone Encounter (Signed)
Patient is now wanting to try the Carbidopa Levodopa. Please advise.

## 2013-06-24 MED ORDER — CARBIDOPA-LEVODOPA 25-100 MG PO TABS: 0.5000 | ORAL_TABLET | Freq: Three times a day (TID) | ORAL | Status: DC

## 2013-06-24 NOTE — Telephone Encounter (Signed)
RX sent to pharmacy. Patient made aware. She states she does not know if she will pick it up. I made her aware that it would be available if she wanted to start the medication.

## 2013-06-24 NOTE — Telephone Encounter (Signed)
Okay.  1/2 tid before meals.

## 2013-08-11 ENCOUNTER — Ambulatory Visit: Payer: Medicare Other | Admitting: Family Medicine

## 2013-08-11 DIAGNOSIS — Z0289 Encounter for other administrative examinations: Secondary | ICD-10-CM

## 2013-08-13 ENCOUNTER — Telehealth: Payer: Self-pay

## 2013-08-13 MED ORDER — TRAZODONE HCL 50 MG PO TABS: 25.0000 mg | ORAL_TABLET | Freq: Every evening | ORAL | Status: DC | PRN

## 2013-08-13 NOTE — Telephone Encounter (Signed)
Mr Marinos brought pt today for appt; pt did not have appt today but had appt on 08/11/13; Mr Houlahan said he was sure appt was today and does not want to be charged for no show on 08/11/13. Mr Duell rescheduled appt on 08/20/13. Mr Moffitt said that Melatonin 3 mg taking one at night makes pt dizzy and request to stop Melatonin and get Trazodone 25 mg sent to [facility]. Mr [facility] request cb.

## 2013-08-13 NOTE — Telephone Encounter (Signed)
Spoke to pt and informed her Rx has been sent to requested pharmacy 

## 2013-08-13 NOTE — Telephone Encounter (Signed)
Ok to not charge them and to send in rx for trazadone as entered.  For some reason, will not let me e prescribe this today.

## 2013-08-14 ENCOUNTER — Other Ambulatory Visit: Payer: Self-pay | Admitting: Family Medicine

## 2013-08-20 ENCOUNTER — Ambulatory Visit (INDEPENDENT_AMBULATORY_CARE_PROVIDER_SITE_OTHER): Payer: Medicare Other | Admitting: Family Medicine

## 2013-08-20 ENCOUNTER — Telehealth: Payer: Self-pay | Admitting: Family Medicine

## 2013-08-20 ENCOUNTER — Encounter: Payer: Self-pay | Admitting: Family Medicine

## 2013-08-20 VITALS — BP 162/92 | HR 77 | Temp 98.3°F | Wt 129.2 lb

## 2013-08-20 DIAGNOSIS — R634 Abnormal weight loss: Secondary | ICD-10-CM

## 2013-08-20 DIAGNOSIS — G4700 Insomnia, unspecified: Secondary | ICD-10-CM

## 2013-08-20 DIAGNOSIS — I10 Essential (primary) hypertension: Secondary | ICD-10-CM

## 2013-08-20 DIAGNOSIS — G20 Parkinson's disease: Secondary | ICD-10-CM | POA: Diagnosis not present

## 2013-08-20 MED ORDER — AMANTADINE HCL 100 MG PO CAPS: 100.0000 mg | ORAL_CAPSULE | Freq: Two times a day (BID) | ORAL | Status: DC

## 2013-08-20 MED ORDER — ATENOLOL 50 MG PO TABS: ORAL_TABLET | ORAL | Status: DC

## 2013-08-20 NOTE — Telephone Encounter (Signed)
Relevant patient education mailed to patient.  

## 2013-08-20 NOTE — Assessment & Plan Note (Signed)
Stable on trazadone.   

## 2013-08-20 NOTE — Assessment & Plan Note (Addendum)
Non compliant. I had a long talk with Mr and Mrs. Totherow today.  They do not want to continuing going to neurology and they know she is non compliant.  Given the severity of her disease and her overall status and prognosis, I am ok with refilling her amantadine as long as she realizes that I am not a neurologist and will not be able to offer the wonderful expertise Dr. Arbutus Leas can offer.  She is no longer taking amitriptyline.  Advised to cancel future appointments with Dr. Arbutus Leas if this is what they are deciding to do and I will let Dr. Arbutus Leas know.  Will forward this note to her.

## 2013-08-20 NOTE — Assessment & Plan Note (Signed)
Probable scale error although I am sure she has a component of protein calorie malnutrition. She says she cannot tolerate ensure or other shakes as they raise her BP.

## 2013-08-20 NOTE — Progress Notes (Signed)
80 yo with h/o severe depression and PD here with her husband to "discuss medications."    PD- has been non compliant for years.  Was seeing Dr. Arbutus Leas but does not follow her recommendations.  Most recently called Dr. Arbutus Leas in 06/2013 asking for her to call in sinemet but pt stated during that call she was not sure if she would take it.  She did not pick it up because she went back on the Amantadine.  Husband thinks she is doing better than she has done in years. Last saw Dr. Arbutus Leas on 05/21/13.  She does not want to go back to Dr. Arbutus Leas because it is hard for her to go see to multiple doctors.  She would like for me to refill amantadine. Does not want to take sinemet.  HTN- BP elevated today. BP has been running as high in 200s. Per pt's husband, has been normal at home now.  Husband thinks her metoprolol was a different brand from pharmacy- was a capsule- now better on atenolol 25 mg daily on her "old tablets." Has not needed to take lasix in several months. No HA, CP or SOB.   Insomnia- feels improved with Trazadone.   Patient Active Problem List   Diagnosis Date Noted  . Edema 11/12/2012  . Fracture of femoral neck, right 04/19/2012  . Fracture of fifth metacarpal bone of right hand 04/19/2012  . Leg edema 01/03/2012  . Poor circulation of extremity 08/01/2011  . HEARING LOSS, BILATERAL 04/11/2010  . UNSPECIFIED ANEMIA 02/23/2010  . DEPRESSION 10/01/2008  . PARKINSON'S DISEASE 10/01/2008  . HYPERTENSION 10/01/2008  . MYOCARDIAL INFARCTION, HX OF 10/01/2008  . INSOMNIA 08/05/2007  . HYPOKALEMIA 04/03/2007   Past Medical History  Diagnosis Date  . Depression     s/p ECT therapy  . Hypertension   . Myocardial infarction 1987  . Parkinson's disease   . Fall at home    Past Surgical History  Procedure Laterality Date  . Back surgery  2003 & 01/1989  . Abdominal hysterectomy  1995  . Bladder surgery  09/1988  . Electroconvulsive therapy    . Hip arthroplasty  04/19/2012    Procedure:  ARTHROPLASTY BIPOLAR HIP;  Surgeon: Dannielle Huh, MD;  Location: [facility] OR;  Service: Orthopedics;  Laterality: Right;   History  Substance Use Topics  . Smoking status: Never Smoker   . Smokeless tobacco: Never Used  . Alcohol Use: No   Family History  Problem Relation Age of Onset  . Angina Mother     and MI  . Alcohol abuse Other     brain tumor  . Diabetes Other    No Known Allergies Current Outpatient Prescriptions on File Prior to Visit  Medication Sig Dispense Refill  . amantadine (SYMMETREL) 100 MG capsule Take 100 mg by mouth 2 (two) times daily. 1 pill in am, 1/2 pill pm      . atenolol (TENORMIN) 50 MG tablet TAKE 1/2 TABLET BY MOUTH EVERY EVENING  45 tablet  0  . carbidopa-levodopa (SINEMET IR) 25-100 MG per tablet Take 0.5 tablets by mouth 3 (three) times daily.  45 tablet  2  . furosemide (LASIX) 20 MG tablet TAKE 1 TAB DAILY, IF NEEDED1 TAB IF WEIGHT GAIN GREATER THAN 2 LBS OR INCREASED LOWER EXREMITY EDEMA  60 tablet  5  . Misc. Devices (TRANSPORT CHAIR) MISC 1 Device by Does not apply route as needed.  1 each  0  . traZODone (DESYREL) 50 MG tablet Take  0.5 tablets (25 mg total) by mouth at bedtime as needed for sleep.  30 tablet  0  . [DISCONTINUED] potassium chloride (K-DUR) 10 MEQ tablet TAKE 1 TABLET BY MOUTH EVERY DAY  30 tablet  3   No current facility-administered medications on file prior to visit.   The PMH, PSH, Social History, Family History, Medications, and allergies have [facility], and have been updated if relevant.  See HPI  Pt and her husband feel appetite good but has lost weight although I think there was a scale error in 05/2013- weighed 126 in 04/2011. Wt Readings from Last 3 Encounters:  08/20/13 129 lb 4 oz (58.627 kg)  05/21/13 140 lb (63.504 kg)  11/18/12 133 lb (60.328 kg)    Physical Exam  BP 162/92  Pulse 77  Temp(Src) 98.3 F (36.8 C) (Oral)  Wt 129 lb 4 oz (58.627 kg)  SpO2 97%   BP Readings from Last 3 Encounters:   08/20/13 162/92  05/21/13 138/98  11/18/12 116/84     General: alert, well-developed, well-nourished, and well-hydrated. NAD sitting quietly in wheel chair, in good spirits today.  Head: normocephalic, atraumatic, and no abnormalities observed.  Eyes: pupils equal, pupils round, and no injection.  Ears: R ear normal and L ear normal.  Nose: no external deformity.  Mouth: no gingival abnormalities.  Lungs: normal respiratory effort, no intercostal retractions, no accessory muscle use, and normal breath sounds.  Heart: occasional extra beat--no pattern noted, no murmur heard  Abdomen: soft, non-tender, normal bowel sounds, and no guarding.  Msk: able to raise both arms above her head slowly  Neurologic: alert & oriented X3,-tremors in both hands at rest Ext: No edema today Psych: normally interactive, flat affect, and subdued.

## 2013-08-20 NOTE — Assessment & Plan Note (Signed)
Deteriorated. Husband believes this was due to atenolol capsules, I have refilled the tablets.  He says she is normotensive at home.  He will call me next week with her readings, sooner if they remain elevated.

## 2013-08-20 NOTE — Patient Instructions (Signed)
Please keep a check on your blood pressure and call me with an update. I refilled your amantadine and atenolol.

## 2013-08-20 NOTE — Progress Notes (Signed)
Pre visit review using our clinic review tool, if applicable. No additional management support is needed unless otherwise documented below in the visit note. 

## 2013-10-06 ENCOUNTER — Other Ambulatory Visit: Payer: Self-pay | Admitting: Family Medicine

## 2013-10-07 ENCOUNTER — Other Ambulatory Visit: Payer: Self-pay

## 2013-10-07 MED ORDER — AMANTADINE HCL 100 MG PO CAPS: 100.0000 mg | ORAL_CAPSULE | Freq: Two times a day (BID) | ORAL | Status: DC

## 2013-10-07 NOTE — Telephone Encounter (Signed)
V/M requesting refill amantadine; refill was done on 08/20/13 electronically to [facility]; spoke with Thurnell [facility]Yow at [facility] and did not receive refill electronically; was going to give verbally but needed clarification of directions; 1 cap by mouth 2 times daily. Then 1 pill in AM and 1/2 pill in PM. Please clarify directions. V/m requested cb to pt.

## 2013-10-08 ENCOUNTER — Telehealth: Payer: Self-pay

## 2013-10-08 NOTE — Telephone Encounter (Signed)
Pt request refill atenolol; spoke with [facility] last received atenolol 50 mg # 45 on 08/14/13; too early for pt to pick up unless pays out of pocket $14.00 for # 45. Pt said she does not remember why but pt has been taking atenolol 50 mg 1/4 tab in AM and 1.2 tab at PM. Today pts BP was 151/84. Pt has 2 1/2 tabs left. Pt request cb with instructions of how to take atenolol. Pt will take 1/2 tab tonight and request cb on 10/09/13.

## 2013-10-09 MED ORDER — ATENOLOL 50 MG PO TABS: ORAL_TABLET | ORAL | Status: DC

## 2013-10-09 NOTE — Telephone Encounter (Signed)
We have that she is supposed to be taking a 1/2 tab every evening.  I'm not quite sure why she has been taking it like that.  Let's change instructions to 1/2 tab twice daily and send new rx to pharmacy.  Follow up with us in 2 weeks to recheck her BP.

## 2013-10-09 NOTE — Telephone Encounter (Signed)
Spoke to pt and husband and informed her of new instruction. Rx sent to requested pharmacy; states that she will cb to schedule f/u

## 2013-10-12 ENCOUNTER — Other Ambulatory Visit: Payer: Self-pay

## 2013-10-12 MED ORDER — AMANTADINE HCL 100 MG PO TABS: 100.0000 mg | ORAL_TABLET | Freq: Two times a day (BID) | ORAL | Status: DC

## 2013-10-12 NOTE — Telephone Encounter (Signed)
Ok to fill as requested.

## 2013-10-12 NOTE — Telephone Encounter (Signed)
Aurea GraffJoan said pt cannot take amantadine in capsule form; Mr [name] [facility] and [facility] will be faxing refill  Request to [phone] for amantadine 100 mg tab. Aurea GraffJoan said amantadine 100 mg with instructions to take 1 tablet by mouth twice daily.Please advise.

## 2013-10-29 ENCOUNTER — Encounter: Payer: Self-pay | Admitting: Family Medicine

## 2013-10-29 ENCOUNTER — Ambulatory Visit (INDEPENDENT_AMBULATORY_CARE_PROVIDER_SITE_OTHER): Payer: Medicare Other | Admitting: Family Medicine

## 2013-10-29 VITALS — BP 160/98 | HR 88 | Temp 98.0°F | Wt 130.0 lb

## 2013-10-29 DIAGNOSIS — G20 Parkinson's disease: Secondary | ICD-10-CM

## 2013-10-29 DIAGNOSIS — G4700 Insomnia, unspecified: Secondary | ICD-10-CM | POA: Diagnosis not present

## 2013-10-29 DIAGNOSIS — R634 Abnormal weight loss: Secondary | ICD-10-CM | POA: Diagnosis not present

## 2013-10-29 DIAGNOSIS — I10 Essential (primary) hypertension: Secondary | ICD-10-CM

## 2013-10-29 MED ORDER — AMANTADINE HCL 100 MG PO TABS: 100.0000 mg | ORAL_TABLET | Freq: Two times a day (BID) | ORAL | Status: DC

## 2013-10-29 MED ORDER — TRAZODONE HCL 50 MG PO TABS: 25.0000 mg | ORAL_TABLET | Freq: Every evening | ORAL | Status: DC | PRN

## 2013-10-29 MED ORDER — ATENOLOL 50 MG PO TABS: ORAL_TABLET | ORAL | Status: DC

## 2013-10-29 NOTE — Assessment & Plan Note (Signed)
Well controlled on  Trazodone. 

## 2013-10-29 NOTE — Assessment & Plan Note (Signed)
Improved- weight has stabilized.

## 2013-10-29 NOTE — Assessment & Plan Note (Signed)
Deteriorated but has been a little better on home readings. She is asymptomatic and refuses to adjust her bp meds. No changes made today.

## 2013-10-29 NOTE — Progress Notes (Signed)
80 yo with h/o severe depression and PD here with her husband to discuss medication and to follow up BP.   PD- has been non compliant for years.  Was seeing Dr. Arbutus Leas but does not follow her recommendations.  Most recently started Azilect.  She doesn't want to take it anymore.  Says it elevates her BP. She does not want to take anything other than amantadine for her PD and does not want to follow up with any neurologists. She has been working with [facility] which her husband feels has really helped with transfers.  She did unfortunately fall last week.  HTN- BP elevated today. BP has been running as high in 200s. Brings in BP log- running 131/89- 160/77.  There was confusion about how she was supposed to take her atenolol.  She phone note from 7/23:   Pt said she does not remember why but pt has been taking atenolol 50 mg 1/4 tab in AM and 1.2 tab at PM. Today pts BP was 151/84. Pt has 2 1/2 tabs left. Pt request cb with instructions of how to take atenolol.  We had listed in med list that she was supposed to be taking a 1/2 tab every evening. Advised her to take 1/2 tab twice daily and follow up here today.  Has not needed to take lasix in several months. No HA, CP or SOB.  Insomnia- feels improved with Trazadone.   Patient Active Problem List   Diagnosis Date Noted  . Loss of weight 08/20/2013  . Edema 11/12/2012  . Fracture of femoral neck, right 04/19/2012  . Fracture of fifth metacarpal bone of right hand 04/19/2012  . Leg edema 01/03/2012  . Poor circulation of extremity 08/01/2011  . HEARING LOSS, BILATERAL 04/11/2010  . UNSPECIFIED ANEMIA 02/23/2010  . DEPRESSION 10/01/2008  . PARKINSON'S DISEASE 10/01/2008  . HYPERTENSION 10/01/2008  . MYOCARDIAL INFARCTION, HX OF 10/01/2008  . INSOMNIA 08/05/2007  . HYPOKALEMIA 04/03/2007   Past Medical History  Diagnosis Date  . Depression     s/p ECT therapy  . Hypertension   . Myocardial infarction 1987  . Parkinson's disease   .  Fall at home    Past Surgical History  Procedure Laterality Date  . Back surgery  2003 & 01/1989  . Abdominal hysterectomy  1995  . Bladder surgery  09/1988  . Electroconvulsive therapy    . Hip arthroplasty  04/19/2012    Procedure: ARTHROPLASTY BIPOLAR HIP;  Surgeon: Dannielle Huh, MD;  Location: [facility] OR;  Service: Orthopedics;  Laterality: Right;   History  Substance Use Topics  . Smoking status: Never Smoker   . Smokeless tobacco: Never Used  . Alcohol Use: No   Family History  Problem Relation Age of Onset  . Angina Mother     and MI  . Alcohol abuse Other     brain tumor  . Diabetes Other    No Known Allergies Current Outpatient Prescriptions on File Prior to Visit  Medication Sig Dispense Refill  . Misc. Devices (TRANSPORT CHAIR) MISC 1 Device by Does not apply route as needed.  1 each  0  . [DISCONTINUED] potassium chloride (K-DUR) 10 MEQ tablet TAKE 1 TABLET BY MOUTH EVERY DAY  30 tablet  3   No current facility-administered medications on file prior to visit.   The PMH, PSH, Social History, Family History, Medications, and allergies have been reviewed in [facility], and have been updated if relevant.  See HPI  Appetite good- weight is  stable from June  Wt Readings from Last 3 Encounters:  10/29/13 130 lb (58.968 kg)  08/20/13 129 lb 4 oz (58.627 kg)  05/21/13 140 lb (63.504 kg)   No dizziness No CP No blurred vision No LE edema No HA  Physical Exam  BP 160/98  Pulse 88  Temp(Src) 98 F (36.7 C) (Oral)  Wt 130 lb (58.968 kg)  SpO2 94%   BP Readings from Last 3 Encounters:  10/29/13 160/98  08/20/13 162/92  05/21/13 138/98     General: alert, well-developed, well-nourished, and well-hydrated. NAD sitting quietly in wheel chair, pleasant  Head: normocephalic, atraumatic, and no abnormalities observed.  Eyes: pupils equal, pupils round, and no injection.  Ears: R ear normal and L ear normal.  Nose: no external deformity.  Mouth: no gingival abnormalities.   Lungs: normal respiratory effort, no intercostal retractions, no accessory muscle use, and normal breath sounds.  Heart: RRR Abdomen: soft, non-tender, normal bowel sounds, and no guarding.  Msk: able to raise both arms above her head slowly  Neurologic: alert & oriented X3,-tremors in both hands at rest Ext: No edema today Psych: normally interactive, flat affect, and subdued.

## 2013-10-29 NOTE — Progress Notes (Signed)
Pre visit review using our clinic review tool, if applicable. No additional management support is needed unless otherwise documented below in the visit note. 

## 2013-10-29 NOTE — Assessment & Plan Note (Addendum)
Progressive and remains non compliant. I had another long discussion with the Wyricks today. >25 minutes spent in face to face time with patient, >50% spent in counselling or coordination of care    Again they tell me that do not want to follow up with neuro and would like to stop her new PD medication.   Advised her to call Dr. Don Perkingat's office to discuss and inform them of this. The patient indicates understanding of these issues and agrees with the plan.

## 2013-11-26 ENCOUNTER — Ambulatory Visit: Payer: Medicare Other | Admitting: Neurology

## 2013-11-30 ENCOUNTER — Telehealth: Payer: Self-pay | Admitting: Neurology

## 2013-11-30 NOTE — Telephone Encounter (Signed)
Pt was scheduled for a 6 month follow up appt this month. Pt cancelled appt / Sherri S.

## 2013-12-25 ENCOUNTER — Telehealth: Payer: Self-pay

## 2013-12-25 NOTE — Telephone Encounter (Signed)
Pt left v/m; pt wants trazodone instructions changed to take 3/4 tablet instead of 1/2 tab at hs prn.[facility].Please advise.

## 2013-12-25 NOTE — Telephone Encounter (Signed)
Ok to change as requested but I think that will be very hard to cut into 3/4 tablet

## 2013-12-28 NOTE — Telephone Encounter (Signed)
Lm on pts vm advising per Dr Dayton MartesAron. Med list updated to reflect changed

## 2014-02-05 ENCOUNTER — Other Ambulatory Visit: Payer: Self-pay | Admitting: *Deleted

## 2014-02-05 MED ORDER — AMANTADINE HCL 100 MG PO TABS: 100.0000 mg | ORAL_TABLET | Freq: Two times a day (BID) | ORAL | Status: DC

## 2014-02-05 NOTE — Telephone Encounter (Signed)
Last appt was 10/29/13, #120 last filled on 12/12/13.

## 2014-04-06 ENCOUNTER — Telehealth: Payer: Self-pay

## 2014-04-06 NOTE — Telephone Encounter (Signed)
Per Dr Dayton MartesAron, we are not aware of how to change a pts tier. What tier a medication is classified in is determined by the insurance company and we are not able to alter that. The only option she has is to contact the insurance to see what similar meds are covered under Tier 2 and she could possibly have a new medication sent.

## 2014-04-06 NOTE — Telephone Encounter (Signed)
Spoke to Mallory Perez at [facility] who states that she does not show a discrepancy in Tiers. Pt states that this medication does not come in tablets, only capsules and liquid. She states that she will still fax exception form, anyway, for completion

## 2014-04-06 NOTE — Telephone Encounter (Signed)
Pt called and requested tier change; pt gave phone to YRC WorldwideJoan Apple, a friend to give other information; pt request change amantadine from Tier III to Tier II [facility] and [facility] by [facility]. Pt request Dr Elmer SowAron's CMA to call and request tier change to [phone]. Tier II is much less expensive.

## 2014-04-06 NOTE — Telephone Encounter (Signed)
A tier exception can be requested by filling out a prior auth form and on form write tier exception on form. There is no guarantee a tier exception will be given but it is possible. If tier exception is denied then the next step would be to request a substitute med in lower tier. Verified this info by calling [phone] and speaking with Rocky LinkKen with [facility].

## 2014-04-06 NOTE — Telephone Encounter (Signed)
Opened in error

## 2014-04-07 NOTE — Telephone Encounter (Signed)
Form completed and faxed back to [facility]; sent for scanning.

## 2014-04-08 MED ORDER — AMANTADINE HCL 100 MG PO TABS: 100.0000 mg | ORAL_TABLET | Freq: Two times a day (BID) | ORAL | Status: DC

## 2014-04-08 NOTE — Telephone Encounter (Signed)
Copy faxed to pharmacy, mailed to pt and sent for scanning.

## 2014-04-08 NOTE — Telephone Encounter (Signed)
Spoke to pt, who asked me to contact her friend Aurea GraffJoan at [phone]. Contacted Aurea GraffJoan and informed her that fax received and states that there is no discrepancy with pts medication. This med is on pts list of covered drugs, per fax. New Rx sent to pharmacy for #180 as requested; 44mo supply

## 2014-04-08 NOTE — Addendum Note (Signed)
Addended by: Desmond DikeKNIGHT, Maybelline Kolarik H on: 04/08/2014 09:35 AM   Modules accepted: Orders

## 2014-04-16 ENCOUNTER — Telehealth: Payer: Self-pay | Admitting: Family Medicine

## 2014-04-16 NOTE — Telephone Encounter (Signed)
Spoke to pt and informed her that per insurance company this medication does not come in tablets, only capsules and liquid. See previous phone notes

## 2014-04-16 NOTE — Telephone Encounter (Signed)
tonya daughter in law came in stating [facility] picked up her amantadine  100mg   She received capsule  she wants pills.  Can another rx be sent in Please let pt know when this has been done  [place] stated pt was very upset that she received capsules  Please advise pt if anything can be done

## 2014-04-26 NOTE — Telephone Encounter (Signed)
Mallory Mallory Perez said he had spoken with ins co and pt can get tablets for amatadine 100 mg. Mallory Perez request cb.

## 2014-04-26 NOTE — Telephone Encounter (Signed)
[facility] who states that pt requested Rx [facility] only carries capsules. Advised pt that if she is wanting tablets then [facility]. Pt and husband verbally expressed understanding.

## 2014-04-27 ENCOUNTER — Other Ambulatory Visit: Payer: Self-pay

## 2014-04-27 NOTE — Telephone Encounter (Signed)
[facility]. Tried to contact [facility] and pharmacy was closed for lunch; Mr [name] said his son picked up # 180 capsules from [facility]. Mr [name] said pt had previously tried capsules and that caused pts BP to "sky rocket". Mr [name] hopes that ins will pay for tablets but if not he said he would pay out of pocket. Advised Mr [name] that [facility] # [facility] out of pocket cost to pt would be $221.00. [facility] request cb.

## 2014-04-28 MED ORDER — AMANTADINE HCL 100 MG PO TABS: 100.0000 mg | ORAL_TABLET | Freq: Two times a day (BID) | ORAL | Status: DC

## 2014-05-26 ENCOUNTER — Telehealth: Payer: Self-pay | Admitting: Family Medicine

## 2014-05-26 NOTE — Telephone Encounter (Signed)
Pt is returning call regarding medication. Pt requests cb . [phone]

## 2014-05-26 NOTE — Telephone Encounter (Signed)
I did not contact this pt; no recent phone notes in chart

## 2014-06-02 ENCOUNTER — Ambulatory Visit (INDEPENDENT_AMBULATORY_CARE_PROVIDER_SITE_OTHER): Payer: Medicare Other | Admitting: Family Medicine

## 2014-06-02 ENCOUNTER — Encounter: Payer: Self-pay | Admitting: Family Medicine

## 2014-06-02 VITALS — BP 146/88 | HR 65 | Temp 98.0°F | Wt 134.5 lb

## 2014-06-02 DIAGNOSIS — G20 Parkinson's disease: Secondary | ICD-10-CM

## 2014-06-02 DIAGNOSIS — I10 Essential (primary) hypertension: Secondary | ICD-10-CM

## 2014-06-02 DIAGNOSIS — R0602 Shortness of breath: Secondary | ICD-10-CM

## 2014-06-02 NOTE — Progress Notes (Signed)
Pre visit review using our clinic review tool, if applicable. No additional management support is needed unless otherwise documented below in the visit note. 

## 2014-06-02 NOTE — Assessment & Plan Note (Signed)
Resolved. Lung exam and VS reassuring. Call or return to clinic prn if these symptoms worsen or fail to improve as anticipated. The patient indicates understanding of these issues and agrees with the plan.

## 2014-06-02 NOTE — Progress Notes (Signed)
81 yo with h/o severe depression and PD here with her husband to discuss for follow up.  Did have some SOB with exertion last week but that has resolved.  Had some nasal drainage at the time and thinks that was the issue.  PD- has been non compliant for years.  Was seeing Dr. Arbutus Leasat but does not follow her recommendations. She is taking Amantadine.   HTN- improved.   BP has been running as high in 130s-140s/80s-90s Taking atenolol - she is compliant with this.     Has not needed to take lasix in almost a year. No HA, CP or SOB.     Patient Active Problem List   Diagnosis Date Noted  . Loss of weight 08/20/2013  . Edema 11/12/2012  . Fracture of femoral neck, right 04/19/2012  . Fracture of fifth metacarpal bone of right hand 04/19/2012  . Leg edema 01/03/2012  . Poor circulation of extremity 08/01/2011  . HEARING LOSS, BILATERAL 04/11/2010  . UNSPECIFIED ANEMIA 02/23/2010  . DEPRESSION 10/01/2008  . PARKINSON'S DISEASE 10/01/2008  . HYPERTENSION 10/01/2008  . MYOCARDIAL INFARCTION, HX OF 10/01/2008  . INSOMNIA 08/05/2007  . HYPOKALEMIA 04/03/2007   Past Medical History  Diagnosis Date  . Depression     s/p ECT therapy  . Hypertension   . Myocardial infarction 1987  . Parkinson's disease   . Fall at home    Past Surgical History  Procedure Laterality Date  . Back surgery  2003 & 01/1989  . Abdominal hysterectomy  1995  . Bladder surgery  09/1988  . Electroconvulsive therapy    . Hip arthroplasty  04/19/2012    Procedure: ARTHROPLASTY BIPOLAR HIP;  Surgeon: Dannielle HuhSteve Lucey, MD;  [facility];  Service: Orthopedics;  Laterality: Right;   History  Substance Use Topics  . Smoking status: Never Smoker   . Smokeless tobacco: Never Used  . Alcohol Use: No   Family History  Problem Relation Age of Onset  . Angina Mother     and MI  . Alcohol abuse Other     brain tumor  . Diabetes Other    No Known Allergies Current Outpatient Prescriptions on File Prior to Visit   Medication Sig Dispense Refill  . Amantadine HCl 100 MG tablet Take 1 tablet (100 mg total) by mouth 2 (two) times daily. 60 tablet 2  . atenolol (TENORMIN) 50 MG tablet Take 1/2 tab twice daily 90 tablet 0  . Misc. Devices (TRANSPORT CHAIR) MISC 1 Device by Does not apply route as needed. 1 each 0  . rasagiline (AZILECT) 0.5 MG TABS tablet Take 0.5 mg by mouth daily.    . traZODone (DESYREL) 50 MG tablet Take by mouth. Take 3/4 tab daily at bedtime    . [DISCONTINUED] potassium chloride (K-DUR) 10 MEQ tablet TAKE 1 TABLET BY MOUTH EVERY DAY 30 tablet 3   No current facility-administered medications on file prior to visit.   The PMH, PSH, Social History, Family History, Medications, and allergies have been [facility], and have been updated if relevant.  See HPI  Appetite good- weight is stable from June  Wt Readings from Last 3 Encounters:  06/02/14 134 lb 8 oz (61.009 kg)  10/29/13 130 lb (58.968 kg)  08/20/13 129 lb 4 oz (58.627 kg)   No dizziness No CP No blurred vision No LE edema No HA No cough +nasal congestion No sinus pressure No fever or chills No CP  Physical Exam  BP 146/88 mmHg  Pulse 65  Temp(Src) 98 F (36.7 C) (Oral)  Wt 134 lb 8 oz (61.009 kg)  SpO2 94%   BP Readings from Last 3 Encounters:  06/02/14 146/88  10/29/13 160/98  08/20/13 162/92     General: NAD sitting quietly in wheel chair, pleasant  Head: normocephalic, atraumatic, and no abnormalities observed.  Eyes: pupils equal, pupils round, and no injection.  Ears: R ear normal and L ear normal.  Nose: no external deformity.  Mouth: no gingival abnormalities.  Lungs: normal respiratory effort, no intercostal retractions, no accessory muscle use, and normal breath sounds.  Heart: RRR Abdomen: soft, non-tender, normal bowel sounds, and no guarding.  Msk: able to raise both arms above her head slowly  Neurologic: alert & oriented X3,-tremors in both hands at rest Ext: No edema today,  chronic venous changes Psych: normally interactive, flat affect, and subdued.

## 2014-06-02 NOTE — Assessment & Plan Note (Signed)
Improved. No changes to atenolol dosage today.

## 2014-06-02 NOTE — Assessment & Plan Note (Signed)
  She has been taking Amantidine and she and her husband seem pleased with how she is doing right now. They did ask for rx for a walker so she can get up and walk more.  I encouraged this but advised to be very careful since she broke her hip last year after a fall. The patient indicates understanding of these issues and agrees with the plan.

## 2014-06-22 ENCOUNTER — Other Ambulatory Visit: Payer: Self-pay | Admitting: *Deleted

## 2014-06-22 MED ORDER — ATENOLOL 50 MG PO TABS: ORAL_TABLET | ORAL | Status: DC

## 2014-07-23 ENCOUNTER — Telehealth: Payer: Self-pay | Admitting: Family Medicine

## 2014-07-23 NOTE — Telephone Encounter (Signed)
Please call patient back about her medication.  °

## 2014-07-23 NOTE — Telephone Encounter (Signed)
Spoke to pt who states that she is wanting refill of Lasix. Advised pt that this medication was d/c in 10/2013 and she will need a f/u appt to restart. Will speak with her husband and contact office back

## 2014-07-26 ENCOUNTER — Encounter: Payer: Self-pay | Admitting: *Deleted

## 2014-07-26 ENCOUNTER — Encounter: Payer: Self-pay | Admitting: Family Medicine

## 2014-07-26 ENCOUNTER — Ambulatory Visit (INDEPENDENT_AMBULATORY_CARE_PROVIDER_SITE_OTHER): Payer: Medicare Other | Admitting: Family Medicine

## 2014-07-26 VITALS — BP 158/88 | HR 138 | Temp 97.8°F

## 2014-07-26 DIAGNOSIS — R609 Edema, unspecified: Secondary | ICD-10-CM

## 2014-07-26 DIAGNOSIS — G20 Parkinson's disease: Secondary | ICD-10-CM | POA: Diagnosis not present

## 2014-07-26 DIAGNOSIS — I10 Essential (primary) hypertension: Secondary | ICD-10-CM | POA: Diagnosis not present

## 2014-07-26 LAB — BASIC METABOLIC PANEL
BUN: 15 mg/dL (ref 6–23)
CHLORIDE: 104 meq/L (ref 96–112)
CO2: 31 mEq/L (ref 19–32)
CREATININE: 0.85 mg/dL (ref 0.40–1.20)
Calcium: 9.4 mg/dL (ref 8.4–10.5)
GFR: 68.23 mL/min (ref >60.00)
Glucose, Bld: 102 mg/dL — ABNORMAL HIGH (ref 70–99)
Potassium: 4 mEq/L (ref 3.5–5.1)
Sodium: 139 mEq/L (ref 135–145)

## 2014-07-26 MED ORDER — FUROSEMIDE 20 MG PO TABS: ORAL_TABLET | ORAL | Status: DC

## 2014-07-26 MED ORDER — ATENOLOL 50 MG PO TABS: ORAL_TABLET | ORAL | Status: DC

## 2014-07-26 MED ORDER — TRAZODONE HCL 50 MG PO TABS: ORAL_TABLET | ORAL | Status: DC

## 2014-07-26 NOTE — Assessment & Plan Note (Signed)
No edema on exam today. Refilled eRx but advised her to use very cautiously and as directed. Check renal function and electrolytes today. The patient indicates understanding of these issues and agrees with the plan.

## 2014-07-26 NOTE — Progress Notes (Signed)
Pre visit review using our clinic review tool, if applicable. No additional management support is needed unless otherwise documented below in the visit note. 

## 2014-07-26 NOTE — Progress Notes (Signed)
81 yo with h/o severe depression and PD here with her husband and son for follow up.  PD- has been non compliant for years.  Was seeing Dr. Arbutus Leasat but does not follow her recommendations. She is taking Amantadine.   HTN- deteriorated again.  Checking BP at home- brings in log- ranging 160s- 180s/90-100s.  Taking atenolol 25 mg twice daily  - she is compliant with this.     Has not needed to take lasix in almost a year but her husband asks for a refill because she "tends to swell in the summer." No HA, CP or SOB.  Lab Results  Component Value Date   CREATININE 0.9 11/12/2012   Lab Results  Component Value Date   NA 140 11/12/2012   K 4.2 11/12/2012   CL 106 11/12/2012   CO2 29 11/12/2012      Patient Active Problem List   Diagnosis Date Noted  . SOB (shortness of breath) 06/02/2014  . Loss of weight 08/20/2013  . Edema 11/12/2012  . Fracture of femoral neck, right 04/19/2012  . Fracture of fifth metacarpal bone of right hand 04/19/2012  . Leg edema 01/03/2012  . Poor circulation of extremity 08/01/2011  . HEARING LOSS, BILATERAL 04/11/2010  . UNSPECIFIED ANEMIA 02/23/2010  . DEPRESSION 10/01/2008  . PARKINSON'S DISEASE 10/01/2008  . Essential hypertension 10/01/2008  . MYOCARDIAL INFARCTION, HX OF 10/01/2008  . INSOMNIA 08/05/2007  . HYPOKALEMIA 04/03/2007   Past Medical History  Diagnosis Date  . Depression     s/p ECT therapy  . Hypertension   . Myocardial infarction 1987  . Parkinson's disease   . Fall at home    Past Surgical History  Procedure Laterality Date  . Back surgery  2003 & 01/1989  . Abdominal hysterectomy  1995  . Bladder surgery  09/1988  . Electroconvulsive therapy    . Hip arthroplasty  04/19/2012    Procedure: ARTHROPLASTY BIPOLAR HIP;  Surgeon: Dannielle HuhSteve Lucey, MD;  [facility];  Service: Orthopedics;  Laterality: Right;   History  Substance Use Topics  . Smoking status: Never Smoker   . Smokeless tobacco: Never Used  . Alcohol Use: No    Family History  Problem Relation Age of Onset  . Angina Mother     and MI  . Alcohol abuse Other     brain tumor  . Diabetes Other    No Known Allergies Current Outpatient Prescriptions on File Prior to Visit  Medication Sig Dispense Refill  . Amantadine HCl 100 MG tablet Take 1 tablet (100 mg total) by mouth 2 (two) times daily. 60 tablet 2  . atenolol (TENORMIN) 50 MG tablet Take 1/2 tab twice daily 90 tablet 1  . Misc. Devices (TRANSPORT CHAIR) MISC 1 Device by Does not apply route as needed. 1 each 0  . traZODone (DESYREL) 50 MG tablet Take by mouth. Take 3/4 tab daily at bedtime    . rasagiline (AZILECT) 0.5 MG TABS tablet Take 0.5 mg by mouth daily.    . [DISCONTINUED] potassium chloride (K-DUR) 10 MEQ tablet TAKE 1 TABLET BY MOUTH EVERY DAY 30 tablet 3   No current facility-administered medications on file prior to visit.   The PMH, PSH, Social History, Family History, Medications, and allergies have been reviewed in [facility], and have been updated if relevant.  See HPI  Appetite good- weight is stable from June  Wt Readings from Last 3 Encounters:  06/02/14 134 lb 8 oz (61.009 kg)  10/29/13 130 lb (  58.968 kg)  08/20/13 129 lb 4 oz (58.627 kg)   No dizziness No CP No blurred vision No LE edema No HA No cough No CP  Physical Exam  BP 158/88 mmHg  Pulse 138  Temp(Src) 97.8 F (36.6 C) (Oral)  Wt   SpO2 95%   BP Readings from Last 3 Encounters:  07/26/14 158/88  06/02/14 146/88  10/29/13 160/98     General: NAD sitting quietly in wheel chair, pleasant  Head: normocephalic, atraumatic, and no abnormalities observed.  Eyes: pupils equal, pupils round, and no injection.  Ears: R ear normal and L ear normal.  Nose: no external deformity.  Mouth: no gingival abnormalities.  Lungs: normal respiratory effort, no intercostal retractions, no accessory muscle use, and normal breath sounds.  Heart: RRR Abdomen: soft, non-tender, normal bowel sounds, and no  guarding.  Msk: able to raise both arms above her head slowly  Neurologic: alert & oriented X3,-tremors in both hands at rest Ext: No edema today, chronic venous changes Psych: normally interactive, flat affect, and subdued.

## 2014-07-26 NOTE — Assessment & Plan Note (Signed)
Deteriorated. >25 minutes spent in face to face time with patient, >50% spent in counselling or coordination of care She is resistant to changes rxs- advised to increase atenolol to 25 mg three times daily and continue checking BP at home.  She finally agreed to this small change.

## 2014-07-26 NOTE — Patient Instructions (Signed)
It was good to see you. We are increasing your atenolol to 1/2 tablet three times daily.  We will call you with your lab results.

## 2014-07-27 ENCOUNTER — Telehealth: Payer: Self-pay | Admitting: Family Medicine

## 2014-07-27 NOTE — Telephone Encounter (Signed)
Patient's sitter wants you to call in a  refill for Amantadine 100 mg. [facility] [place].  Phone [phone]. Patient saw Dr.Aron yesterday and thought it would be refilled,but when patient's husband went to the pharmacy they told him there are no refills.  Patient is out of her medication.

## 2014-07-28 MED ORDER — AMANTADINE HCL 100 MG PO TABS: 100.0000 mg | ORAL_TABLET | Freq: Two times a day (BID) | ORAL | Status: DC

## 2014-07-28 NOTE — Telephone Encounter (Signed)
Rx sent to requested pharmacy

## 2014-07-28 NOTE — Addendum Note (Signed)
Addended by: Desmond DikeKNIGHT, Sherisa Gilvin H on: 07/28/2014 09:35 AM   Modules accepted: Orders

## 2014-08-24 ENCOUNTER — Telehealth: Payer: Self-pay

## 2014-08-24 DIAGNOSIS — Z96641 Presence of right artificial hip joint: Secondary | ICD-10-CM | POA: Diagnosis not present

## 2014-08-24 DIAGNOSIS — M25551 Pain in right hip: Secondary | ICD-10-CM | POA: Diagnosis not present

## 2014-08-24 DIAGNOSIS — Z471 Aftercare following joint replacement surgery: Secondary | ICD-10-CM | POA: Diagnosis not present

## 2014-08-24 NOTE — Telephone Encounter (Signed)
Lm on pts vm informing her that she is needing OV, per Dr Dayton MartesAron, and to contact our office to schedule

## 2014-08-24 NOTE — Telephone Encounter (Signed)
Please schedule OV.  

## 2014-08-24 NOTE — Telephone Encounter (Signed)
Mallory Perez left v/m (do not see DPR signed); pt's leg had been bothering her and pt saw Dr Sherlean FootLucey who did hip surgery in 2014; Dr Sherlean FootLucey made comment to family about possible med to build bones. Mallory request cb about med to help for osteoporosis.

## 2014-08-27 ENCOUNTER — Telehealth: Payer: Self-pay | Admitting: Family Medicine

## 2014-08-27 NOTE — Telephone Encounter (Signed)
Son Genevie Cheshire) called in wanting to know if pt has ever been tested for osteoporosis. Please call [phone].

## 2014-08-27 NOTE — Telephone Encounter (Signed)
Spoke to Hilton and informed him there is no DPR on file and was unable to speak with him; requested we contact pt. Spoke to pt and advised that she has not had recent bone density. Pt states she believes its been appx 62yrs

## 2014-08-30 ENCOUNTER — Telehealth: Payer: Self-pay | Admitting: Family Medicine

## 2014-08-30 DIAGNOSIS — Z13820 Encounter for screening for osteoporosis: Secondary | ICD-10-CM

## 2014-08-30 NOTE — Telephone Encounter (Signed)
Patient saw Dr.Lucey and he asked for her to ask her PCP to order a Bone Density Test.  Patient said the last time she had a bone density done was about 20 years ago at Dr.Aplington's office.

## 2014-08-30 NOTE — Telephone Encounter (Signed)
Order entered

## 2014-09-01 ENCOUNTER — Telehealth: Payer: Self-pay | Admitting: Family Medicine

## 2014-09-01 DIAGNOSIS — E2839 Other primary ovarian failure: Secondary | ICD-10-CM

## 2014-09-01 NOTE — Telephone Encounter (Signed)
Left message asking pt to call office Please see if she has had a bone density if so where at. If not ask if she wants to go to [place] or [place] Any day and time??

## 2014-09-02 NOTE — Telephone Encounter (Signed)
Ok to change as requested

## 2014-09-02 NOTE — Telephone Encounter (Signed)
Called to schedule bone density   Dx needs to be post menopausal So insurance will pay Can you change  thanks

## 2014-09-02 NOTE — Telephone Encounter (Signed)
I reviewed pts chart and that is the Dx you linked it to when placing order

## 2014-09-07 NOTE — Telephone Encounter (Signed)
Called to schedule bone density Medicare will not pay for  Screening for osteoporosis or post menopausal   They said you can use estrogen deficiency or vit d deficiency

## 2014-09-07 NOTE — Telephone Encounter (Signed)
Appointment 6/29 [facility] [place]  Pt aware

## 2014-09-07 NOTE — Telephone Encounter (Signed)
Dx changed as requested.

## 2014-09-15 ENCOUNTER — Ambulatory Visit
Admission: RE | Admit: 2014-09-15 | Discharge: 2014-09-15 | Disposition: A | Discharge status: Home / Self Care | Payer: Medicare Other | Source: Ambulatory Visit | Attending: Family Medicine | Admitting: Family Medicine

## 2014-09-15 DIAGNOSIS — M810 Age-related osteoporosis without current pathological fracture: Secondary | ICD-10-CM | POA: Diagnosis not present

## 2014-09-15 DIAGNOSIS — E2839 Other primary ovarian failure: Secondary | ICD-10-CM | POA: Diagnosis not present

## 2014-09-27 ENCOUNTER — Ambulatory Visit (INDEPENDENT_AMBULATORY_CARE_PROVIDER_SITE_OTHER): Payer: Medicare Other | Admitting: Family Medicine

## 2014-09-27 ENCOUNTER — Encounter: Payer: Self-pay | Admitting: Family Medicine

## 2014-09-27 VITALS — BP 140/88 | HR 84 | Temp 97.6°F | Ht 64.0 in | Wt 135.2 lb

## 2014-09-27 DIAGNOSIS — M810 Age-related osteoporosis without current pathological fracture: Secondary | ICD-10-CM | POA: Diagnosis not present

## 2014-09-27 MED ORDER — ALENDRONATE SODIUM 70 MG PO TABS: 70.0000 mg | ORAL_TABLET | ORAL | Status: DC

## 2014-09-27 NOTE — Progress Notes (Signed)
Pre visit review using our clinic review tool, if applicable. No additional management support is needed unless otherwise documented below in the visit note. 

## 2014-09-27 NOTE — Progress Notes (Signed)
Subjective:   Patient ID: Mallory Perez, female    DOB: 21-Apr-1931, 81 y.o.   MRN: [id number]  ILEE Perez is a pleasant 81 y.o. year old female who with h/o PD, presents to clinic today with her husband and son for Follow-up  on 09/27/2014  HPI:  Osteoporosis- bone density showed T score of - 3/.  Spine could not be used in calculation because of h/o spine fusion with hardware.   She has had numerous fractures of past year and a half.  Wheelchair bound due to advanced PD. Current Outpatient Prescriptions on File Prior to Visit  Medication Sig Dispense Refill  . Amantadine HCl 100 MG tablet Take 1 tablet (100 mg total) by mouth 2 (two) times daily. 180 tablet 0  . atenolol (TENORMIN) 50 MG tablet Take 1/2 tab three times daily (Patient taking differently: 2 (two) times daily. Take 1/2 tab three times daily) 90 tablet 1  . Misc. Devices (TRANSPORT CHAIR) MISC 1 Device by Does not apply route as needed. 1 each 0  . traZODone (DESYREL) 50 MG tablet Take 3/4 tab daily at bedtime 30 tablet 3  . furosemide (LASIX) 20 MG tablet TAKE 1 TAB DAILY, IF NEEDED1 TAB IF WEIGHT GAIN GREATER THAN 2 LBS OR INCREASED LOWER EXREMITY EDEMA (Patient not taking: Reported on 09/27/2014) 60 tablet 5  . rasagiline (AZILECT) 0.5 MG TABS tablet Take 0.5 mg by mouth daily.    . [DISCONTINUED] potassium chloride (K-DUR) 10 MEQ tablet TAKE 1 TABLET BY MOUTH EVERY DAY 30 tablet 3   No current facility-administered medications on file prior to visit.    No Known Allergies  Past Medical History  Diagnosis Date  . Depression     s/p ECT therapy  . Hypertension   . Myocardial infarction 1987  . Parkinson's disease   . Fall at home     Past Surgical History  Procedure Laterality Date  . Back surgery  2003 & 01/1989  . Abdominal hysterectomy  1995  . Bladder surgery  09/1988  . Electroconvulsive therapy    . Hip arthroplasty  04/19/2012    Procedure: ARTHROPLASTY BIPOLAR HIP;  Surgeon: Dannielle Huh, MD;   Location[facility] OR;  Service: Orthopedics;  Laterality: Right;    Family History  Problem Relation Age of Onset  . Angina Mother     and MI  . Alcohol abuse Other     brain tumor  . Diabetes Other     History   Social History  . Marital Status: Married    Spouse Name: N/A  . Number of Children: N/A  . Years of Education: N/A   Occupational History  . Not on file.   Social History Main Topics  . Smoking status: Never Smoker   . Smokeless tobacco: Never Used  . Alcohol Use: No  . Drug Use: No  . Sexual Activity: Not on file   Other Topics Concern  . Not on file   Social History Narrative   The PMH, PSH, Social History, Family History, Medications, and allergies have been reviewed in [facility], and have been updated if relevant.  Review of Systems  Musculoskeletal: Positive for myalgias and back pain.  Psychiatric/Behavioral: Negative.   All other systems reviewed and are negative.      Objective:    BP 140/88 mmHg  Pulse 84  Temp(Src) 97.6 F (36.4 C) (Oral)  Ht  (1.626 m)  Wt 135 lb 4 oz (61.349 kg)  BMI 23.20  kg/m2   Physical Exam  Constitutional: She is oriented to person, place, and time. She appears well-developed and well-nourished. No distress.  In wheelchair, baseline resting tremor  Eyes: Conjunctivae are normal.  Cardiovascular: Normal rate.   Pulmonary/Chest: Effort normal.  Neurological: She is alert and oriented to person, place, and time. No cranial nerve deficit.  Psychiatric: She has a normal mood and affect. Her behavior is normal. Judgment and thought content normal.  Nursing note and vitals reviewed.         Assessment & Plan:   Osteoporosis No Follow-up on file.

## 2014-09-27 NOTE — Patient Instructions (Signed)
Good to see you. We are starting fosamax- 1 tablet weekly along with caltrate- calcium with vitamin  D daily - OTC.   Non dairy foods that contain calcium:  Kale, oranges, sardines, oatmeal, soy milk/soybeans, salmon, white beans, dried figs, turnip greens, almonds, broccoli, tofu.

## 2014-09-27 NOTE — Assessment & Plan Note (Signed)
New- >25 minutes spent in face to face time with patient, >50% spent in counselling or coordination of care. Discussed tx plan.  She is wheelchair bound so weight bearing exercises not an option.  Discussed dietary calcium sources- handout given, start OTC caltrate. eRx sent for fosamax. The patient indicates understanding of these issues and agrees with the plan.

## 2014-10-26 ENCOUNTER — Other Ambulatory Visit: Payer: Self-pay | Admitting: *Deleted

## 2014-10-26 MED ORDER — AMANTADINE HCL 100 MG PO TABS: 100.0000 mg | ORAL_TABLET | Freq: Two times a day (BID) | ORAL | Status: DC

## 2015-01-10 ENCOUNTER — Other Ambulatory Visit: Payer: Self-pay | Admitting: Family Medicine

## 2015-01-10 NOTE — Telephone Encounter (Signed)
Insomnia last addressed 10/2013 

## 2015-04-13 ENCOUNTER — Other Ambulatory Visit: Payer: Self-pay | Admitting: Family Medicine

## 2015-04-13 NOTE — Telephone Encounter (Signed)
Last f/u 05/2014 

## 2015-06-06 ENCOUNTER — Other Ambulatory Visit: Payer: Self-pay | Admitting: Family Medicine

## 2015-06-07 NOTE — Telephone Encounter (Signed)
Pt has not had any recent f/u appts 

## 2015-06-21 ENCOUNTER — Ambulatory Visit (INDEPENDENT_AMBULATORY_CARE_PROVIDER_SITE_OTHER): Payer: Medicare Other | Admitting: Family Medicine

## 2015-06-21 ENCOUNTER — Encounter: Payer: Self-pay | Admitting: Family Medicine

## 2015-06-21 VITALS — BP 178/98 | HR 69 | Temp 97.2°F

## 2015-06-21 DIAGNOSIS — G20 Parkinson's disease: Secondary | ICD-10-CM

## 2015-06-21 DIAGNOSIS — I739 Peripheral vascular disease, unspecified: Secondary | ICD-10-CM | POA: Diagnosis not present

## 2015-06-21 DIAGNOSIS — I10 Essential (primary) hypertension: Secondary | ICD-10-CM

## 2015-06-21 DIAGNOSIS — R0989 Other specified symptoms and signs involving the circulatory and respiratory systems: Secondary | ICD-10-CM

## 2015-06-21 NOTE — Patient Instructions (Signed)
Great to see you.  We are stopping your amantadine. Let's decrease it for a week to 100 mg daily, then 50 mg daily for 1 week and stop.  Try to increase your atenolol.

## 2015-06-21 NOTE — Progress Notes (Signed)
Pre visit review using our clinic review tool, if applicable. No additional management support is needed unless otherwise documented below in the visit note. 

## 2015-06-21 NOTE — Progress Notes (Signed)
82 yo with h/o severe depression , osteoporosis, and PD here with her husband and son for follow up.  PD- has been non compliant for years.  Was seeing Dr. Arbutus Leasat but does not follow her recommendations. She is taking Amantadine twice daily.  HTN- deteriorated again.  Checking BP at home- brings in log- ranging 160s- 190s/90-100s.  Taking atenolol 25 mg 1/2 in the morning and 1/4 in the evening  - states that she is compliant with this.     Has not needed to take lasix in almost a year but her husband asks for a refill because she "tends to swell in the summer." No HA, CP or SOB.  Lab Results  Component Value Date   CREATININE 0.85 07/26/2014   Lab Results  Component Value Date   NA 139 07/26/2014   K 4.0 07/26/2014   CL 104 07/26/2014   CO2 31 07/26/2014      Patient Active Problem List   Diagnosis Date Noted  . Osteoporosis 09/27/2014  . Fracture of femoral neck, right (HCC) 04/19/2012  . Leg edema 01/03/2012  . Poor circulation of extremity (HCC) 08/01/2011  . HEARING LOSS, BILATERAL 04/11/2010  . UNSPECIFIED ANEMIA 02/23/2010  . DEPRESSION 10/01/2008  . PARKINSON'S DISEASE 10/01/2008  . Essential hypertension 10/01/2008  . MYOCARDIAL INFARCTION, HX OF 10/01/2008  . INSOMNIA 08/05/2007  . HYPOKALEMIA 04/03/2007   Past Medical History  Diagnosis Date  . Depression     s/p ECT therapy  . Hypertension   . Myocardial infarction (HCC) 1987  . Parkinson's disease   . Fall at home    Past Surgical History  Procedure Laterality Date  . Back surgery  2003 & 01/1989  . Abdominal hysterectomy  1995  . Bladder surgery  09/1988  . Electroconvulsive therapy    . Hip arthroplasty  04/19/2012    Procedure: ARTHROPLASTY BIPOLAR HIP;  Surgeon: Dannielle HuhSteve Lucey, MD;  [facility];  Service: Orthopedics;  Laterality: Right;   Social History  Substance Use Topics  . Smoking status: Never Smoker   . Smokeless tobacco: Never Used  . Alcohol Use: No   Family History  Problem  Relation Age of Onset  . Angina Mother     and MI  . Alcohol abuse Other     brain tumor  . Diabetes Other    No Known Allergies Current Outpatient Prescriptions on File Prior to Visit  Medication Sig Dispense Refill  . alendronate (FOSAMAX) 70 MG tablet Take 1 tablet (70 mg total) by mouth every 7 (seven) days. Take with a full glass of water on an empty stomach. 4 tablet 11  . Amantadine HCl 100 MG tablet TAKE ONE TABLET BY MOUTH TWICE DAILY 180 tablet 0  . atenolol (TENORMIN) 50 MG tablet Take 1/2 tab three times daily (Patient taking differently: 2 (two) times daily. Take 1/2 tab three times daily) 90 tablet 1  . Misc. Devices (TRANSPORT CHAIR) MISC 1 Device by Does not apply route as needed. 1 each 0  . rasagiline (AZILECT) 0.5 MG TABS tablet Take 0.5 mg by mouth daily.    . traZODone (DESYREL) 50 MG tablet TAKE THREE-FOURTHS TABLET BY MOUTH AT BEDTIME 30 tablet 0  . furosemide (LASIX) 20 MG tablet TAKE 1 TAB DAILY, IF NEEDED1 TAB IF WEIGHT GAIN GREATER THAN 2 LBS OR INCREASED LOWER EXREMITY EDEMA (Patient not taking: Reported on 06/21/2015) 60 tablet 5  . [DISCONTINUED] potassium chloride (K-DUR) 10 MEQ tablet TAKE 1 TABLET BY MOUTH EVERY  DAY 30 tablet 3   No current facility-administered medications on file prior to visit.   The PMH, PSH, Social History, Family History, Medications, and allergies have been reviewed in [facility], and have been updated if relevant.  ROS: See HPI    Wt Readings from Last 3 Encounters:  09/27/14 135 lb 4 oz (61.349 kg)  06/02/14 134 lb 8 oz (61.009 kg)  10/29/13 130 lb (58.968 kg)     Physical Exam  BP 178/98 mmHg  Pulse 69  Temp(Src) 97.2 F (36.2 C) (Oral)  Wt   SpO2 95%   BP Readings from Last 3 Encounters:  06/21/15 178/98  09/27/14 140/88  07/26/14 158/88     General: NAD sitting quietly in wheel chair, pleasant  Head: normocephalic, atraumatic, and no abnormalities observed.  Eyes: pupils equal, pupils round, and no injection.   Ears: R ear normal and L ear normal.  Nose: no external deformity.  Mouth: no gingival abnormalities.  Lungs: normal respiratory effort, no intercostal retractions, no accessory muscle use, and normal breath sounds.  Heart: RRR Abdomen: soft, non-tender, normal bowel sounds, and no guarding.  Msk: able to raise both arms above her head slowly  Neurologic: alert & oriented X3,-tremors in both hands at rest Ext: No edema today, chronic venous changes Psych: normally interactive, flat affect, and subdued.

## 2015-06-21 NOTE — Assessment & Plan Note (Signed)
Deteriorated. >25 minutes spent in face to face time with patient, >50% spent in counselling or coordination of care She is refusing to increase atenolol and take it as directed or try any other antihypertensives.  She would like to try to wean off amantadine and continue to monitor her blood pressure.

## 2015-07-31 ENCOUNTER — Other Ambulatory Visit: Payer: Self-pay | Admitting: Family Medicine

## 2015-08-05 NOTE — Telephone Encounter (Addendum)
Lyla SonCarrie said Mr Mallory Perez is on phone and pharmacy did not have refill on amantadine and atenolol. [facility] [place] [facility] [place] and will get rx ready; atenolol will be ordered and will not be ready til Mon. Lyla SonCarrie will let Mr Mallory Perez know.

## 2015-08-10 ENCOUNTER — Other Ambulatory Visit: Payer: Self-pay | Admitting: Family Medicine

## 2015-09-07 DIAGNOSIS — H25813 Combined forms of age-related cataract, bilateral: Secondary | ICD-10-CM | POA: Diagnosis not present

## 2015-09-07 DIAGNOSIS — H524 Presbyopia: Secondary | ICD-10-CM | POA: Diagnosis not present

## 2015-09-07 DIAGNOSIS — H31092 Other chorioretinal scars, left eye: Secondary | ICD-10-CM | POA: Diagnosis not present

## 2015-09-07 DIAGNOSIS — H00035 Abscess of left lower eyelid: Secondary | ICD-10-CM | POA: Diagnosis not present

## 2015-10-06 DIAGNOSIS — H01012 Ulcerative blepharitis right lower eyelid: Secondary | ICD-10-CM | POA: Diagnosis not present

## 2015-10-06 DIAGNOSIS — H01015 Ulcerative blepharitis left lower eyelid: Secondary | ICD-10-CM | POA: Diagnosis not present

## 2015-11-14 ENCOUNTER — Telehealth: Payer: Self-pay

## 2015-11-14 DIAGNOSIS — G20 Parkinson's disease: Secondary | ICD-10-CM

## 2015-11-14 NOTE — Telephone Encounter (Signed)
If he can no longer take care of her, they may need to consider other options, like ALF/SNF.  Home health would not be able to provide all the care she needs.  What services from home health are they looking for?

## 2015-11-14 NOTE — Telephone Encounter (Signed)
Ann DPR signed request home health for pt to have evaluation for home health status; pts husband can no longer take care of pt. Pt last seen 06/21/15. Ann request cb.

## 2015-11-16 NOTE — Telephone Encounter (Signed)
Spoke with Jenel LucksRoberta and she would like you to put in a Home Health referral for [facility] Fall Risk [facility] and a Home Health aide. She is aware that this will be temporary help for her mother. Call [place] when referral is put in. She will make a 30 minute followup soon because she needs to be seen for the Home Health eval.

## 2015-11-16 NOTE — Telephone Encounter (Signed)
PLEASE NOTE: All timestamps contained within this report are represented as [place] Standard Time. CONFIDENTIALTY NOTICE: This fax transmission is intended only for the addressee. It contains information that is legally privileged, confidential or otherwise protected from use or disclosure. If you are not the intended recipient, you are strictly prohibited from reviewing, disclosing, copying using or disseminating any of this information or taking any action in reliance on or regarding this information. If you have received this fax in error, please notify us immediately by telephone so that we can arrange for its return to us. Phone: [phone], Toll-Free: [phone], Fax: [phone] Page: 1 of 1 Call Id: [id number] Mallory Perez, Mallory Perez Contact Type Call Who Is Calling Patient / Member / Family / Caregiver Caller Name Declined to provide Caller Phone Number Declined to provide Call Type Message Only Information Provided Reason for Call Request for General Office Information Initial Comment Caller states called on Mon about home health issue for mother. Hasn't heard back from office. Patient is Mallory Conroynna Pound DOB 09/18/33 or 07/28/1928 (unsure of DOB). Spoke with office and information provided to patient. Additional Comment Call Closed By: Doren CustardBrittany Flanigan Transaction Date/Time: 11/16/2015 9:54:49 AM (ET)

## 2015-11-16 NOTE — Telephone Encounter (Signed)
Referral placed.

## 2015-11-17 NOTE — Telephone Encounter (Signed)
Referral faxed to [facility].

## 2015-11-20 DIAGNOSIS — I739 Peripheral vascular disease, unspecified: Secondary | ICD-10-CM | POA: Diagnosis not present

## 2015-11-20 DIAGNOSIS — I10 Essential (primary) hypertension: Secondary | ICD-10-CM | POA: Diagnosis not present

## 2015-11-20 DIAGNOSIS — G20 Parkinson's disease: Secondary | ICD-10-CM | POA: Diagnosis not present

## 2015-11-20 DIAGNOSIS — I252 Old myocardial infarction: Secondary | ICD-10-CM | POA: Diagnosis not present

## 2015-11-20 DIAGNOSIS — F329 Major depressive disorder, single episode, unspecified: Secondary | ICD-10-CM | POA: Diagnosis not present

## 2015-11-20 DIAGNOSIS — M810 Age-related osteoporosis without current pathological fracture: Secondary | ICD-10-CM | POA: Diagnosis not present

## 2015-11-22 ENCOUNTER — Telehealth: Payer: Self-pay | Admitting: Family Medicine

## 2015-11-22 NOTE — Telephone Encounter (Signed)
Ok to give verbal order as requested. 

## 2015-11-22 NOTE — Telephone Encounter (Signed)
Spoke to [place] and provided verbal orders as requested

## 2015-11-22 NOTE — Telephone Encounter (Signed)
Joni Reiningicole called from [facility] to get a verbal ok to continue to see Ms Mallory Perez for skilled nursing and PT. Please call Joni Reiningicole at [phone]

## 2015-11-29 ENCOUNTER — Telehealth: Payer: Self-pay

## 2015-11-29 DIAGNOSIS — G20 Parkinson's disease: Secondary | ICD-10-CM | POA: Diagnosis not present

## 2015-11-29 DIAGNOSIS — M810 Age-related osteoporosis without current pathological fracture: Secondary | ICD-10-CM | POA: Diagnosis not present

## 2015-11-29 DIAGNOSIS — I739 Peripheral vascular disease, unspecified: Secondary | ICD-10-CM | POA: Diagnosis not present

## 2015-11-29 DIAGNOSIS — I10 Essential (primary) hypertension: Secondary | ICD-10-CM | POA: Diagnosis not present

## 2015-11-29 DIAGNOSIS — I252 Old myocardial infarction: Secondary | ICD-10-CM | POA: Diagnosis not present

## 2015-11-29 DIAGNOSIS — F329 Major depressive disorder, single episode, unspecified: Secondary | ICD-10-CM | POA: Diagnosis not present

## 2015-11-29 NOTE — Telephone Encounter (Signed)
Ok to give verbal orders as requested. 

## 2015-11-29 NOTE — Telephone Encounter (Signed)
ERin Pt with [facility] left v/m requesting verbal order for home health PT  2 x a week for 7 weeks for strengthening, gait training,balance and fall prevention.  Also request verbal order for home health OT to do eval and treat for ADL needs.

## 2015-11-30 NOTE — Telephone Encounter (Signed)
Lm on Mallory Perez's vm and provided verbal orders as requested 

## 2015-12-02 ENCOUNTER — Telehealth: Payer: Self-pay | Admitting: Family Medicine

## 2015-12-02 DIAGNOSIS — G20 Parkinson's disease: Secondary | ICD-10-CM | POA: Diagnosis not present

## 2015-12-02 DIAGNOSIS — I252 Old myocardial infarction: Secondary | ICD-10-CM | POA: Diagnosis not present

## 2015-12-02 DIAGNOSIS — F329 Major depressive disorder, single episode, unspecified: Secondary | ICD-10-CM | POA: Diagnosis not present

## 2015-12-02 DIAGNOSIS — I739 Peripheral vascular disease, unspecified: Secondary | ICD-10-CM | POA: Diagnosis not present

## 2015-12-02 DIAGNOSIS — M810 Age-related osteoporosis without current pathological fracture: Secondary | ICD-10-CM | POA: Diagnosis not present

## 2015-12-02 DIAGNOSIS — I10 Essential (primary) hypertension: Secondary | ICD-10-CM | POA: Diagnosis not present

## 2015-12-02 NOTE — Telephone Encounter (Signed)
[place] CasterAnn Hillman called [facility] let [facility] know [facility] about earlier.  She just wanted us to be aware.

## 2015-12-05 DIAGNOSIS — M810 Age-related osteoporosis without current pathological fracture: Secondary | ICD-10-CM | POA: Diagnosis not present

## 2015-12-05 DIAGNOSIS — I10 Essential (primary) hypertension: Secondary | ICD-10-CM | POA: Diagnosis not present

## 2015-12-05 DIAGNOSIS — G20 Parkinson's disease: Secondary | ICD-10-CM | POA: Diagnosis not present

## 2015-12-05 DIAGNOSIS — I739 Peripheral vascular disease, unspecified: Secondary | ICD-10-CM | POA: Diagnosis not present

## 2015-12-05 DIAGNOSIS — I252 Old myocardial infarction: Secondary | ICD-10-CM | POA: Diagnosis not present

## 2015-12-05 DIAGNOSIS — F329 Major depressive disorder, single episode, unspecified: Secondary | ICD-10-CM | POA: Diagnosis not present

## 2015-12-06 ENCOUNTER — Telehealth: Payer: Self-pay

## 2015-12-06 NOTE — Telephone Encounter (Signed)
Ok to give verbal orders as requested. 

## 2015-12-06 NOTE — Telephone Encounter (Signed)
Mallory Dresseronnie OT with [facility] left v/m requesting verbal orders for home health OT  2 x a week for 4 weeks.

## 2015-12-06 NOTE — Telephone Encounter (Signed)
Lm on Connies vm and provided verbal orders as requested

## 2015-12-07 DIAGNOSIS — I739 Peripheral vascular disease, unspecified: Secondary | ICD-10-CM | POA: Diagnosis not present

## 2015-12-07 DIAGNOSIS — G20 Parkinson's disease: Secondary | ICD-10-CM | POA: Diagnosis not present

## 2015-12-07 DIAGNOSIS — I10 Essential (primary) hypertension: Secondary | ICD-10-CM | POA: Diagnosis not present

## 2015-12-07 DIAGNOSIS — I252 Old myocardial infarction: Secondary | ICD-10-CM | POA: Diagnosis not present

## 2015-12-07 DIAGNOSIS — M810 Age-related osteoporosis without current pathological fracture: Secondary | ICD-10-CM | POA: Diagnosis not present

## 2015-12-07 DIAGNOSIS — F329 Major depressive disorder, single episode, unspecified: Secondary | ICD-10-CM | POA: Diagnosis not present

## 2015-12-08 ENCOUNTER — Encounter: Payer: Self-pay | Admitting: Family Medicine

## 2015-12-08 ENCOUNTER — Ambulatory Visit (INDEPENDENT_AMBULATORY_CARE_PROVIDER_SITE_OTHER): Payer: Medicare Other | Admitting: Family Medicine

## 2015-12-08 VITALS — BP 128/76 | HR 53 | Temp 97.3°F

## 2015-12-08 DIAGNOSIS — R531 Weakness: Secondary | ICD-10-CM

## 2015-12-08 DIAGNOSIS — Z742 Need for assistance at home and no other household member able to render care: Secondary | ICD-10-CM | POA: Insufficient documentation

## 2015-12-08 DIAGNOSIS — G20 Parkinson's disease: Secondary | ICD-10-CM | POA: Diagnosis not present

## 2015-12-08 NOTE — Progress Notes (Signed)
82 yo with h/o severe depression , osteoporosis, and PD here with her husband and son for follow up.  She needs a face to face for home health services she is receiving.   PD- has been non compliant for years.  Was seeing Dr. Arbutus Leas but does not follow her recommendations. She is taking Amantadine twice daily.  She now is wheelchair bound and only her husband is home to care for her.  She has been receiving Home Health- Pt/OT and RN. Husband and son feel this has been very helpful.  She needs help with all ADLs other than feeding.  She is also a significant fall risk.  HTN- improved today. Taking atenolol 25 mg 1/2 in the morning and 1/4 in the evening  - states that she is compliant with this.    No HA, CP or SOB.  Lab Results  Component Value Date   CREATININE 0.85 07/26/2014   Lab Results  Component Value Date   NA 139 07/26/2014   K 4.0 07/26/2014   CL 104 07/26/2014   CO2 31 07/26/2014      Patient Active Problem List  Diagnosis  . HYPOKALEMIA  . UNSPECIFIED ANEMIA  . DEPRESSION  . PARKINSON'S DISEASE  . Essential hypertension  . MYOCARDIAL INFARCTION, HX OF  . INSOMNIA  . HEARING LOSS, BILATERAL  . Poor circulation of extremity (HCC)  . Leg edema  . Fracture of femoral neck, right (HCC)  . Osteoporosis   Past Medical History:  Diagnosis Date  . Depression    s/p ECT therapy  . Fall at home   . Hypertension   . Myocardial infarction (HCC) 1987  . Parkinson's disease    Past Surgical History:  Procedure Laterality Date  . ABDOMINAL HYSTERECTOMY  1995  . BACK SURGERY  2003 & 01/1989  . BLADDER SURGERY  09/1988  . ELECTROCONVULSIVE THERAPY    . HIP ARTHROPLASTY  04/19/2012   Procedure: ARTHROPLASTY BIPOLAR HIP;  Surgeon: Dannielle Huh, MD;  Location: [facility] OR;  Service: Orthopedics;  Laterality: Right;   Social History  Substance Use Topics  . Smoking status: Never Smoker  . Smokeless tobacco: Never Used  . Alcohol use No   Family History  Problem  Relation Age of Onset  . Angina Mother     and MI  . Alcohol abuse Other     brain tumor  . Diabetes Other    No Known Allergies Current Outpatient Prescriptions on File Prior to Visit  Medication Sig Dispense Refill  . alendronate (FOSAMAX) 70 MG tablet Take 1 tablet (70 mg total) by mouth every 7 (seven) days. Take with a full glass of water on an empty stomach. 4 tablet 11  . Amantadine HCl 100 MG tablet TAKE ONE TABLET BY MOUTH TWICE DAILY 180 tablet 1  . atenolol (TENORMIN) 50 MG tablet TAKE ONE-HALF TABLET BY MOUTH THREE TIMES DAILY 45 tablet 5  . Misc. Devices (TRANSPORT CHAIR) MISC 1 Device by Does not apply route as needed. 1 each 0  . furosemide (LASIX) 20 MG tablet TAKE 1 TAB DAILY, IF NEEDED1 TAB IF WEIGHT GAIN GREATER THAN 2 LBS OR INCREASED LOWER EXREMITY EDEMA (Patient not taking: Reported on 12/08/2015) 60 tablet 5  . rasagiline (AZILECT) 0.5 MG TABS tablet Take 0.5 mg by mouth daily.    . [DISCONTINUED] potassium chloride (K-DUR) 10 MEQ tablet TAKE 1 TABLET BY MOUTH EVERY DAY 30 tablet 3   No current facility-administered medications on file prior to visit.  The PMH, PSH, Social History, Family History, Medications, and allergies [facility], and have been updated if relevant.  ROS: See HPI    Wt Readings from Last 3 Encounters:  09/27/14 135 lb 4 oz (61.3 kg)  06/02/14 134 lb 8 oz (61 kg)  10/29/13 130 lb (59 kg)     Physical Exam  BP 128/76   Pulse (!) 53   Temp 97.3 F (36.3 C) (Oral)   SpO2 97%    BP Readings from Last 3 Encounters:  12/08/15 128/76  06/21/15 (!) 178/98  09/27/14 140/88     General: NAD sitting quietly in wheel chair, pleasant  Head: normocephalic, atraumatic, and no abnormalities observed.  Eyes: pupils equal, pupils round, and no injection.  Ears: R ear normal and L ear normal.  Nose: no external deformity.  Mouth: no gingival abnormalities.  Lungs: normal respiratory effort, no intercostal retractions, no  accessory muscle use, and normal breath sounds.  Heart: RRR Abdomen: soft, non-tender, normal bowel sounds, and no guarding.  Neurologic: alert & oriented X3,-tremors in both hands at rest Decreased strength in all four extremities Ext: No edema today, chronic venous changes Psych: normally interactive, flat affect, and subdued.

## 2015-12-08 NOTE — Progress Notes (Signed)
Pre visit review using our clinic review tool, if applicable. No additional management support is needed unless otherwise documented below in the visit note. 

## 2015-12-08 NOTE — Assessment & Plan Note (Signed)
New- Home health care is appropriate with progressive PD and progressive weakness. Continue PT/OT, RN. The patient indicates understanding of these issues and agrees with the plan.

## 2015-12-09 DIAGNOSIS — I252 Old myocardial infarction: Secondary | ICD-10-CM | POA: Diagnosis not present

## 2015-12-09 DIAGNOSIS — I739 Peripheral vascular disease, unspecified: Secondary | ICD-10-CM | POA: Diagnosis not present

## 2015-12-09 DIAGNOSIS — F329 Major depressive disorder, single episode, unspecified: Secondary | ICD-10-CM | POA: Diagnosis not present

## 2015-12-09 DIAGNOSIS — I10 Essential (primary) hypertension: Secondary | ICD-10-CM | POA: Diagnosis not present

## 2015-12-09 DIAGNOSIS — G20 Parkinson's disease: Secondary | ICD-10-CM | POA: Diagnosis not present

## 2015-12-09 DIAGNOSIS — M810 Age-related osteoporosis without current pathological fracture: Secondary | ICD-10-CM | POA: Diagnosis not present

## 2015-12-13 ENCOUNTER — Telehealth: Payer: Self-pay

## 2015-12-13 DIAGNOSIS — I252 Old myocardial infarction: Secondary | ICD-10-CM | POA: Diagnosis not present

## 2015-12-13 DIAGNOSIS — M810 Age-related osteoporosis without current pathological fracture: Secondary | ICD-10-CM | POA: Diagnosis not present

## 2015-12-13 DIAGNOSIS — F329 Major depressive disorder, single episode, unspecified: Secondary | ICD-10-CM | POA: Diagnosis not present

## 2015-12-13 DIAGNOSIS — I10 Essential (primary) hypertension: Secondary | ICD-10-CM | POA: Diagnosis not present

## 2015-12-13 DIAGNOSIS — G20 Parkinson's disease: Secondary | ICD-10-CM | POA: Diagnosis not present

## 2015-12-13 DIAGNOSIS — I739 Peripheral vascular disease, unspecified: Secondary | ICD-10-CM | POA: Diagnosis not present

## 2015-12-13 NOTE — Telephone Encounter (Signed)
Clifton CustardAaron PT with [facility] left v/m; pt had fall on 12/11/15; minor abrasion on lt lower extremity that was dressed by family member; no bruising or other apparent injury. FYI to Dr Dayton MartesAron. Per immunization list tdap 04/19/2012.

## 2015-12-13 NOTE — Telephone Encounter (Signed)
Noted! Thank you

## 2015-12-14 DIAGNOSIS — I10 Essential (primary) hypertension: Secondary | ICD-10-CM | POA: Diagnosis not present

## 2015-12-14 DIAGNOSIS — M810 Age-related osteoporosis without current pathological fracture: Secondary | ICD-10-CM | POA: Diagnosis not present

## 2015-12-14 DIAGNOSIS — F329 Major depressive disorder, single episode, unspecified: Secondary | ICD-10-CM | POA: Diagnosis not present

## 2015-12-14 DIAGNOSIS — I252 Old myocardial infarction: Secondary | ICD-10-CM | POA: Diagnosis not present

## 2015-12-14 DIAGNOSIS — G20 Parkinson's disease: Secondary | ICD-10-CM | POA: Diagnosis not present

## 2015-12-14 DIAGNOSIS — I739 Peripheral vascular disease, unspecified: Secondary | ICD-10-CM | POA: Diagnosis not present

## 2015-12-15 DIAGNOSIS — G20 Parkinson's disease: Secondary | ICD-10-CM | POA: Diagnosis not present

## 2015-12-15 DIAGNOSIS — I739 Peripheral vascular disease, unspecified: Secondary | ICD-10-CM | POA: Diagnosis not present

## 2015-12-15 DIAGNOSIS — I10 Essential (primary) hypertension: Secondary | ICD-10-CM | POA: Diagnosis not present

## 2015-12-15 DIAGNOSIS — M810 Age-related osteoporosis without current pathological fracture: Secondary | ICD-10-CM | POA: Diagnosis not present

## 2015-12-15 DIAGNOSIS — F329 Major depressive disorder, single episode, unspecified: Secondary | ICD-10-CM | POA: Diagnosis not present

## 2015-12-15 DIAGNOSIS — I252 Old myocardial infarction: Secondary | ICD-10-CM | POA: Diagnosis not present

## 2015-12-16 DIAGNOSIS — I739 Peripheral vascular disease, unspecified: Secondary | ICD-10-CM | POA: Diagnosis not present

## 2015-12-16 DIAGNOSIS — G20 Parkinson's disease: Secondary | ICD-10-CM | POA: Diagnosis not present

## 2015-12-16 DIAGNOSIS — M810 Age-related osteoporosis without current pathological fracture: Secondary | ICD-10-CM | POA: Diagnosis not present

## 2015-12-16 DIAGNOSIS — I10 Essential (primary) hypertension: Secondary | ICD-10-CM | POA: Diagnosis not present

## 2015-12-16 DIAGNOSIS — I252 Old myocardial infarction: Secondary | ICD-10-CM | POA: Diagnosis not present

## 2015-12-16 DIAGNOSIS — F329 Major depressive disorder, single episode, unspecified: Secondary | ICD-10-CM | POA: Diagnosis not present

## 2015-12-20 DIAGNOSIS — F329 Major depressive disorder, single episode, unspecified: Secondary | ICD-10-CM | POA: Diagnosis not present

## 2015-12-20 DIAGNOSIS — I10 Essential (primary) hypertension: Secondary | ICD-10-CM | POA: Diagnosis not present

## 2015-12-20 DIAGNOSIS — M810 Age-related osteoporosis without current pathological fracture: Secondary | ICD-10-CM | POA: Diagnosis not present

## 2015-12-20 DIAGNOSIS — I252 Old myocardial infarction: Secondary | ICD-10-CM | POA: Diagnosis not present

## 2015-12-20 DIAGNOSIS — G20 Parkinson's disease: Secondary | ICD-10-CM | POA: Diagnosis not present

## 2015-12-20 DIAGNOSIS — I739 Peripheral vascular disease, unspecified: Secondary | ICD-10-CM | POA: Diagnosis not present

## 2015-12-21 DIAGNOSIS — I252 Old myocardial infarction: Secondary | ICD-10-CM | POA: Diagnosis not present

## 2015-12-21 DIAGNOSIS — I739 Peripheral vascular disease, unspecified: Secondary | ICD-10-CM | POA: Diagnosis not present

## 2015-12-21 DIAGNOSIS — I10 Essential (primary) hypertension: Secondary | ICD-10-CM | POA: Diagnosis not present

## 2015-12-21 DIAGNOSIS — F329 Major depressive disorder, single episode, unspecified: Secondary | ICD-10-CM | POA: Diagnosis not present

## 2015-12-21 DIAGNOSIS — M810 Age-related osteoporosis without current pathological fracture: Secondary | ICD-10-CM | POA: Diagnosis not present

## 2015-12-21 DIAGNOSIS — G20 Parkinson's disease: Secondary | ICD-10-CM | POA: Diagnosis not present

## 2015-12-22 DIAGNOSIS — M810 Age-related osteoporosis without current pathological fracture: Secondary | ICD-10-CM | POA: Diagnosis not present

## 2015-12-22 DIAGNOSIS — I739 Peripheral vascular disease, unspecified: Secondary | ICD-10-CM | POA: Diagnosis not present

## 2015-12-22 DIAGNOSIS — F329 Major depressive disorder, single episode, unspecified: Secondary | ICD-10-CM | POA: Diagnosis not present

## 2015-12-22 DIAGNOSIS — I252 Old myocardial infarction: Secondary | ICD-10-CM | POA: Diagnosis not present

## 2015-12-22 DIAGNOSIS — G20 Parkinson's disease: Secondary | ICD-10-CM | POA: Diagnosis not present

## 2015-12-22 DIAGNOSIS — I10 Essential (primary) hypertension: Secondary | ICD-10-CM | POA: Diagnosis not present

## 2015-12-23 DIAGNOSIS — I739 Peripheral vascular disease, unspecified: Secondary | ICD-10-CM | POA: Diagnosis not present

## 2015-12-23 DIAGNOSIS — F329 Major depressive disorder, single episode, unspecified: Secondary | ICD-10-CM | POA: Diagnosis not present

## 2015-12-23 DIAGNOSIS — I10 Essential (primary) hypertension: Secondary | ICD-10-CM | POA: Diagnosis not present

## 2015-12-23 DIAGNOSIS — G20 Parkinson's disease: Secondary | ICD-10-CM | POA: Diagnosis not present

## 2015-12-23 DIAGNOSIS — I252 Old myocardial infarction: Secondary | ICD-10-CM | POA: Diagnosis not present

## 2015-12-23 DIAGNOSIS — M810 Age-related osteoporosis without current pathological fracture: Secondary | ICD-10-CM | POA: Diagnosis not present

## 2015-12-27 DIAGNOSIS — I10 Essential (primary) hypertension: Secondary | ICD-10-CM | POA: Diagnosis not present

## 2015-12-27 DIAGNOSIS — M810 Age-related osteoporosis without current pathological fracture: Secondary | ICD-10-CM | POA: Diagnosis not present

## 2015-12-27 DIAGNOSIS — F329 Major depressive disorder, single episode, unspecified: Secondary | ICD-10-CM | POA: Diagnosis not present

## 2015-12-27 DIAGNOSIS — I252 Old myocardial infarction: Secondary | ICD-10-CM | POA: Diagnosis not present

## 2015-12-27 DIAGNOSIS — I739 Peripheral vascular disease, unspecified: Secondary | ICD-10-CM | POA: Diagnosis not present

## 2015-12-27 DIAGNOSIS — G20 Parkinson's disease: Secondary | ICD-10-CM | POA: Diagnosis not present

## 2015-12-28 DIAGNOSIS — M810 Age-related osteoporosis without current pathological fracture: Secondary | ICD-10-CM | POA: Diagnosis not present

## 2015-12-28 DIAGNOSIS — I10 Essential (primary) hypertension: Secondary | ICD-10-CM | POA: Diagnosis not present

## 2015-12-28 DIAGNOSIS — I252 Old myocardial infarction: Secondary | ICD-10-CM | POA: Diagnosis not present

## 2015-12-28 DIAGNOSIS — I739 Peripheral vascular disease, unspecified: Secondary | ICD-10-CM | POA: Diagnosis not present

## 2015-12-28 DIAGNOSIS — G20 Parkinson's disease: Secondary | ICD-10-CM | POA: Diagnosis not present

## 2015-12-28 DIAGNOSIS — F329 Major depressive disorder, single episode, unspecified: Secondary | ICD-10-CM | POA: Diagnosis not present

## 2015-12-29 DIAGNOSIS — I739 Peripheral vascular disease, unspecified: Secondary | ICD-10-CM | POA: Diagnosis not present

## 2015-12-29 DIAGNOSIS — I10 Essential (primary) hypertension: Secondary | ICD-10-CM | POA: Diagnosis not present

## 2015-12-29 DIAGNOSIS — I252 Old myocardial infarction: Secondary | ICD-10-CM | POA: Diagnosis not present

## 2015-12-29 DIAGNOSIS — M810 Age-related osteoporosis without current pathological fracture: Secondary | ICD-10-CM | POA: Diagnosis not present

## 2015-12-29 DIAGNOSIS — G20 Parkinson's disease: Secondary | ICD-10-CM | POA: Diagnosis not present

## 2015-12-29 DIAGNOSIS — F329 Major depressive disorder, single episode, unspecified: Secondary | ICD-10-CM | POA: Diagnosis not present

## 2015-12-30 DIAGNOSIS — I10 Essential (primary) hypertension: Secondary | ICD-10-CM | POA: Diagnosis not present

## 2015-12-30 DIAGNOSIS — G20 Parkinson's disease: Secondary | ICD-10-CM | POA: Diagnosis not present

## 2015-12-30 DIAGNOSIS — I252 Old myocardial infarction: Secondary | ICD-10-CM | POA: Diagnosis not present

## 2015-12-30 DIAGNOSIS — I739 Peripheral vascular disease, unspecified: Secondary | ICD-10-CM | POA: Diagnosis not present

## 2015-12-30 DIAGNOSIS — F329 Major depressive disorder, single episode, unspecified: Secondary | ICD-10-CM | POA: Diagnosis not present

## 2015-12-30 DIAGNOSIS — M810 Age-related osteoporosis without current pathological fracture: Secondary | ICD-10-CM | POA: Diagnosis not present

## 2016-01-03 DIAGNOSIS — I739 Peripheral vascular disease, unspecified: Secondary | ICD-10-CM | POA: Diagnosis not present

## 2016-01-03 DIAGNOSIS — I252 Old myocardial infarction: Secondary | ICD-10-CM | POA: Diagnosis not present

## 2016-01-03 DIAGNOSIS — G20 Parkinson's disease: Secondary | ICD-10-CM | POA: Diagnosis not present

## 2016-01-03 DIAGNOSIS — F329 Major depressive disorder, single episode, unspecified: Secondary | ICD-10-CM | POA: Diagnosis not present

## 2016-01-03 DIAGNOSIS — I10 Essential (primary) hypertension: Secondary | ICD-10-CM | POA: Diagnosis not present

## 2016-01-03 DIAGNOSIS — M810 Age-related osteoporosis without current pathological fracture: Secondary | ICD-10-CM | POA: Diagnosis not present

## 2016-01-04 DIAGNOSIS — M810 Age-related osteoporosis without current pathological fracture: Secondary | ICD-10-CM | POA: Diagnosis not present

## 2016-01-04 DIAGNOSIS — I739 Peripheral vascular disease, unspecified: Secondary | ICD-10-CM | POA: Diagnosis not present

## 2016-01-04 DIAGNOSIS — F329 Major depressive disorder, single episode, unspecified: Secondary | ICD-10-CM | POA: Diagnosis not present

## 2016-01-04 DIAGNOSIS — G20 Parkinson's disease: Secondary | ICD-10-CM | POA: Diagnosis not present

## 2016-01-04 DIAGNOSIS — I10 Essential (primary) hypertension: Secondary | ICD-10-CM | POA: Diagnosis not present

## 2016-01-04 DIAGNOSIS — I252 Old myocardial infarction: Secondary | ICD-10-CM | POA: Diagnosis not present

## 2016-01-05 DIAGNOSIS — I10 Essential (primary) hypertension: Secondary | ICD-10-CM | POA: Diagnosis not present

## 2016-01-05 DIAGNOSIS — I252 Old myocardial infarction: Secondary | ICD-10-CM | POA: Diagnosis not present

## 2016-01-05 DIAGNOSIS — I739 Peripheral vascular disease, unspecified: Secondary | ICD-10-CM | POA: Diagnosis not present

## 2016-01-05 DIAGNOSIS — F329 Major depressive disorder, single episode, unspecified: Secondary | ICD-10-CM | POA: Diagnosis not present

## 2016-01-05 DIAGNOSIS — G20 Parkinson's disease: Secondary | ICD-10-CM | POA: Diagnosis not present

## 2016-01-05 DIAGNOSIS — M810 Age-related osteoporosis without current pathological fracture: Secondary | ICD-10-CM | POA: Diagnosis not present

## 2016-01-10 DIAGNOSIS — G20 Parkinson's disease: Secondary | ICD-10-CM | POA: Diagnosis not present

## 2016-01-10 DIAGNOSIS — I252 Old myocardial infarction: Secondary | ICD-10-CM | POA: Diagnosis not present

## 2016-01-10 DIAGNOSIS — F329 Major depressive disorder, single episode, unspecified: Secondary | ICD-10-CM | POA: Diagnosis not present

## 2016-01-10 DIAGNOSIS — I10 Essential (primary) hypertension: Secondary | ICD-10-CM | POA: Diagnosis not present

## 2016-01-10 DIAGNOSIS — I739 Peripheral vascular disease, unspecified: Secondary | ICD-10-CM | POA: Diagnosis not present

## 2016-01-10 DIAGNOSIS — M810 Age-related osteoporosis without current pathological fracture: Secondary | ICD-10-CM | POA: Diagnosis not present

## 2016-01-12 ENCOUNTER — Telehealth: Payer: Self-pay

## 2016-01-12 DIAGNOSIS — I252 Old myocardial infarction: Secondary | ICD-10-CM | POA: Diagnosis not present

## 2016-01-12 DIAGNOSIS — F329 Major depressive disorder, single episode, unspecified: Secondary | ICD-10-CM | POA: Diagnosis not present

## 2016-01-12 DIAGNOSIS — M810 Age-related osteoporosis without current pathological fracture: Secondary | ICD-10-CM | POA: Diagnosis not present

## 2016-01-12 DIAGNOSIS — I10 Essential (primary) hypertension: Secondary | ICD-10-CM | POA: Diagnosis not present

## 2016-01-12 DIAGNOSIS — I739 Peripheral vascular disease, unspecified: Secondary | ICD-10-CM | POA: Diagnosis not present

## 2016-01-12 DIAGNOSIS — G20 Parkinson's disease: Secondary | ICD-10-CM | POA: Diagnosis not present

## 2016-01-12 NOTE — Telephone Encounter (Signed)
Ann Pt with [facility] left v/m requesting printed order for standard walker faxed to [facility]. Fax # [phone]. Pt seen 12/08/15 for face to face visit.

## 2016-01-26 NOTE — Telephone Encounter (Signed)
I am not sure if this was done.  Order written again and in my box.

## 2016-01-26 NOTE — Telephone Encounter (Signed)
Order faxed as requested

## 2016-04-09 ENCOUNTER — Ambulatory Visit (INDEPENDENT_AMBULATORY_CARE_PROVIDER_SITE_OTHER): Payer: Medicare Other | Admitting: Family Medicine

## 2016-04-09 ENCOUNTER — Encounter: Payer: Self-pay | Admitting: Family Medicine

## 2016-04-09 DIAGNOSIS — G20 Parkinson's disease: Secondary | ICD-10-CM

## 2016-04-09 MED ORDER — AMANTADINE HCL 100 MG PO TABS: 50.0000 mg | ORAL_TABLET | Freq: Two times a day (BID) | ORAL | Status: DC

## 2016-04-09 MED ORDER — ATENOLOL 50 MG PO TABS: ORAL_TABLET | ORAL | Status: DC

## 2016-04-09 NOTE — Progress Notes (Signed)
Pre visit review using our clinic review tool, if applicable. No additional management support is needed unless otherwise documented below in the visit note. 

## 2016-04-09 NOTE — Assessment & Plan Note (Signed)
HH RN was out to the house prev, about 3 months ago.  Pt was reportedly told she had something was in her throat, unclear [facility].  I'll defer that to PCP.    Unclear if patient ever took azilect.  It was still on med list today.  Patient wasn't taking med, so med list corrected.  I'll defer to PCP.    I suspect she has worsening parkinsonism with voice changes.  I'll defer long term mgmt of parkinsonism to PCP.  I didn't seen any acute abnormality in the OP of the patient.  No stridor.

## 2016-04-09 NOTE — Patient Instructions (Signed)
Don't change your meds for now.  I'll notify Dr. Dayton MartesAron and then I'll defer to her.   Take care.  Glad to see you.

## 2016-04-09 NOTE — Progress Notes (Signed)
HH RN was out to the house prev, about 3 months ago.  Pt was reportedly told she had something was in her throat, unclear [facility].  I'll defer that to PCP.    Unclear if patient ever took azilect.  It was still on med list today.  Patient wasn't taking med, so med list corrected.  I'll defer to PCP.    Per patient, "I can't talk."  Had GI illness with vomiting about 2 weeks ago, but some voice change before that.  Can't speak as loudly or as quickly as prev.  Swallowing is still okay, not choking.  Not coughing after eating.  Family has reported more hallucinations recently.    Meds, vitals, and allergies reviewed.   ROS: Per HPI unless specifically indicated in ROS section   Nad, chronically ill appearing.   Leaning over in wheelchair at baseline, hand tremor slightly worse than baseline today per patient and family.   OP wnl, MMM Neck supple, no stridor rrr ctab

## 2016-05-14 ENCOUNTER — Other Ambulatory Visit: Payer: Self-pay | Admitting: Family Medicine

## 2016-06-04 ENCOUNTER — Other Ambulatory Visit: Payer: Self-pay | Admitting: Family Medicine

## 2016-07-11 DIAGNOSIS — M79651 Pain in right thigh: Secondary | ICD-10-CM | POA: Diagnosis not present

## 2016-07-11 DIAGNOSIS — M1612 Unilateral primary osteoarthritis, left hip: Secondary | ICD-10-CM | POA: Diagnosis not present

## 2016-07-11 DIAGNOSIS — Z9889 Other specified postprocedural states: Secondary | ICD-10-CM | POA: Diagnosis not present

## 2016-07-11 DIAGNOSIS — Z471 Aftercare following joint replacement surgery: Secondary | ICD-10-CM | POA: Diagnosis not present

## 2016-07-11 DIAGNOSIS — M1711 Unilateral primary osteoarthritis, right knee: Secondary | ICD-10-CM | POA: Diagnosis not present

## 2016-07-11 DIAGNOSIS — R251 Tremor, unspecified: Secondary | ICD-10-CM | POA: Diagnosis not present

## 2016-07-11 DIAGNOSIS — R2681 Unsteadiness on feet: Secondary | ICD-10-CM | POA: Diagnosis not present

## 2016-07-11 DIAGNOSIS — Z8781 Personal history of (healed) traumatic fracture: Secondary | ICD-10-CM | POA: Diagnosis not present

## 2016-07-11 DIAGNOSIS — Z96641 Presence of right artificial hip joint: Secondary | ICD-10-CM | POA: Diagnosis not present

## 2016-07-11 DIAGNOSIS — M85851 Other specified disorders of bone density and structure, right thigh: Secondary | ICD-10-CM | POA: Diagnosis not present

## 2016-07-11 DIAGNOSIS — M25551 Pain in right hip: Secondary | ICD-10-CM | POA: Diagnosis not present

## 2016-07-25 ENCOUNTER — Emergency Department (HOSPITAL_COMMUNITY)
Admission: EM | Admit: 2016-07-25 | Discharge: 2016-07-25 | Disposition: A | Discharge status: Home / Self Care | Payer: Medicare Other | Attending: Emergency Medicine | Admitting: Emergency Medicine

## 2016-07-25 ENCOUNTER — Encounter (HOSPITAL_COMMUNITY): Payer: Self-pay | Admitting: *Deleted

## 2016-07-25 ENCOUNTER — Ambulatory Visit (INDEPENDENT_AMBULATORY_CARE_PROVIDER_SITE_OTHER): Payer: Medicare Other | Admitting: Family Medicine

## 2016-07-25 ENCOUNTER — Emergency Department (HOSPITAL_COMMUNITY): Payer: Medicare Other

## 2016-07-25 DIAGNOSIS — I499 Cardiac arrhythmia, unspecified: Secondary | ICD-10-CM | POA: Insufficient documentation

## 2016-07-25 DIAGNOSIS — Z7982 Long term (current) use of aspirin: Secondary | ICD-10-CM | POA: Insufficient documentation

## 2016-07-25 DIAGNOSIS — R609 Edema, unspecified: Secondary | ICD-10-CM

## 2016-07-25 DIAGNOSIS — R600 Localized edema: Secondary | ICD-10-CM | POA: Diagnosis not present

## 2016-07-25 DIAGNOSIS — I252 Old myocardial infarction: Secondary | ICD-10-CM | POA: Diagnosis not present

## 2016-07-25 DIAGNOSIS — G20 Parkinson's disease: Secondary | ICD-10-CM | POA: Diagnosis not present

## 2016-07-25 DIAGNOSIS — I10 Essential (primary) hypertension: Secondary | ICD-10-CM | POA: Diagnosis not present

## 2016-07-25 DIAGNOSIS — R9431 Abnormal electrocardiogram [ECG] [EKG]: Secondary | ICD-10-CM | POA: Diagnosis not present

## 2016-07-25 DIAGNOSIS — Z79899 Other long term (current) drug therapy: Secondary | ICD-10-CM | POA: Insufficient documentation

## 2016-07-25 DIAGNOSIS — J9811 Atelectasis: Secondary | ICD-10-CM | POA: Diagnosis not present

## 2016-07-25 LAB — HEPATIC FUNCTION PANEL
ALK PHOS: 86 U/L (ref 38–126)
ALT: 18 U/L (ref 14–54)
AST: 22 U/L (ref 15–41)
Albumin: 3.7 g/dL (ref 3.5–5.0)
BILIRUBIN DIRECT: 0.3 mg/dL (ref 0.1–0.5)
BILIRUBIN INDIRECT: 0.3 mg/dL (ref 0.3–0.9)
BILIRUBIN TOTAL: 0.6 mg/dL (ref 0.3–1.2)
Total Protein: 6.2 g/dL — ABNORMAL LOW (ref 6.5–8.1)

## 2016-07-25 LAB — CBC
HEMATOCRIT: 52 % — AB (ref 36.0–46.0)
Hemoglobin: 17.2 g/dL — ABNORMAL HIGH (ref 12.0–15.0)
MCH: 29.9 pg (ref 26.0–34.0)
MCHC: 33.1 g/dL (ref 30.0–36.0)
MCV: 90.4 fL (ref 78.0–100.0)
PLATELETS: 145 10*3/uL — AB (ref 150–400)
RBC: 5.75 MIL/uL — ABNORMAL HIGH (ref 3.87–5.11)
RDW: 13.5 % (ref 11.5–15.5)
WBC: 6.3 10*3/uL (ref 4.0–10.5)

## 2016-07-25 LAB — BASIC METABOLIC PANEL
Anion gap: 10 (ref 5–15)
BUN: 19 mg/dL (ref 6–20)
CHLORIDE: 105 mmol/L (ref 101–111)
CO2: 24 mmol/L (ref 22–32)
CREATININE: 0.9 mg/dL (ref 0.44–1.00)
Calcium: 9.3 mg/dL (ref 8.9–10.3)
GFR calc Af Amer: >60 mL/min (ref >60)
GFR calc non Af Amer: 58 mL/min — ABNORMAL LOW (ref >60)
GLUCOSE: 87 mg/dL (ref 65–99)
POTASSIUM: 3.8 mmol/L (ref 3.5–5.1)
Sodium: 139 mmol/L (ref 135–145)

## 2016-07-25 LAB — I-STAT TROPONIN, ED: Troponin i, poc: 0.01 ng/mL (ref 0.00–0.08)

## 2016-07-25 LAB — TSH: TSH: 5.517 u[IU]/mL — ABNORMAL HIGH (ref 0.350–4.500)

## 2016-07-25 LAB — BRAIN NATRIURETIC PEPTIDE: B Natriuretic Peptide: 118.6 pg/mL — ABNORMAL HIGH (ref 0.0–100.0)

## 2016-07-25 MED ORDER — POTASSIUM CHLORIDE ER 10 MEQ PO TBCR: 20.0000 meq | EXTENDED_RELEASE_TABLET | Freq: Every day | ORAL | 0 refills | Status: DC

## 2016-07-25 NOTE — ED Notes (Signed)
EDP aware of pt HR.  

## 2016-07-25 NOTE — Progress Notes (Signed)
Subjective:   Patient ID: Mallory Perez, female    DOB: Jul 30, 1931, 83 y.o.   MRN: [id number]  Mallory Perez is a pleasant 83 y.o. year old female with PD, who presents to clinic today with her husband and son for Leg Swelling (Started last week. She took some furosemide.) and Elevated BP (Has been elevating more lately.)  on 07/25/2016  HPI:  I have not seen patient since 11/2015.  At that time, she and her husband had been adjusting her BP medications- taking them as she felt she needed them.  Taking atenolol 25 mg 1/2 in the morning and 1/4 in the evening with as needed lasix.  Also taking Kdur.  Lately, her BP has been running even higher. Legs were also more swollen since last week.  She did take an extra dose of lasix for this.  She is wheelchair bound.  PD is progressive.  Has not been compliant with rxs.  Current Outpatient Prescriptions on File Prior to Visit  Medication Sig Dispense Refill  . Amantadine HCl 100 MG tablet Take 0.5 tablets (50 mg total) by mouth 2 (two) times daily.    . Aspirin (ASPIR-81 PO) Take by mouth as needed.    Marland Kitchen atenolol (TENORMIN) 50 MG tablet TAKE ONE-HALF TABLET BY MOUTH TWO TIMES DAILY    . furosemide (LASIX) 20 MG tablet TAKE 1 TAB DAILY, IF NEEDED1 TAB IF WEIGHT GAIN GREATER THAN 2 LBS OR INCREASED LOWER EXREMITY EDEMA 60 tablet 5  . senna (SENOKOT) 8.6 MG TABS tablet Take 1 tablet by mouth 2 (two) times daily.    . traZODone (DESYREL) 50 MG tablet Take 1/4 by mouth at bedtime    . [DISCONTINUED] potassium chloride (K-DUR) 10 MEQ tablet TAKE 1 TABLET BY MOUTH EVERY DAY 30 tablet 3   No current facility-administered medications on file prior to visit.     No Known Allergies  Past Medical History:  Diagnosis Date  . Depression    s/p ECT therapy  . Fall at home   . Hypertension   . Myocardial infarction 1987  . Parkinson's disease     Past Surgical History:  Procedure Laterality Date  . ABDOMINAL HYSTERECTOMY  1995  . BACK SURGERY   2003 & 01/1989  . BLADDER SURGERY  09/1988  . ELECTROCONVULSIVE THERAPY    . HIP ARTHROPLASTY  04/19/2012   Procedure: ARTHROPLASTY BIPOLAR HIP;  Surgeon: Dannielle Huh, MD;  Location: [facility] OR;  Service: Orthopedics;  Laterality: Right;    Family History  Problem Relation Age of Onset  . Angina Mother     and MI  . Alcohol abuse Other     brain tumor  . Diabetes Other     Social History   Social History  . Marital status: Married    Spouse name: N/A  . Number of children: N/A  . Years of education: N/A   Occupational History  . Not on file.   Social History Main Topics  . Smoking status: Never Smoker  . Smokeless tobacco: Never Used  . Alcohol use No  . Drug use: No  . Sexual activity: Not on file   Other Topics Concern  . Not on file   Social History Narrative  . No narrative on file   The PMH, PSH, Social History, Family History, Medications, and allergies have been reviewed in [facility], and have been updated if relevant.   Review of Systems  Constitutional: Negative.   Respiratory: Positive for shortness of  breath.   Cardiovascular: Positive for chest pain and leg swelling.  Neurological: Negative.   All other systems reviewed and are negative.      Objective:    BP (!) 186/94 (BP Location: Left Arm, Patient Position: Sitting, Cuff Size: Large)   Pulse 67 Comment: irregular  Wt 131 lb (59.4 kg)   SpO2 96%   BMI 22.49 kg/m   Wt Readings from Last 3 Encounters:  07/25/16 131 lb (59.4 kg)  09/27/14 135 lb 4 oz (61.3 kg)  06/02/14 134 lb 8 oz (61 kg)    Physical Exam  Constitutional: She is oriented to person, place, and time.  Chronically ill appearing, wheelchair bound  Cardiovascular: An irregularly irregular rhythm present.  Pulmonary/Chest: Effort normal.  Musculoskeletal: She exhibits edema.  Venous stasis  Neurological: She is alert and oriented to person, place, and time.  Psychiatric: She has a normal mood and affect. Her behavior is normal.  Judgment and thought content normal.  Nursing note and vitals reviewed.         Assessment & Plan:   Irregular cardiac rhythm - Plan: EKG 12-Lead No Follow-up on file.

## 2016-07-25 NOTE — ED Notes (Signed)
ED Provider at bedside. 

## 2016-07-25 NOTE — ED Triage Notes (Signed)
Pt presents with c/o abnormal EKG from her PCP office- [facility]. Family scheduled an appointment for her in the office today for evaluation of leg swelling over the past week. She was found to be in irregular rhythm- atrial fib/flutter on EKG and referred to ed for further evaluation and management. She also has a skin tear to LLE obtained while family was transferring her to car today

## 2016-07-25 NOTE — ED Provider Notes (Signed)
[facility]Y DEPT Provider Note   CSN: [id number] Arrival date & time: 07/25/16  1140     History   Chief Complaint Chief Complaint  Patient presents with  . Abnormal ECG    HPI Mallory Perez is a 83 y.o. female.  HPI    Mallory Perez is a 83 y.o. female, with a history of HTN and Parkinson disease, presenting to the ED with An abnormal EKG. Patient went to her PCP office for complaint of bilateral lower extremity swelling over the past 3-4 days. While there she was found to be in an irregular rhythm atrial fib/flutter with a tachycardic rate. No known history of these arrhythmias. Patient is accompanied by her husband, Reita Cliche, and her son, Genevie Cheshire. Patient also complains of a skin tear wound to the left lower leg that was sustained today while her family was loading her into the car. Patient denies chest pain, shortness of breath, nausea/vomiting, fever, palpitations, or any other complaints.    Past Medical History:  Diagnosis Date  . Depression    s/p ECT therapy  . Fall at home   . Hypertension   . Myocardial infarction (HCC) 1987  . Parkinson's disease     Patient Active Problem List   Diagnosis Date Noted  . Irregular cardiac rhythm 07/25/2016  . Need for home health care 12/08/2015  . Weakness 12/08/2015  . Osteoporosis 09/27/2014  . Fracture of femoral neck, right (HCC) 04/19/2012  . Leg edema 01/03/2012  . Poor circulation of extremity 08/01/2011  . HEARING LOSS, BILATERAL 04/11/2010  . UNSPECIFIED ANEMIA 02/23/2010  . DEPRESSION 10/01/2008  . PARKINSON'S DISEASE 10/01/2008  . Essential hypertension 10/01/2008  . MYOCARDIAL INFARCTION, HX OF 10/01/2008  . INSOMNIA 08/05/2007  . HYPOKALEMIA 04/03/2007    Past Surgical History:  Procedure Laterality Date  . ABDOMINAL HYSTERECTOMY  1995  . BACK SURGERY  2003 & 01/1989  . BLADDER SURGERY  09/1988  . ELECTROCONVULSIVE THERAPY    . HIP ARTHROPLASTY  04/19/2012   Procedure: ARTHROPLASTY BIPOLAR HIP;   Surgeon: Dannielle Huh, MD;  Location: [facility] OR;  Service: Orthopedics;  Laterality: Right;    OB History    No data available       Home Medications    Prior to Admission medications   Medication Sig Start Date End Date Taking? Authorizing Provider  Amantadine HCl 100 MG tablet Take 0.5 tablets (50 mg total) by mouth 2 (two) times daily. 04/09/16  Yes Joaquim Nam, MD  aspirin 81 MG chewable tablet Chew 81 mg by mouth daily as needed for mild pain.   Yes [provider]  atenolol (TENORMIN) 50 MG tablet TAKE ONE-HALF TABLET BY MOUTH TWO TIMES DAILY 04/09/16  Yes Joaquim Nam, MD  furosemide (LASIX) 20 MG tablet TAKE 1 TAB DAILY, IF NEEDED1 TAB IF WEIGHT GAIN GREATER THAN 2 LBS OR INCREASED LOWER EXREMITY EDEMA 07/26/14  Yes Dianne Dun, MD  senna (SENOKOT) 8.6 MG TABS tablet Take 1 tablet by mouth 2 (two) times daily.   Yes [provider]  traZODone (DESYREL) 50 MG tablet Take 1/4 by mouth at bedtime   Yes [provider]  potassium chloride (K-DUR) 10 MEQ tablet Take 2 tablets (20 mEq total) by mouth daily. 07/25/16 08/24/16  Anselm Pancoast, PA-C    Family History Family History  Problem Relation Age of Onset  . Angina Mother     and MI  . Alcohol abuse Other     brain tumor  .  Diabetes Other     Social History Social History  Substance Use Topics  . Smoking status: Never Smoker  . Smokeless tobacco: Never Used  . Alcohol use No     Allergies   Calcium   Review of Systems Review of Systems  Constitutional: Negative for chills, diaphoresis and fever.  Respiratory: Negative for cough and shortness of breath.   Cardiovascular: Negative for chest pain.  All other systems reviewed and are negative.    Physical Exam Updated Vital Signs BP (!) 171/94 (BP Location: Right Arm)   Pulse (!) 57   Temp 98.7 F (37.1 C) (Oral)   Resp 18   SpO2 97%   Physical Exam  Constitutional: She appears well-developed and well-nourished. No distress.    HENT:  Head: Normocephalic and atraumatic.  Eyes: Conjunctivae are normal.  Neck: Neck supple.  Cardiovascular: Normal rate, normal heart sounds and intact distal pulses.   Patient's rhythm on the monitor is tachycardic and irregularly irregular, however, palpable pulse is around 60-70 bpm and regular.  Pulmonary/Chest: Effort normal and breath sounds normal. No respiratory distress.  Abdominal: Soft. There is no tenderness. There is no guarding.  Musculoskeletal: She exhibits edema.  Significant, bilateral lower extremity edema extending to the knees. Nontender to the touch.  Lymphadenopathy:    She has no cervical adenopathy.  Neurological: She is alert.  Skin: Skin is warm and dry. Capillary refill takes less than 2 seconds. She is not diaphoretic.  Large skin tear to the left lateral calf with central 2 cm V-shaped deep laceration.  Psychiatric: She has a normal mood and affect. Her behavior is normal.  Nursing note and vitals reviewed.        ED Treatments / Results  Labs (all labs ordered are listed, but only abnormal results are displayed) Labs Reviewed  BASIC METABOLIC PANEL - Abnormal; Notable for the following:       Result Value   GFR calc non Af Amer 58 (*)    All other components within normal limits  CBC - Abnormal; Notable for the following:    RBC 5.75 (*)    Hemoglobin 17.2 (*)    HCT 52.0 (*)    Platelets 145 (*)    All other components within normal limits  HEPATIC FUNCTION PANEL - Abnormal; Notable for the following:    Total Protein 6.2 (*)    All other components within normal limits  BRAIN NATRIURETIC PEPTIDE - Abnormal; Notable for the following:    B Natriuretic Peptide 118.6 (*)    All other components within normal limits  TSH - Abnormal; Notable for the following:    TSH 5.517 (*)    All other components within normal limits  I-STAT TROPOININ, ED    EKG     EKG Interpretation  Date/Time:  Wednesday Jul 25 2016 11:47:49  EDT Ventricular Rate:  57 PR Interval:    QRS Duration: 72 QT Interval:  430 QTC Calculation: 418 R Axis:   5 Text Interpretation:  NSR with motin artifact from Parkinsons tremmor Possible Inferior infarct , age undetermined Anterior infarct , age undetermined Abnormal ECG Reconfirmed by [facility]  MD, MARK ([id number]) on 07/25/2016 3:36:38 PM        Radiology Dg Chest 2 View  Result Date: 07/25/2016 CLINICAL DATA:  Abnormal EKG, atrial fibrillation/flutter, leg swelling, history hypertension, MI, Parkinson's EXAM: CHEST  2 VIEW COMPARISON:  04/19/2012 FINDINGS: Enlargement of cardiac silhouette. Atherosclerotic calcification aorta. Mediastinal contours and pulmonary vascularity normal. Minimal  LEFT basilar atelectasis. Lungs otherwise clear. No pleural effusion or pneumothorax. Bones diffusely demineralized. Prior lumbar fusion. IMPRESSION: Enlargement of cardiac silhouette with minimal LEFT basilar atelectasis. Aortic atherosclerosis. Electronically Signed   By: Ulyses Southward M.D.   On: 07/25/2016 12:49    Procedures Procedures (including critical care time)  Medications Ordered in ED Medications - No data to display   Initial Impression / Assessment and Plan / ED Course  I have reviewed the triage vital signs and the nursing notes.  Pertinent labs & imaging results that were available during my care of the patient were reviewed by me and considered in my medical decision making (see chart for details).     Patient presents with a reported abnormal EKG. Palpable pulse is regular. Patient has no acute complaints. EKG was corrected for motion artifact and found to be sinus rhythm. Skin tear addressed, laceration would likely not benefit from suturing.  Continue lasix 20 mg daily. Add 20 mg potassium daily. BMP in 1 week. Home health for wound check and TED hose.   Findings and plan of care discussed with Rolland Porter, MD. Dr. Fayrene Fearing personally evaluated and examined this patient.   Vitals:    07/25/16 1430 07/25/16 1445 07/25/16 1500 07/25/16 1555  BP: (!) 173/84 (!) 172/90 (!) 169/95 (!) 198/88  Pulse: (!) 114  63 (!) 126  Resp: 16 (!) 21 17 12   Temp:      TempSrc:      SpO2: 96%  96% 95%     Final Clinical Impressions(s) / ED Diagnoses   Final diagnoses:  Peripheral edema    New Prescriptions New Prescriptions   POTASSIUM CHLORIDE (K-DUR) 10 MEQ TABLET    Take 2 tablets (20 mEq total) by mouth daily.     Anselm Pancoast, PA-C 07/25/16 1617    Rolland Porter, MD 08/03/16 [phone]

## 2016-07-25 NOTE — ED Provider Notes (Signed)
Pt seen and evaluated with S. Joy PA-C. Patient's EKG with marked motion artifact secondary to her ongoing Parkinsonian tremor. Pulses regular and consistent at 65-70. When her EKG is repeated or patient monitor observed while tremor resolves or positive she is clearly in sinus rhythm. Clinically, and radiographically is not in congestive heart failure. If second troponin normal. Recommend continuing daily Lasix with potassium. I ordered home health care nursing to assist with compression hose daily. She will have daily changing of dressing of her wound to her lower leg. This does not require suturing. Primary care follow-up in 7-10 days for repeat basic metabolic lab.   Rolland PorterJames, Katharine Rochefort, MD 07/25/16 [phone]

## 2016-07-25 NOTE — ED Notes (Signed)
I-stat troponin collected by this nurse and walked to mini lab by Tobi BastosAnna, Charity fundraiserN.

## 2016-07-25 NOTE — Discharge Instructions (Signed)
Please do the following:  1. Lasix: Continue taking the 20 mg of Lasix daily. 2. Potassium: Begin taking the potassium supplement daily. 3. Basic metabolic panel (BMP): Go to your primary care provider to have a repeat BMP blood test drawn in 1 week. 4. Thyroid function: Your TSH value was 5.5 today, indicating possible hypothyroidism. Please follow up with your primary care provider on this matter.  5. Home health care: An order for home health care was placed for you for a nurse to help with wound care and placement of compression hose.  Follow up with your primary care provider for any other management of these issues.

## 2016-07-25 NOTE — ED Notes (Addendum)
Per EDP order, pt skin flap replaced over tear, and wound dressed with bacitracin, nonadherent Tefla, and wrapped in gauze.

## 2016-07-25 NOTE — Assessment & Plan Note (Signed)
EKG confirms irregular auscultated rhythm- a flutter vs a fib. Advised family to take her to ER and they will do so. [facility] called and advised to expect pt.

## 2016-07-26 ENCOUNTER — Telehealth: Payer: Self-pay | Admitting: *Deleted

## 2016-07-26 MED ORDER — FUROSEMIDE 20 MG PO TABS: ORAL_TABLET | ORAL | 5 refills | Status: AC

## 2016-07-26 NOTE — Telephone Encounter (Signed)
Patient's son called stating that patient was seen in the ER yesterday and was told to take Furosemide 20 mg daily. Genevie CheshireBilly stated that he went to pick a script up and there was not one there. Genevie CheshireBilly [facility] script for the patient there since 07/26/14. Billy requested that Dr. Dayton MartesAron send in a new script in because patient was told to take this for 10 days and be back in touch with PCP about doing some lab work.

## 2016-07-26 NOTE — Telephone Encounter (Signed)
eRx sent.  Please make sure she has a follow up lab visit scheduled in approx 2 weeks for BMET.  Thank you.

## 2016-07-27 NOTE — Telephone Encounter (Signed)
Spoke to [place]. We went ahead and made her a 1 week f/u for the irregular heart rhythm and [facility]

## 2016-08-01 ENCOUNTER — Encounter: Payer: Self-pay | Admitting: Family Medicine

## 2016-08-01 ENCOUNTER — Ambulatory Visit (INDEPENDENT_AMBULATORY_CARE_PROVIDER_SITE_OTHER): Payer: Medicare Other | Admitting: Family Medicine

## 2016-08-01 VITALS — BP 180/82 | HR 63 | Temp 98.3°F

## 2016-08-01 DIAGNOSIS — I10 Essential (primary) hypertension: Secondary | ICD-10-CM

## 2016-08-01 DIAGNOSIS — I499 Cardiac arrhythmia, unspecified: Secondary | ICD-10-CM | POA: Diagnosis not present

## 2016-08-01 DIAGNOSIS — R600 Localized edema: Secondary | ICD-10-CM | POA: Diagnosis not present

## 2016-08-01 LAB — COMPREHENSIVE METABOLIC PANEL
ALBUMIN: 3.9 g/dL (ref 3.5–5.2)
ALT: 15 U/L (ref 0–35)
AST: 13 U/L (ref 0–37)
Alkaline Phosphatase: 96 U/L (ref 39–117)
BUN: 24 mg/dL — AB (ref 6–23)
CHLORIDE: 101 meq/L (ref 96–112)
CO2: 36 mEq/L — ABNORMAL HIGH (ref 19–32)
CREATININE: 1.04 mg/dL (ref 0.40–1.20)
Calcium: 9.2 mg/dL (ref 8.4–10.5)
GFR: 53.79 mL/min — ABNORMAL LOW (ref >60.00)
GLUCOSE: 117 mg/dL — AB (ref 70–99)
Potassium: 3.9 mEq/L (ref 3.5–5.1)
SODIUM: 141 meq/L (ref 135–145)
Total Bilirubin: 0.5 mg/dL (ref 0.2–1.2)
Total Protein: 6.7 g/dL (ref 6.0–8.3)

## 2016-08-01 LAB — BRAIN NATRIURETIC PEPTIDE: PRO B NATRI PEPTIDE: 159 pg/mL — AB (ref 0.0–100.0)

## 2016-08-01 NOTE — Progress Notes (Signed)
Subjective:   Patient ID: Mallory Perez, female    DOB: 1931-10-09, 83 y.o.   MRN: [id number]  Mallory Perez is a pleasant 83 y.o. year old female who presents to clinic today with Hospitalization Follow-up and Hypertension  on 08/01/2016  HPI:  I saw her on 07/25/16. At that OV, I could palpated and ausculate and irregular rhythm which was confirmed on EKG. Although I was aware that her PD would cause irregular anomalies on EKG due to her tremor, I still it was warranted for her to be seen in ED given physical exam findings and patient complaint of LE and SOB.  ER notes reviewed-  BNP was 118.6 and CXR showed some signs of possible volume overload. Troponin I neg.  pt was sent home on furosemide 20 mg daily which she has been taking. ED physician felt she did not have an irregular rhythm.   Blood pressure very high today but she is often non compliant with her blood pressure medication.  Admits to not taking her atenolol today or yesterday.  Often only takes a 1/2 or a quarter tablet daily rather than twice daily.  She has tried numerous other blood pressure medications over the years and atenolol is the only one she is willing to take but often adjust her dose as she sees fit.    LE has already improved.  Lab Results  Component Value Date   CREATININE 0.90 07/25/2016   Dg Chest 2 View  Result Date: 07/25/2016 CLINICAL DATA:  Abnormal EKG, atrial fibrillation/flutter, leg swelling, history hypertension, MI, Parkinson's EXAM: CHEST  2 VIEW COMPARISON:  04/19/2012 FINDINGS: Enlargement of cardiac silhouette. Atherosclerotic calcification aorta. Mediastinal contours and pulmonary vascularity normal. Minimal LEFT basilar atelectasis. Lungs otherwise clear. No pleural effusion or pneumothorax. Bones diffusely demineralized. Prior lumbar fusion. IMPRESSION: Enlargement of cardiac silhouette with minimal LEFT basilar atelectasis. Aortic atherosclerosis. Electronically Signed   By: Ulyses Southward  M.D.   On: 07/25/2016 12:49   Current Outpatient Prescriptions on File Prior to Visit  Medication Sig Dispense Refill  . Amantadine HCl 100 MG tablet Take 0.5 tablets (50 mg total) by mouth 2 (two) times daily.    Marland Kitchen aspirin 81 MG chewable tablet Chew 81 mg by mouth daily as needed for mild pain.    Marland Kitchen atenolol (TENORMIN) 50 MG tablet TAKE ONE-HALF TABLET BY MOUTH TWO TIMES DAILY    . furosemide (LASIX) 20 MG tablet TAKE 1 TAB DAILY, IF NEEDED1 TAB IF WEIGHT GAIN GREATER THAN 2 LBS OR INCREASED LOWER EXREMITY EDEMA 60 tablet 5  . potassium chloride (K-DUR) 10 MEQ tablet Take 2 tablets (20 mEq total) by mouth daily. 60 tablet 0  . senna (SENOKOT) 8.6 MG TABS tablet Take 1 tablet by mouth 2 (two) times daily.    . traZODone (DESYREL) 50 MG tablet Take 1/4 by mouth at bedtime     No current facility-administered medications on file prior to visit.     Allergies  Allergen Reactions  . Calcium Other (See Comments)    Raises blood pressure    Past Medical History:  Diagnosis Date  . Depression    s/p ECT therapy  . Fall at home   . Hypertension   . Myocardial infarction (HCC) 1987  . Parkinson's disease     Past Surgical History:  Procedure Laterality Date  . ABDOMINAL HYSTERECTOMY  1995  . BACK SURGERY  2003 & 01/1989  . BLADDER SURGERY  09/1988  . ELECTROCONVULSIVE THERAPY    .  HIP ARTHROPLASTY  04/19/2012   Procedure: ARTHROPLASTY BIPOLAR HIP;  Surgeon: Dannielle HuhSteve Lucey, MD;  Location: [facility];  Service: Orthopedics;  Laterality: Right;    Family History  Problem Relation Age of Onset  . Angina Mother        and MI  . Alcohol abuse Other        brain tumor  . Diabetes Other     Social History   Social History  . Marital status: Married    Spouse name: N/A  . Number of children: N/A  . Years of education: N/A   Occupational History  . Not on file.   Social History Main Topics  . Smoking status: Never Smoker  . Smokeless tobacco: Never Used  . Alcohol use No  . Drug  use: No  . Sexual activity: Not on file   Other Topics Concern  . Not on file   Social History Narrative  . No narrative on file   The PMH, PSH, Social History, Family History, Medications, and allergies have [facility], and have been updated if relevant.   Review of Systems  Constitutional: Negative.   Hematological: Negative.   Psychiatric/Behavioral: Negative.   All other systems reviewed and are negative.      Objective:    BP (!) 190/92   Pulse 63   Temp 98.3 F (36.8 C)   SpO2 98%  BP Readings from Last 3 Encounters:  08/01/16 (!) 190/92  07/25/16 (!) 191/86  07/25/16 (!) 186/94     Physical Exam  Constitutional:  Well chair bound, slumped over in her usual position, PD tremor  Eyes: Conjunctivae are normal.  Cardiovascular: An irregularly irregular rhythm present.  Pulmonary/Chest: Effort normal.  Musculoskeletal: She exhibits edema.  + venous stasis changes, some improvement in LE edema, right > left  Psychiatric: She has a normal mood and affect. Her behavior is normal. Judgment and thought content normal.  Nursing note and vitals reviewed.         Assessment & Plan:   Irregular cardiac rhythm  Leg edema - Plan: Brain natriuretic peptide, Comprehensive metabolic panel  Essential hypertension No Follow-up on file.

## 2016-08-01 NOTE — Assessment & Plan Note (Signed)
Deteriorated. Had a long discussion with pt, her husband and her son about the importance of taking her atenolol as prescribed for both her heart rate and her blood pressure (may also help a bit with her tremor). The patient indicates understanding of these issues and agrees with the plan.

## 2016-08-01 NOTE — Assessment & Plan Note (Signed)
With signs of mild CHF. Some improvement today with lasix. Continue current rx- lasix and potassium. Check BNP and BMET today. The patient indicates understanding of these issues and agrees with the plan.

## 2016-08-10 ENCOUNTER — Telehealth: Payer: Self-pay | Admitting: Family Medicine

## 2016-08-10 NOTE — Telephone Encounter (Signed)
Patient's daughter,Ann,called.  She said her father received a call to schedule an appointment for patient.  Does patient need to schedule an appointment?

## 2016-08-13 NOTE — Telephone Encounter (Signed)
No I just saw her.  I did want her to restart her blood pressure medication but her son and husband were at that OV and aware.

## 2016-08-14 NOTE — Telephone Encounter (Signed)
Left detailed message.   

## 2016-08-30 ENCOUNTER — Other Ambulatory Visit: Payer: Self-pay | Admitting: Family Medicine

## 2016-08-30 MED ORDER — POTASSIUM CHLORIDE ER 10 MEQ PO TBCR: 20.0000 meq | EXTENDED_RELEASE_TABLET | Freq: Every day | ORAL | 0 refills | Status: DC

## 2016-08-30 NOTE — Telephone Encounter (Signed)
Ann (DPR signed) request refill trazodone(last refilled #30 x 5 on 08/11/15) and K (last filled by Harolyn RutherfordShawn Joy PA at ED #60 on 07/25/16) to [facility] [place] [facility]. Pt had f/u appt on 08/01/16. Ann request cb when completed.

## 2016-09-28 ENCOUNTER — Other Ambulatory Visit: Payer: Self-pay | Admitting: Family Medicine

## 2016-10-26 ENCOUNTER — Other Ambulatory Visit: Payer: Self-pay | Admitting: Family Medicine

## 2016-11-24 ENCOUNTER — Other Ambulatory Visit: Payer: Self-pay | Admitting: Family Medicine

## 2016-12-02 ENCOUNTER — Other Ambulatory Visit: Payer: Self-pay | Admitting: Family Medicine

## 2016-12-03 MED ORDER — POTASSIUM CHLORIDE CRYS ER 10 MEQ PO TBCR: 20.0000 meq | EXTENDED_RELEASE_TABLET | Freq: Every day | ORAL | 0 refills | Status: DC

## 2016-12-11 ENCOUNTER — Other Ambulatory Visit: Payer: Self-pay | Admitting: Family Medicine

## 2016-12-25 ENCOUNTER — Other Ambulatory Visit: Payer: Self-pay

## 2016-12-25 MED ORDER — POTASSIUM CHLORIDE CRYS ER 10 MEQ PO TBCR: 20.0000 meq | EXTENDED_RELEASE_TABLET | Freq: Every day | ORAL | 0 refills | Status: DC

## 2017-01-28 ENCOUNTER — Encounter: Payer: Self-pay | Admitting: Family Medicine

## 2017-02-08 ENCOUNTER — Encounter: Payer: Self-pay | Admitting: Family Medicine

## 2017-02-12 ENCOUNTER — Telehealth: Payer: Self-pay

## 2017-02-12 DIAGNOSIS — G20 Parkinson's disease: Secondary | ICD-10-CM

## 2017-02-12 NOTE — Telephone Encounter (Signed)
Referral created for [facility] with req to be seen in 2-4 weeks per TA/thx dmf

## 2017-02-21 ENCOUNTER — Other Ambulatory Visit: Payer: Self-pay | Admitting: Family Medicine

## 2017-02-23 ENCOUNTER — Other Ambulatory Visit: Payer: Self-pay | Admitting: Family Medicine

## 2017-03-21 ENCOUNTER — Ambulatory Visit: Payer: Medicare Other | Admitting: Neurology

## 2017-03-29 ENCOUNTER — Other Ambulatory Visit: Payer: Self-pay

## 2017-03-29 MED ORDER — POTASSIUM CHLORIDE CRYS ER 10 MEQ PO TBCR: 20.0000 meq | EXTENDED_RELEASE_TABLET | Freq: Every day | ORAL | 0 refills | Status: DC

## 2017-04-01 ENCOUNTER — Other Ambulatory Visit: Payer: Self-pay | Admitting: Family Medicine

## 2017-04-02 NOTE — Telephone Encounter (Signed)
Pt advised in Dec to sched appt/thx dmf

## 2017-04-04 ENCOUNTER — Other Ambulatory Visit: Payer: Self-pay | Admitting: Family Medicine

## 2017-04-04 NOTE — Telephone Encounter (Signed)
Pt advised in Dec to sched appt/thx dmf 

## 2017-04-06 ENCOUNTER — Other Ambulatory Visit: Payer: Self-pay | Admitting: Family Medicine

## 2017-04-07 ENCOUNTER — Encounter: Payer: Self-pay | Admitting: Family Medicine

## 2017-04-08 NOTE — Telephone Encounter (Signed)
Copied from CRM [id number]. Topic: Quick Communication - Rx Refill/Question >> Apr 08, 2017  3:46 PM Rudi CocoLathan, Sayra Frisby M, NT wrote: Medication: atenolol    Has the patient contacted their pharmacy? yes   (Agent: If no, request that the patient contact the pharmacy for the refill.)   Preferred Pharmacy (with phone number or street name):[facility] [address], [place] - [address] [place] [id number] Phone: [phone] Fax: [phone]     Agent: Please be advised that RX refills may take up to 3 business days. We ask that you follow-up with your pharmacy.

## 2017-04-09 ENCOUNTER — Telehealth: Payer: Self-pay | Admitting: Family Medicine

## 2017-04-09 NOTE — Telephone Encounter (Signed)
See mychart message.  Routed to PCP re: atentolol.

## 2017-05-01 ENCOUNTER — Ambulatory Visit: Payer: Medicare Other | Admitting: Neurology

## 2017-05-02 ENCOUNTER — Telehealth: Payer: Self-pay | Admitting: Family Medicine

## 2017-05-02 ENCOUNTER — Encounter: Payer: Self-pay | Admitting: Primary Care

## 2017-05-02 NOTE — Telephone Encounter (Signed)
Copied from CRM [phone]. Topic: Referral - Question >> Apr 30, 2017  1:46 PM Diana EvesHoyt, Maryann B wrote: Reason for CRM: Lowella BandyNikki with [facility] calling having some questions about a referral placed for the pt. Please call her back at [phone].   >> Apr 30, 2017  2:23 PM Marcha SoldersWalker, Monica D wrote: I returned BJYNW'GNikki's call, in reference to message that she left, requesting a return call. I left a message on her voicemail to return call. >> Apr 30, 2017  3:07 PM Maia Pettiesrtiz, Kristie S wrote: Dewayne Hatchnn, pts daughter Ph# [phone]  Appt for 2/13 was cancelled by [facility] - Dewayne HatchAnn was told that she cannot establish with the practice bc she was previously pt of Dr. Vickey Hugerohmeier and [facility] (4 years ago). Please advise. >> May 02, 2017 10:58 AM Marcha SoldersWalker, Monica D wrote: I called and spoke to patient daughter Dewayne Hatchnn. She wanted her to be referred to another Neurology office since patient has been discharged from [facility]. I offered to refer her to [facility]. [facility], but does not want to go back. Patient daughter informed me that her mom had been seen by Dr. Arbutus Leasat in that office and refuse to follow medical advice by provider. Patient is transferring [facility] this [facility]. Dr. Dayton MartesAron is currently out of the office sick and I am unable to advise with her. Patient is scheduled to see Dr. Chestine Sporelark tomorrow 05/03/17 and advised to discuss options with provider. Patient daughter verbalized understanding.

## 2017-05-02 NOTE — Telephone Encounter (Signed)
Noted, will discuss during upcoming visit. 

## 2017-05-03 ENCOUNTER — Other Ambulatory Visit: Payer: Self-pay | Admitting: *Deleted

## 2017-05-03 ENCOUNTER — Ambulatory Visit (INDEPENDENT_AMBULATORY_CARE_PROVIDER_SITE_OTHER): Payer: Medicare Other | Admitting: Primary Care

## 2017-05-03 ENCOUNTER — Encounter: Payer: Self-pay | Admitting: Primary Care

## 2017-05-03 VITALS — BP 162/84 | HR 58 | Temp 97.8°F

## 2017-05-03 DIAGNOSIS — G4700 Insomnia, unspecified: Secondary | ICD-10-CM

## 2017-05-03 DIAGNOSIS — R443 Hallucinations, unspecified: Secondary | ICD-10-CM | POA: Diagnosis not present

## 2017-05-03 DIAGNOSIS — E876 Hypokalemia: Secondary | ICD-10-CM | POA: Diagnosis not present

## 2017-05-03 DIAGNOSIS — G20 Parkinson's disease: Secondary | ICD-10-CM | POA: Diagnosis not present

## 2017-05-03 DIAGNOSIS — Z8639 Personal history of other endocrine, nutritional and metabolic disease: Secondary | ICD-10-CM

## 2017-05-03 DIAGNOSIS — R600 Localized edema: Secondary | ICD-10-CM

## 2017-05-03 DIAGNOSIS — I10 Essential (primary) hypertension: Secondary | ICD-10-CM | POA: Diagnosis not present

## 2017-05-03 LAB — COMPREHENSIVE METABOLIC PANEL
ALK PHOS: 82 U/L (ref 39–117)
ALT: 20 U/L (ref 0–35)
AST: 20 U/L (ref 0–37)
Albumin: 3.8 g/dL (ref 3.5–5.2)
BILIRUBIN TOTAL: 0.5 mg/dL (ref 0.2–1.2)
BUN: 23 mg/dL (ref 6–23)
CO2: 31 mEq/L (ref 19–32)
Calcium: 9.1 mg/dL (ref 8.4–10.5)
Chloride: 105 mEq/L (ref 96–112)
Creatinine, Ser: 0.77 mg/dL (ref 0.40–1.20)
GFR: 75.95 mL/min (ref >60.00)
GLUCOSE: 102 mg/dL — AB (ref 70–99)
Potassium: 4.2 mEq/L (ref 3.5–5.1)
SODIUM: 142 meq/L (ref 135–145)
TOTAL PROTEIN: 6.2 g/dL (ref 6.0–8.3)

## 2017-05-03 LAB — T4, FREE: FREE T4: 1.04 ng/dL (ref 0.60–1.60)

## 2017-05-03 LAB — TSH: TSH: 3.84 u[IU]/mL (ref 0.35–4.50)

## 2017-05-03 MED ORDER — AMANTADINE HCL 100 MG PO TABS: ORAL_TABLET | ORAL | 0 refills | Status: DC

## 2017-05-03 MED ORDER — POTASSIUM CHLORIDE CRYS ER 10 MEQ PO TBCR: 20.0000 meq | EXTENDED_RELEASE_TABLET | Freq: Every day | ORAL | 0 refills | Status: DC

## 2017-05-03 MED ORDER — ATENOLOL 25 MG PO TABS: ORAL_TABLET | ORAL | 0 refills | Status: DC

## 2017-05-03 MED ORDER — LOSARTAN POTASSIUM 50 MG PO TABS: ORAL_TABLET | ORAL | 0 refills | Status: DC

## 2017-05-03 MED ORDER — TRAZODONE HCL 50 MG PO TABS: ORAL_TABLET | ORAL | 0 refills | Status: DC

## 2017-05-03 NOTE — Patient Instructions (Addendum)
Stop by the lab prior to leaving today. I will notify you of your results once received.   I sent a new prescription for Trazodone. Take 1/2 tablet by mouth once nightly at bedtime as needed for insomnia.  Take atenolol 50 mg tablets. Take 1 tablet by mouth once daily for blood pressure.  Start losartan 50 mg for high blood pressure. Take 1 tablet by mouth once daily.   Stop by the front desk and speak with either Shirlee LimerickMarion or Zella Ballobin regarding your referral to Neurology.   The Amantadine is likely causing hallucinations, I'll be in touch through My Chart in regards to our next step. Start by taking 1/2 tablet once daily for now. I'll be in touch soon.  Continue potassium chloride.  Schedule a follow up visit in 2 weeks for repeat blood pressure.  It was a pleasure meeting you!

## 2017-05-03 NOTE — Progress Notes (Signed)
Subjective:    Patient ID: Mallory Perez, female    DOB: 03/14/1932, 84 y.o.   MRN: [id number]  HPI  [name] is an 84 year old female who presents today to establish care and discuss the problems mentioned below. Will obtain old records. She is with her husband and son today who are helping with HPI information.   1) Essential Hypertension: Currently managed on atenolol 25 mg twice daily. Also managed on furosemide 20 mg for lower extremity edema and is taking 1/2 tablet of furosemide once daily. Also managed on potassium 20 mEq daily. Her husband checks her blood pressure at home which runs 160's/80's.  She reports blurry vision, does have occasional headaches. She denies chest pain.  BP Readings from Last 3 Encounters:  05/03/17 (!) 162/84  08/01/16 (!) 180/82  07/25/16 (!) 191/86    2) Parkinson's Disease: Previously [facility]. Currently managed on amantadine 100 mg (1/2 tablet BID). She was dismissed from [facility] several years ago, had an appointment scheduled for 05/01/17 which was cancelled last minute by [facility] as she was dismissed years ago. She is requesting a new neurologist for treatment of tremors as her current treatment isn't helping. Her tremors are bilateral to upper extremities, mostly to left side.   3) Hallucinations: Family is with her today who reports odd behavior such as seeing random people in her home, talking to people that are not there. She's been seeing and talking with people for about one year. She is currently managed on Trazodone 50 mg and is taking 3/4 tablet at bedtime, and Amantadine 100 mg and is taking 50 mg BID. She was once on amitriptyline for insomnia, has not taken in years. The trazodone does help with insomnia. She has not undergone dementia screening recently.   Review of Systems  Eyes: Positive for visual disturbance.  Respiratory: Negative for shortness of breath.   Cardiovascular:  Negative for chest pain.  Neurological: Positive for tremors and headaches.  Psychiatric/Behavioral: Positive for hallucinations. Negative for sleep disturbance.       Past Medical History:  Diagnosis Date  . Depression    s/p ECT therapy  . Fall at home   . Hypertension   . Myocardial infarction (HCC) 1987  . Parkinson's disease      Social History   Socioeconomic History  . Marital status: Married    Spouse name: Not on file  . Number of children: Not on file  . Years of education: Not on file  . Highest education level: Not on file  Social Needs  . Financial resource strain: Not on file  . Food insecurity - worry: Not on file  . Food insecurity - inability: Not on file  . Transportation needs - medical: Not on file  . Transportation needs - non-medical: Not on file  Occupational History  . Not on file  Tobacco Use  . Smoking status: Never Smoker  . Smokeless tobacco: Never Used  Substance and Sexual Activity  . Alcohol use: No    Alcohol/week: 0.0 oz  . Drug use: No  . Sexual activity: Not on file  Other Topics Concern  . Not on file  Social History Narrative  . Not on file    Past Surgical History:  Procedure Laterality Date  . ABDOMINAL HYSTERECTOMY  1995  . BACK SURGERY  2003 & 01/1989  . BLADDER SURGERY  09/1988  . ELECTROCONVULSIVE THERAPY    . HIP ARTHROPLASTY  04/19/2012   Procedure: ARTHROPLASTY BIPOLAR HIP;  Surgeon: Dannielle Huh, MD;  Location: [facility] OR;  Service: Orthopedics;  Laterality: Right;    Family History  Problem Relation Age of Onset  . Angina Mother        and MI  . Alcohol abuse Other        brain tumor  . Diabetes Other     Allergies  Allergen Reactions  . Calcium Other (See Comments)    Raises blood pressure    Current Outpatient Medications on File Prior to Visit  Medication Sig Dispense Refill  . aspirin 81 MG chewable tablet Chew 81 mg by mouth daily as needed for mild pain.    . furosemide (LASIX) 20 MG tablet TAKE 1  TAB DAILY, IF NEEDED1 TAB IF WEIGHT GAIN GREATER THAN 2 LBS OR INCREASED LOWER EXREMITY EDEMA 60 tablet 5  . senna (SENOKOT) 8.6 MG TABS tablet Take 1 tablet by mouth 2 (two) times daily.     No current facility-administered medications on file prior to visit.     BP (!) 162/84   Pulse (!) 58   Temp 97.8 F (36.6 C) (Oral)   SpO2 97%    Objective:   Physical Exam  Constitutional: She is oriented to person, place, and time. She appears well-nourished.  Neck: Neck supple.  Cardiovascular: Regular rhythm.  Sinus brady around 58  Pulmonary/Chest: Effort normal and breath sounds normal.  Mild lower extremity edema to bilateral ankles and feet.  Neurological: She is oriented to person, place, and time.  Bilateral resting tremors to upper extremities, left worse than right. Participates in HPI, answers questions appropriately.   Skin: Skin is warm and dry.  Dry, flaky skin to lower extremities and trunk          Assessment & Plan:

## 2017-05-03 NOTE — Assessment & Plan Note (Signed)
Managed on Trazodone 50 mg and is taking 3/4 tablet? Will reduce to Trazodone 50 mg and have her take 1/2 tablet. Continue to monitor.

## 2017-05-03 NOTE — Assessment & Plan Note (Signed)
Family are poor historians, patient somewhat better with HPI information.  Suspect Amantadine are either causing or contributing to hallucinations so will slowly wean off. Wean down to 1/2 tablet once daily x 2 weeks then stop. Referral placed to Neurology for parkinson's treatment and also for dementia evaluation, consider Lewy Body.   She was alert and oriented today, answered questions appropriately.

## 2017-05-03 NOTE — Assessment & Plan Note (Signed)
Above goal in the office today, also on prior visits.  HR low on atenolol, change to 25 mg once daily. Keep beta blocker given history of MI. Start losartan 50 mg once daily for BP control. Check BMP today and also in 2 weeks for monitoring of potassium and renal function.  Continue oral potassium for now. Follow up in 2 weeks for BP check and BMP.

## 2017-05-03 NOTE — Assessment & Plan Note (Signed)
Present for the past one year. Side effect of amantadine. Will wean off of amantadine slowly over the next two weeks to see if this is the cause, especially since she's seeing no benefit to parkinson's symptoms.   Will be seeing neurology, consider dementia such as Lewy Body.

## 2017-05-03 NOTE — Assessment & Plan Note (Signed)
Managed on potassium 20 mEq daily, continue for now. Will be adding losartan to BP regimen. BMP pending today, also check in 2 weeks.

## 2017-05-03 NOTE — Assessment & Plan Note (Signed)
Managed on furosemide 10 mg daily (1/2 of 20 mg tab). Continue for now. BMP pending.

## 2017-05-09 NOTE — Telephone Encounter (Signed)
Daughter following up on this message.  She is at work, but her father called and states pt's bp is 172/89 this am.  Daughter did not give pt any med last night, but gave her the atenolol (TENORMIN) 25 MG tablet this morning.  pt did not take the losartan today.  Would like you to call her or [phone]

## 2017-05-13 ENCOUNTER — Telehealth: Payer: Self-pay | Admitting: Primary Care

## 2017-05-13 DIAGNOSIS — G20 Parkinson's disease: Secondary | ICD-10-CM | POA: Diagnosis not present

## 2017-05-13 NOTE — Telephone Encounter (Signed)
Noted. [name] had send a message through [employer].

## 2017-05-13 NOTE — Telephone Encounter (Signed)
[facility]. The nurse stated that patient was seen on today for her parkinson's disease. They wanted primary care provider to be aware that patient's BP have been elevated.   Nurse stated that patient told them that at home BP was 144/74 and P 50.  In office with machine is 205/106 with P 64. Manual BP was 194/104 with P 64.  Please advise.

## 2017-05-13 NOTE — Telephone Encounter (Signed)
Please call patient and her family, have them increase Losartan to 100 mg once daily (take two of the 50 mg tablets), monitor BP, and see me next week as scheduled. Have them update me Friday this week with BP readings. Can send through my chart.

## 2017-05-20 ENCOUNTER — Ambulatory Visit: Payer: Medicare Other | Admitting: Primary Care

## 2017-05-20 ENCOUNTER — Ambulatory Visit (INDEPENDENT_AMBULATORY_CARE_PROVIDER_SITE_OTHER): Payer: Medicare Other | Admitting: Primary Care

## 2017-05-20 ENCOUNTER — Encounter: Payer: Self-pay | Admitting: Primary Care

## 2017-05-20 VITALS — BP 136/80 | HR 63 | Temp 98.0°F

## 2017-05-20 DIAGNOSIS — G20 Parkinson's disease: Secondary | ICD-10-CM

## 2017-05-20 DIAGNOSIS — I10 Essential (primary) hypertension: Secondary | ICD-10-CM | POA: Diagnosis not present

## 2017-05-20 DIAGNOSIS — E876 Hypokalemia: Secondary | ICD-10-CM | POA: Diagnosis not present

## 2017-05-20 LAB — BASIC METABOLIC PANEL
BUN: 22 mg/dL (ref 6–23)
CHLORIDE: 105 meq/L (ref 96–112)
CO2: 30 mEq/L (ref 19–32)
Calcium: 9.1 mg/dL (ref 8.4–10.5)
Creatinine, Ser: 0.65 mg/dL (ref 0.40–1.20)
GFR: 92.34 mL/min (ref >60.00)
Glucose, Bld: 100 mg/dL — ABNORMAL HIGH (ref 70–99)
POTASSIUM: 4.2 meq/L (ref 3.5–5.1)
SODIUM: 141 meq/L (ref 135–145)

## 2017-05-20 MED ORDER — LOSARTAN POTASSIUM 100 MG PO TABS: ORAL_TABLET | ORAL | 3 refills | Status: AC

## 2017-05-20 NOTE — Progress Notes (Signed)
Subjective:    Patient ID: Mallory Perez, female    DOB: [birth year], 84 y.o.   MRN: [id number]  HPI  Mallory Perez is an 84 year old female who presents today for follow up.  She was last evaluated two weeks ago, transferred care from Dr. Dayton MartesAron and noted to be hypertensive in the office and on prior visits. She was noted to be bradycardic so her atenolol was changed to 25 mg once daily and losartan 50 mg was added.   Her family kept monitoring her BP at home and after one week after the addition of Losartan 50 mg, she continued to have readings in the 160's-190's/90's. Her Losartan was increased to 100 mg one week ago.  Her family member sent a my chart message this morning with readings in the 140's-150's/70's-90's, one reading of 78/50. Her family is checking her BP 2-3 times daily on average. She is compliant to her losartan 100 mg.  She denies chest pain.  BP Readings from Last 3 Encounters:  05/20/17 136/80  05/03/17 (!) 162/84  08/01/16 (!) 180/82     Review of Systems  Respiratory: Negative for shortness of breath.   Cardiovascular: Negative for chest pain.  Neurological: Negative for dizziness and headaches.       Past Medical History:  Diagnosis Date  . Depression    s/p ECT therapy  . Fall at home   . Hypertension   . Myocardial infarction (HCC) 1987  . Parkinson's disease      Social History   Socioeconomic History  . Marital status: Married    Spouse name: Not on file  . Number of children: Not on file  . Years of education: Not on file  . Highest education level: Not on file  Social Needs  . Financial resource strain: Not on file  . Food insecurity - worry: Not on file  . Food insecurity - inability: Not on file  . Transportation needs - medical: Not on file  . Transportation needs - non-medical: Not on file  Occupational History  . Not on file  Tobacco Use  . Smoking status: Never Smoker  . Smokeless tobacco: Never Used  Substance and Sexual  Activity  . Alcohol use: No    Alcohol/week: 0.0 oz  . Drug use: No  . Sexual activity: Not on file  Other Topics Concern  . Not on file  Social History Narrative  . Not on file    Past Surgical History:  Procedure Laterality Date  . ABDOMINAL HYSTERECTOMY  1995  . BACK SURGERY  2003 & 01/1989  . BLADDER SURGERY  09/1988  . ELECTROCONVULSIVE THERAPY    . HIP ARTHROPLASTY  04/19/2012   Procedure: ARTHROPLASTY BIPOLAR HIP;  Surgeon: Dannielle HuhSteve Lucey, MD;  Location: [facility] OR;  Service: Orthopedics;  Laterality: Right;    Family History  Problem Relation Age of Onset  . Angina Mother        and MI  . Alcohol abuse Other        brain tumor  . Diabetes Other     Allergies  Allergen Reactions  . Calcium Other (See Comments)    Raises blood pressure    Current Outpatient Medications on File Prior to Visit  Medication Sig Dispense Refill  . aspirin 81 MG chewable tablet Chew 81 mg by mouth daily as needed for mild pain.    Marland Kitchen. atenolol (TENORMIN) 25 MG tablet Take 1 tablet by mouth once daily for blood pressure. 90  tablet 0  . carbidopa-levodopa (SINEMET IR) 25-100 MG tablet Take by mouth.    . furosemide (LASIX) 20 MG tablet TAKE 1 TAB DAILY, IF NEEDED1 TAB IF WEIGHT GAIN GREATER THAN 2 LBS OR INCREASED LOWER EXREMITY EDEMA 60 tablet 5  . potassium chloride (KLOR-CON M10) 10 MEQ tablet Take 2 tablets (20 mEq total) by mouth daily. 180 tablet 0  . senna (SENOKOT) 8.6 MG TABS tablet Take 1 tablet by mouth 2 (two) times daily.    . traZODone (DESYREL) 50 MG tablet Take 1/2 tablet by mouth at bedtime as needed for sleep. 45 tablet 0   No current facility-administered medications on file prior to visit.     BP 136/80   Pulse 63   Temp 98 F (36.7 C) (Oral)   SpO2 97%    Objective:   Physical Exam  Constitutional: She appears well-nourished.  Neck: Neck supple.  Cardiovascular: Normal rate and regular rhythm.  Pulmonary/Chest: Effort normal and breath sounds normal.  Skin: Skin is  warm and dry.          Assessment & Plan:

## 2017-05-20 NOTE — Patient Instructions (Addendum)
We've changed your Losartan pills from the 50 mg tablets to the 100 mg tablets. Only take one of the 100 mg tablets once daily. You may continue to take two of the 50 mg tablets until your bottle is empty.  Stop by the lab prior to leaving today. I will notify you of your results once received.   Continue to monitor your blood pressure several times weekly and notify me of readings at or above 140/90.  Please schedule a follow up appointment in 6 months.   It was a pleasure to see you today!

## 2017-05-20 NOTE — Assessment & Plan Note (Signed)
Improved in the office today, continue Losartan 100 mg and atenolol 25 mg once daily. BMP pending. Discussed to monitor readings and report readings at or above 140-150/90.

## 2017-05-20 NOTE — Assessment & Plan Note (Signed)
Weaned off of amantadine successfully.  [facility].

## 2017-05-20 NOTE — Assessment & Plan Note (Signed)
BMP pending since addition of Losartan.

## 2017-05-30 ENCOUNTER — Encounter (HOSPITAL_COMMUNITY): Payer: Self-pay | Admitting: Emergency Medicine

## 2017-05-30 ENCOUNTER — Emergency Department (HOSPITAL_COMMUNITY)
Admission: EM | Admit: 2017-05-30 | Discharge: 2017-05-30 | Disposition: A | Discharge status: Home / Self Care | Payer: Medicare Other | Attending: Emergency Medicine | Admitting: Emergency Medicine

## 2017-05-30 ENCOUNTER — Emergency Department (HOSPITAL_COMMUNITY): Payer: Medicare Other

## 2017-05-30 DIAGNOSIS — R531 Weakness: Secondary | ICD-10-CM | POA: Insufficient documentation

## 2017-05-30 DIAGNOSIS — Z79899 Other long term (current) drug therapy: Secondary | ICD-10-CM | POA: Diagnosis not present

## 2017-05-30 DIAGNOSIS — Z96641 Presence of right artificial hip joint: Secondary | ICD-10-CM | POA: Diagnosis not present

## 2017-05-30 DIAGNOSIS — I10 Essential (primary) hypertension: Secondary | ICD-10-CM | POA: Diagnosis not present

## 2017-05-30 DIAGNOSIS — R55 Syncope and collapse: Secondary | ICD-10-CM

## 2017-05-30 DIAGNOSIS — I252 Old myocardial infarction: Secondary | ICD-10-CM | POA: Diagnosis not present

## 2017-05-30 DIAGNOSIS — R5383 Other fatigue: Secondary | ICD-10-CM | POA: Diagnosis not present

## 2017-05-30 DIAGNOSIS — R404 Transient alteration of awareness: Secondary | ICD-10-CM | POA: Diagnosis not present

## 2017-05-30 DIAGNOSIS — G20 Parkinson's disease: Secondary | ICD-10-CM | POA: Diagnosis not present

## 2017-05-30 DIAGNOSIS — Z7982 Long term (current) use of aspirin: Secondary | ICD-10-CM | POA: Insufficient documentation

## 2017-05-30 DIAGNOSIS — R464 Slowness and poor responsiveness: Secondary | ICD-10-CM | POA: Diagnosis not present

## 2017-05-30 LAB — COMPREHENSIVE METABOLIC PANEL
ALT: 8 U/L — AB (ref 14–54)
AST: 24 U/L (ref 15–41)
Albumin: 3.6 g/dL (ref 3.5–5.0)
Alkaline Phosphatase: 102 U/L (ref 38–126)
Anion gap: 12 (ref 5–15)
BUN: 20 mg/dL (ref 6–20)
CHLORIDE: 103 mmol/L (ref 101–111)
CO2: 26 mmol/L (ref 22–32)
CREATININE: 0.84 mg/dL (ref 0.44–1.00)
Calcium: 9.3 mg/dL (ref 8.9–10.3)
Glucose, Bld: 95 mg/dL (ref 65–99)
Potassium: 4.3 mmol/L (ref 3.5–5.1)
SODIUM: 141 mmol/L (ref 135–145)
Total Bilirubin: 0.7 mg/dL (ref 0.3–1.2)
Total Protein: 6.2 g/dL — ABNORMAL LOW (ref 6.5–8.1)

## 2017-05-30 LAB — CBC WITH DIFFERENTIAL/PLATELET
Basophils Absolute: 0 10*3/uL (ref 0.0–0.1)
Basophils Relative: 0 %
EOS ABS: 0.1 10*3/uL (ref 0.0–0.7)
Eosinophils Relative: 2 %
HEMATOCRIT: 49.3 % — AB (ref 36.0–46.0)
HEMOGLOBIN: 15.9 g/dL — AB (ref 12.0–15.0)
LYMPHS ABS: 1.8 10*3/uL (ref 0.7–4.0)
Lymphocytes Relative: 23 %
MCH: 29.5 pg (ref 26.0–34.0)
MCHC: 32.3 g/dL (ref 30.0–36.0)
MCV: 91.5 fL (ref 78.0–100.0)
MONOS PCT: 8 %
Monocytes Absolute: 0.6 10*3/uL (ref 0.1–1.0)
NEUTROS ABS: 5.3 10*3/uL (ref 1.7–7.7)
Neutrophils Relative %: 67 %
Platelets: 155 10*3/uL (ref 150–400)
RBC: 5.39 MIL/uL — AB (ref 3.87–5.11)
RDW: 14.4 % (ref 11.5–15.5)
WBC: 7.9 10*3/uL (ref 4.0–10.5)

## 2017-05-30 LAB — I-STAT TROPONIN, ED: TROPONIN I, POC: 0 ng/mL (ref 0.00–0.08)

## 2017-05-30 NOTE — ED Notes (Signed)
Pt placed on PureWick per Hayley (RN) 

## 2017-05-30 NOTE — ED Triage Notes (Signed)
To ED via [facility] from home with c/o syncopal episode at home, recently changed BP meds and has been having syncopal episodes per family since-- lives with family-- is chair bound-- nonambulatory x 15 years,

## 2017-05-30 NOTE — ED Provider Notes (Signed)
[facility] EMERGENCY DEPARTMENT Provider Note   CSN: [id number] Arrival date & time: 05/30/17  4098     History   Chief Complaint Chief Complaint  Patient presents with  . Near Syncope    HPI Mallory Perez is a 84 y.o. female.  HPI Patient has advanced Parkinson's disease.  She has not tolerated traditional medications in the past.  Her neurologist did restart carbidopa levodopa approximately 10 days ago.  Patient has not noted any improvement.  At baseline, the patient is nonambulatory.  Her husband moves her from a wheelchair to seated position.  Patient however at baseline is also alert and normally conversant.  He had an episode this morning shortly after breakfast where she became unresponsive.  She had eaten a normal breakfast and taken her blood pressure medication (Cozaar which within the past 2 weeks have been increased from a 50 mg dose 200 mg dose) and carbidopa levodopa.  Her husband placed her in her chair and went to do a couple chores outside.  When he came back she seem to be sleeping but he could not get her to awaken.  With brisk stimulus the patient was not awakening.  He called his daughters and they subsequently called EMS.  Patient began to slowly come back to normal baseline.  Patient reports that she could hear them talking and calling to her but she was unable to respond at that time.  She reports now she just feels generally fatigued and slightly weak but does not have any other focal symptoms.  She denies headache, chest pain, abdominal pain. Past Medical History:  Diagnosis Date  . Depression    s/p ECT therapy  . Fall at home   . Hypertension   . Myocardial infarction (HCC) 1987  . Parkinson's disease     Patient Active Problem List   Diagnosis Date Noted  . Hallucinations 05/03/2017  . Irregular cardiac rhythm 07/25/2016  . Need for home health care 12/08/2015  . Weakness 12/08/2015  . Osteoporosis 09/27/2014  . Fracture of  femoral neck, right (HCC) 04/19/2012  . Leg edema 01/03/2012  . Poor circulation of extremity 08/01/2011  . HEARING LOSS, BILATERAL 04/11/2010  . UNSPECIFIED ANEMIA 02/23/2010  . DEPRESSION 10/01/2008  . PARKINSON'S DISEASE 10/01/2008  . Essential hypertension 10/01/2008  . MYOCARDIAL INFARCTION, HX OF 10/01/2008  . INSOMNIA 08/05/2007  . HYPOKALEMIA 04/03/2007    Past Surgical History:  Procedure Laterality Date  . ABDOMINAL HYSTERECTOMY  1995  . BACK SURGERY  2003 & 01/1989  . BLADDER SURGERY  09/1988  . ELECTROCONVULSIVE THERAPY    . HIP ARTHROPLASTY  04/19/2012   Procedure: ARTHROPLASTY BIPOLAR HIP;  Surgeon: Dannielle Huh, MD;  Location: [facility] OR;  Service: Orthopedics;  Laterality: Right;    OB History    No data available       Home Medications    Prior to Admission medications   Medication Sig Start Date End Date Taking? Authorizing Provider  aspirin 81 MG chewable tablet Chew 81 mg by mouth daily as needed for mild pain.   Yes [provider]  atenolol (TENORMIN) 25 MG tablet Take 25 mg by mouth daily.   Yes [provider]  carbidopa-levodopa (SINEMET IR) 25-100 MG tablet Take 1 tablet by mouth 3 (three) times daily. 7 am- 2pm- 8 pm 05/14/17 06/13/17 Yes [provider]  furosemide (LASIX) 20 MG tablet TAKE 1 TAB DAILY, IF NEEDED1 TAB IF WEIGHT GAIN GREATER THAN 2 LBS OR  INCREASED LOWER EXREMITY EDEMA 07/26/16  Yes Dianne DunAron, Talia M, MD  losartan (COZAAR) 100 MG tablet Take 1 tablet by mouth once daily for high blood pressure. 05/20/17  Yes Doreene Nestlark, Katherine K, NP  potassium chloride (KLOR-CON M10) 10 MEQ tablet Take 2 tablets (20 mEq total) by mouth daily. Patient taking differently: Take 10 mEq by mouth 2 (two) times daily.  05/03/17  Yes Doreene Nestlark, Katherine K, NP  senna (SENOKOT) 8.6 MG TABS tablet Take 1 tablet by mouth 2 (two) times daily.   Yes [provider]  traZODone (DESYREL) 50 MG tablet Take 1/2 tablet by mouth at bedtime as needed for  sleep. 05/03/17  Yes Doreene Nestlark, Katherine K, NP  atenolol (TENORMIN) 50 MG tablet Take 50 mg by mouth daily. 04/09/17   [provider]    Family History Family History  Problem Relation Age of Onset  . Angina Mother        and MI  . Alcohol abuse Other        brain tumor  . Diabetes Other     Social History Social History   Tobacco Use  . Smoking status: Never Smoker  . Smokeless tobacco: Never Used  Substance Use Topics  . Alcohol use: No    Alcohol/week: 0.0 oz  . Drug use: No     Allergies   Calcium   Review of Systems Review of Systems 10 Systems reviewed and are negative for acute change except as noted in the HPI.   Physical Exam Updated Vital Signs BP (!) 170/82   Pulse 70   Resp 20   SpO2 98%   Physical Exam  Constitutional: She is oriented to person, place, and time.  Patient is alert and in no acute distress.  She does have physical contracture of her neck causing her head to be significantly tilted onto her left shoulder.  No respiratory distress.  HENT:  Head: Normocephalic and atraumatic.  Mucous membranes moist.  Neck:  Patient's neck has significant baseline torticollis causing her head to be severely tilted to the left and permanently in that position.  Cardiovascular: Normal rate, regular rhythm, normal heart sounds and intact distal pulses.  Pulmonary/Chest: Effort normal and breath sounds normal.  Abdominal: Soft. She exhibits no distension. There is no tenderness. There is no guarding.  Musculoskeletal:  1-2+ pitting edema bilateral lower extremities.  She has chronic skin thinning and changes of venous stasis.  Neurological: She is alert and oriented to person, place, and time.  Patient has slight tremor bilateral upper arms.  Arms are held slightly contractured and bent at the elbows.  Patient cannot legs much more than moving her toes.  Skin: Skin is warm and dry.  Psychiatric: She has a normal mood and affect.     ED  Treatments / Results  Labs (all labs ordered are listed, but only abnormal results are displayed) Labs Reviewed  COMPREHENSIVE METABOLIC PANEL - Abnormal; Notable for the following components:      Result Value   Total Protein 6.2 (*)    ALT 8 (*)    All other components within normal limits  CBC WITH DIFFERENTIAL/PLATELET - Abnormal; Notable for the following components:   RBC 5.39 (*)    Hemoglobin 15.9 (*)    HCT 49.3 (*)    All other components within normal limits  I-STAT TROPONIN, ED    EKG  EKG Interpretation  Date/Time:  Thursday May 30 2017 09:24:52 EDT Ventricular Rate:  65 PR Interval:  QRS Duration: 93 QT Interval:  428 QTC Calculation: 445 R Axis:   1 Text Interpretation:  NSR. baseline artifact, otherwise normal, Confirmed by Arby Barrette [phone]) on 05/30/2017 2:12:36 PM       Radiology Dg Chest Port 1 View  Result Date: 05/30/2017 CLINICAL DATA:  Syncope. EXAM: PORTABLE CHEST 1 VIEW COMPARISON:  07/25/2016 FINDINGS: Normal heart size. Aortic atherosclerosis noted. No pleural effusion or edema identified. No airspace opacities. IMPRESSION: 1. No acute cardiopulmonary abnormalities. 2.  Aortic Atherosclerosis (ICD10-I70.0). Electronically Signed   By: Signa Kell M.D.   On: 05/30/2017 11:51    Procedures Procedures (including critical care time)  Medications Ordered in ED Medications - No data to display   Initial Impression / Assessment and Plan / ED Course  I have reviewed the triage vital signs and the nursing notes.  Pertinent labs & imaging results that were available during my care of the patient were reviewed by me and considered in my medical decision making (see chart for details).     Final Clinical Impressions(s) / ED Diagnoses   Final diagnoses:  Near syncope  Weakness  Patient had an episode of a poorly responsive.  Patient reports that she could hear people around her but could not respond.  She does have advanced  Parkinson's and had just recently been restarted on carbidopa levodopa.  She reports she has tolerated this poorly in the past but they were going to try it again.  She is otherwise been stable.  Mental status is clear.  Diagnostic workup is unremarkable at this time.  Patient and family will be advised to reconsult neurology concerning continuing the carbidopa levodopa versus other possible etiologies.  At this time I have low suspicion for acute stroke.  Patient did not experience any chest pain, shortness of breath.  Her EKG is normal and troponin negative.  Return precautions reviewed.  ED Discharge Orders    None       Arby Barrette, MD 05/30/17 1416

## 2017-06-11 DIAGNOSIS — G251 Drug-induced tremor: Secondary | ICD-10-CM | POA: Diagnosis not present

## 2017-06-23 ENCOUNTER — Other Ambulatory Visit: Payer: Self-pay | Admitting: Primary Care

## 2017-06-23 DIAGNOSIS — I10 Essential (primary) hypertension: Secondary | ICD-10-CM

## 2017-08-05 ENCOUNTER — Other Ambulatory Visit: Payer: Self-pay | Admitting: Primary Care

## 2017-08-05 DIAGNOSIS — I10 Essential (primary) hypertension: Secondary | ICD-10-CM

## 2017-08-28 ENCOUNTER — Encounter: Payer: Self-pay | Admitting: Primary Care

## 2017-08-28 NOTE — Telephone Encounter (Signed)
Mallory Perez,  I didn't see an option to place a palliative care referral, would this be home health referral? Looks like they need help with physical ADL's for this patient who suffers from end stage Parkinson's Disease. Let me know how I can proceed. See my chart message.

## 2017-08-29 ENCOUNTER — Other Ambulatory Visit: Payer: Self-pay | Admitting: Primary Care

## 2017-08-29 DIAGNOSIS — R531 Weakness: Secondary | ICD-10-CM

## 2017-08-29 DIAGNOSIS — G20 Parkinson's disease: Secondary | ICD-10-CM

## 2017-08-29 DIAGNOSIS — M810 Age-related osteoporosis without current pathological fracture: Secondary | ICD-10-CM

## 2017-08-29 DIAGNOSIS — I252 Old myocardial infarction: Secondary | ICD-10-CM

## 2017-08-30 ENCOUNTER — Telehealth: Payer: Self-pay

## 2017-08-30 NOTE — Telephone Encounter (Signed)
Phone call placed to patient, spoke with daughter to introduce palliative care and offer to schedule visit with NP. Scheduled for Tuesday 09/03/17

## 2017-09-02 ENCOUNTER — Encounter: Payer: Self-pay | Admitting: Primary Care

## 2017-09-02 DIAGNOSIS — R350 Frequency of micturition: Secondary | ICD-10-CM

## 2017-09-03 ENCOUNTER — Other Ambulatory Visit: Payer: Medicare Other | Admitting: Hospice and Palliative Medicine

## 2017-09-03 DIAGNOSIS — Z515 Encounter for palliative care: Secondary | ICD-10-CM | POA: Diagnosis not present

## 2017-09-04 NOTE — Progress Notes (Signed)
PALLIATIVE CARE CONSULT VISIT   PATIENT NAME: Mallory Perez DOB: 1931-05-05 MRN: [id number]  PRIMARY CARE PROVIDER:   Doreene Nestlark, Katherine K, NP  REFERRING PROVIDER:  Doreene Nestlark, Katherine K, NP [address] [place], [place] [address]  RESPONSIBLE PARTY:   Husband  ASSESSMENT:      Ms. Mallory Perez has had progressive functional decline associated with PD. Per family report, patient was last ambulatory about six months ago. She was last able to stand independently about three months ago. She is now chairbound and requires maximum assistance from husband (who is her primary caregiver) for all ADLs. She is intermittently incontinent of bowel and bladder but can toilet when on a schedule. Patient has had some skin breakdown to sacrum and legs. Oral intake is reportedly good and no overt signs of aspiration are reported. No recent infections or hospitalizations noted. Patient sleeps well at night and is not currently in pain. She has had some increasing visual hallucinations and confusion over past few months.   I had a lengthy conversation with patient's husband, son, and daughter. All feel she has significantly declined. Care has become more difficult recently for husband to manage at home. However, husband says he is committed to keeping her at home. Family is requesting more resources in the home. We discussed resources through Medicare including home health vs home hospice. I do not think patient would have much benefit from rehab at this point. She would certainly be eligible for hospice in the setting of continued decline (weight loss, aspiration, etc). However, family would be interested in speaking with a SW to further explore home resources.   We discussed code status today. Family agrees that patient would not want to be resuscitated. I completed a DNR form today and left it in the home.     RECOMMENDATIONS and PLAN:  1. SW consult 2. Consider hospice with further decline 3. DNR  I spent 60  minutes providing this consultation,  from 1300 to 1400. More than 50% of the time in this consultation was spent coordinating communication.   HISTORY OF PRESENT ILLNESS:  Mallory Perez is a 84 y.o. year old female with multiple medical problems including PD, CAD, HTN, osteoporosis, h/o hip fracture. Palliative Care was asked to help address goals of care.   CODE STATUS: DNR  PPS: 30% HOSPICE ELIGIBILITY/DIAGNOSIS: TBD  PAST MEDICAL HISTORY:  Past Medical History:  Diagnosis Date  . Depression    s/p ECT therapy  . Fall at home   . Hypertension   . Myocardial infarction (HCC) 1987  . Parkinson's disease     SOCIAL HX:  Social History   Tobacco Use  . Smoking status: Never Smoker  . Smokeless tobacco: Never Used  Substance Use Topics  . Alcohol use: No    Alcohol/week: 0.0 oz    ALLERGIES:  Allergies  Allergen Reactions  . Calcium Other (See Comments)    Raises blood pressure     PERTINENT MEDICATIONS:  Outpatient Encounter Medications as of 09/03/2017  Medication Sig  . aspirin 81 MG chewable tablet Chew 81 mg by mouth daily as needed for mild pain.  Marland Kitchen. atenolol (TENORMIN) 25 MG tablet TAKE 1 TABLET BY MOUTH ONCE DAILY FOR BLOOD PRESSURE  . CARBIDOPA-LEVODOPA EN   . furosemide (LASIX) 20 MG tablet TAKE 1 TAB DAILY, IF NEEDED1 TAB IF WEIGHT GAIN GREATER THAN 2 LBS OR INCREASED LOWER EXREMITY EDEMA  . losartan (COZAAR) 100 MG tablet Take 1 tablet by  mouth once daily for high blood pressure.  . potassium chloride (K-DUR) 10 MEQ tablet TAKE 2 TABLETS BY MOUTH ONCE DAILY  . senna (SENOKOT) 8.6 MG TABS tablet Take 1 tablet by mouth 2 (two) times daily.  . traZODone (DESYREL) 50 MG tablet Take 1/2 tablet by mouth at bedtime as needed for sleep.   No facility-administered encounter medications on file as of 09/03/2017.     PHYSICAL EXAM:   General: NAD, frail appearing, thin, sitting in chair Cardiovascular: regular rate and rhythm Pulmonary: clear ant fields Abdomen:  soft, nontender, + bowel sounds GU: no suprapubic tenderness Extremities: no edema, no joint deformities Skin: no rashes Neurological: Weakness, speech clear, able to complete sentences albeit with some confusion, follows simple commands, tremors noted, head slouched to L. side  Malachy Moan, NP

## 2017-09-05 DIAGNOSIS — R350 Frequency of micturition: Secondary | ICD-10-CM | POA: Diagnosis not present

## 2017-09-05 LAB — POC URINALSYSI DIPSTICK (AUTOMATED)
Bilirubin, UA: NEGATIVE
Blood, UA: NEGATIVE
Glucose, UA: NEGATIVE
Ketones, UA: NEGATIVE
Leukocytes, UA: NEGATIVE
NITRITE UA: POSITIVE
PH UA: 6 (ref 5.0–8.0)
PROTEIN UA: POSITIVE — AB
Spec Grav, UA: 1.025 (ref 1.010–1.025)
Urobilinogen, UA: 0.2 E.U./dL

## 2017-09-06 LAB — URINE CULTURE
MICRO NUMBER:: 90739866
SPECIMEN QUALITY:: ADEQUATE

## 2017-09-11 DIAGNOSIS — R251 Tremor, unspecified: Secondary | ICD-10-CM | POA: Diagnosis not present

## 2017-09-11 DIAGNOSIS — M542 Cervicalgia: Secondary | ICD-10-CM | POA: Diagnosis not present

## 2017-09-11 DIAGNOSIS — R441 Visual hallucinations: Secondary | ICD-10-CM | POA: Diagnosis not present

## 2017-10-16 ENCOUNTER — Telehealth: Payer: Self-pay

## 2017-10-16 NOTE — Telephone Encounter (Signed)
Clarified time of visit for 10/17/17 @ 9:30am

## 2017-10-16 NOTE — Telephone Encounter (Signed)
Received phone call from patient's daughter who requested a follow up visit with Palliative Care. Patient has some wounds on her legs that daughter feels are coming from patient rubbing her legs against something. NP to see patient.

## 2017-10-17 ENCOUNTER — Other Ambulatory Visit: Payer: Self-pay

## 2017-10-17 ENCOUNTER — Other Ambulatory Visit: Payer: Medicare Other | Admitting: Hospice and Palliative Medicine

## 2017-10-17 DIAGNOSIS — Z515 Encounter for palliative care: Secondary | ICD-10-CM

## 2017-10-17 NOTE — Progress Notes (Signed)
    PALLIATIVE CARE CONSULT VISIT   PATIENT NAME: Mallory Perez DOB: [birth year] MRN: [id number]  PRIMARY CARE PROVIDER:   Doreene Nestlark, Katherine K, NP  REFERRING PROVIDER:  Doreene Nestlark, Katherine K, NP [address] [place], [place] [address]  RESPONSIBLE PARTY:   Husband  ASSESSMENT:     Patient appears to be clinically about the same. Recently, patient's husband, who was her primary caregiver, was hospitalized with a CVA. He is now in rehab. Family has hired someone to stay with patient M-F 8-5. Family is staying with her at night. Meals are being brought to her from [employer]. Again, I discussed hospice with patient's daughter-in-law, who was present in the home today. Family would like to speak with SW.   RECOMMENDATIONS and PLAN:  1. SW consult 2. Consider hospice  I spent 30 minutes providing this consultation,  from 0930 to 1000. More than 50% of the time in this consultation was spent coordinating communication.   HISTORY OF PRESENT ILLNESS:  Mallory Danielnna B Riedesel is a 84 y.o. year old female with multiple medical problems including PD, CAD, HTN, osteoporosis, h/o hip fracture. Palliative Care was asked to help address goals of care.   CODE STATUS: DNR  PPS: 30% HOSPICE ELIGIBILITY/DIAGNOSIS: TBD  PAST MEDICAL HISTORY:  Past Medical History:  Diagnosis Date  . Depression    s/p ECT therapy  . Fall at home   . Hypertension   . Myocardial infarction (HCC) 1987  . Parkinson's disease     SOCIAL HX:  Social History   Tobacco Use  . Smoking status: Never Smoker  . Smokeless tobacco: Never Used  Substance Use Topics  . Alcohol use: No    Alcohol/week: 0.0 oz    ALLERGIES:  Allergies  Allergen Reactions  . Calcium Other (See Comments)    Raises blood pressure     PERTINENT MEDICATIONS:  Outpatient Encounter Medications as of 10/17/2017  Medication Sig  . aspirin 81 MG chewable tablet Chew 81 mg by mouth daily as needed for mild pain.  Marland Kitchen. atenolol (TENORMIN) 25 MG tablet  TAKE 1 TABLET BY MOUTH ONCE DAILY FOR BLOOD PRESSURE  . CARBIDOPA-LEVODOPA EN   . furosemide (LASIX) 20 MG tablet TAKE 1 TAB DAILY, IF NEEDED1 TAB IF WEIGHT GAIN GREATER THAN 2 LBS OR INCREASED LOWER EXREMITY EDEMA  . losartan (COZAAR) 100 MG tablet Take 1 tablet by mouth once daily for high blood pressure.  . potassium chloride (K-DUR) 10 MEQ tablet TAKE 2 TABLETS BY MOUTH ONCE DAILY  . senna (SENOKOT) 8.6 MG TABS tablet Take 1 tablet by mouth 2 (two) times daily.  . traZODone (DESYREL) 50 MG tablet Take 1/2 tablet by mouth at bedtime as needed for sleep.   No facility-administered encounter medications on file as of 10/17/2017.     PHYSICAL EXAM:   General: NAD, frail appearing, thin, sitting in chair Cardiovascular: regular rate and rhythm Pulmonary: clear ant fields Abdomen: soft, nontender, + bowel sounds Extremities: trace edema, legs wrapped Skin: sacral ulcers reported but not visualized Neurological: Weakness, speech clear, able to complete sentences albeit with some confusion, follows simple commands, tremors noted, head slouched to L. side  Malachy MoanJOSHUA R BORDERS, NP

## 2017-10-24 ENCOUNTER — Telehealth: Payer: Self-pay | Admitting: Licensed Clinical Social Worker

## 2017-10-24 NOTE — Telephone Encounter (Signed)
Palliative Care SW phoned patient's daughter, Dewayne Hatchnn, per the request of Elouise MunroeJosh Borders, NP.  Per her request, SW provided education regarding Palliative Care services vs. Hospice services.  She stated her mother has had Parkinsonian symptoms for ten years.  Plan is for SW to follow up with Dewayne HatchAnn next week regarding her plan for patient care.

## 2017-10-31 ENCOUNTER — Other Ambulatory Visit: Payer: Self-pay | Admitting: Primary Care

## 2017-10-31 ENCOUNTER — Other Ambulatory Visit: Payer: Self-pay | Admitting: Family Medicine

## 2017-10-31 DIAGNOSIS — I10 Essential (primary) hypertension: Secondary | ICD-10-CM

## 2017-10-31 DIAGNOSIS — G4700 Insomnia, unspecified: Secondary | ICD-10-CM

## 2017-11-01 NOTE — Telephone Encounter (Signed)
[facility]-Plz see refill req/thx dmf 

## 2017-11-01 NOTE — Telephone Encounter (Signed)
Noted, refill sent to pharmacy. 

## 2017-11-15 DIAGNOSIS — I10 Essential (primary) hypertension: Secondary | ICD-10-CM | POA: Diagnosis not present

## 2017-11-15 DIAGNOSIS — R600 Localized edema: Secondary | ICD-10-CM | POA: Diagnosis not present

## 2017-11-15 DIAGNOSIS — K5900 Constipation, unspecified: Secondary | ICD-10-CM | POA: Diagnosis not present

## 2017-11-20 ENCOUNTER — Ambulatory Visit: Payer: Medicare Other | Admitting: Primary Care

## 2017-12-10 DIAGNOSIS — K5900 Constipation, unspecified: Secondary | ICD-10-CM | POA: Diagnosis not present

## 2017-12-10 DIAGNOSIS — I10 Essential (primary) hypertension: Secondary | ICD-10-CM | POA: Diagnosis not present

## 2017-12-10 DIAGNOSIS — R600 Localized edema: Secondary | ICD-10-CM | POA: Diagnosis not present

## 2018-01-13 DIAGNOSIS — K5900 Constipation, unspecified: Secondary | ICD-10-CM | POA: Diagnosis not present

## 2018-01-13 DIAGNOSIS — R600 Localized edema: Secondary | ICD-10-CM | POA: Diagnosis not present

## 2018-01-13 DIAGNOSIS — I10 Essential (primary) hypertension: Secondary | ICD-10-CM | POA: Diagnosis not present

## 2018-01-31 ENCOUNTER — Other Ambulatory Visit: Payer: Self-pay

## 2018-02-08 ENCOUNTER — Telehealth: Payer: Self-pay | Admitting: Primary Care

## 2018-02-08 DIAGNOSIS — I10 Essential (primary) hypertension: Secondary | ICD-10-CM

## 2018-02-11 DIAGNOSIS — R600 Localized edema: Secondary | ICD-10-CM | POA: Diagnosis not present

## 2018-02-11 DIAGNOSIS — K5900 Constipation, unspecified: Secondary | ICD-10-CM | POA: Diagnosis not present

## 2018-02-11 DIAGNOSIS — I10 Essential (primary) hypertension: Secondary | ICD-10-CM | POA: Diagnosis not present

## 2018-02-11 NOTE — Telephone Encounter (Signed)
Noted, refill sent to pharmacy.  Patient will need an office visit in February 2020 for follow-up of chronic conditions.  Please schedule.

## 2018-02-11 NOTE — Telephone Encounter (Signed)
Last prescribed on 11/01/2017 Last office visit on 05/20/2017

## 2018-02-12 DIAGNOSIS — R05 Cough: Secondary | ICD-10-CM | POA: Diagnosis not present

## 2018-02-12 NOTE — Telephone Encounter (Signed)
I talked to pt's daughter and she states that they have changed providers to someone who does home visits due to it being difficult to get pt out of the house. Will remove [name] as PCP

## 2018-02-12 NOTE — Telephone Encounter (Signed)
Noted  

## 2018-02-20 DIAGNOSIS — I10 Essential (primary) hypertension: Secondary | ICD-10-CM | POA: Diagnosis not present

## 2018-02-20 DIAGNOSIS — R600 Localized edema: Secondary | ICD-10-CM | POA: Diagnosis not present

## 2018-02-20 DIAGNOSIS — K5900 Constipation, unspecified: Secondary | ICD-10-CM | POA: Diagnosis not present

## 2018-02-22 DIAGNOSIS — M1990 Unspecified osteoarthritis, unspecified site: Secondary | ICD-10-CM | POA: Diagnosis not present

## 2018-02-22 DIAGNOSIS — I509 Heart failure, unspecified: Secondary | ICD-10-CM | POA: Diagnosis not present

## 2018-02-22 DIAGNOSIS — F419 Anxiety disorder, unspecified: Secondary | ICD-10-CM | POA: Diagnosis not present

## 2018-02-22 DIAGNOSIS — I110 Hypertensive heart disease with heart failure: Secondary | ICD-10-CM | POA: Diagnosis not present

## 2018-02-22 DIAGNOSIS — G20 Parkinson's disease: Secondary | ICD-10-CM | POA: Diagnosis not present

## 2018-02-22 DIAGNOSIS — F0280 Dementia in other diseases classified elsewhere without behavioral disturbance: Secondary | ICD-10-CM | POA: Diagnosis not present

## 2018-02-22 DIAGNOSIS — I252 Old myocardial infarction: Secondary | ICD-10-CM | POA: Diagnosis not present

## 2018-02-22 DIAGNOSIS — Z96641 Presence of right artificial hip joint: Secondary | ICD-10-CM | POA: Diagnosis not present

## 2018-02-22 DIAGNOSIS — K5900 Constipation, unspecified: Secondary | ICD-10-CM | POA: Diagnosis not present

## 2018-02-25 DIAGNOSIS — F0280 Dementia in other diseases classified elsewhere without behavioral disturbance: Secondary | ICD-10-CM | POA: Diagnosis not present

## 2018-02-25 DIAGNOSIS — I110 Hypertensive heart disease with heart failure: Secondary | ICD-10-CM | POA: Diagnosis not present

## 2018-02-25 DIAGNOSIS — F419 Anxiety disorder, unspecified: Secondary | ICD-10-CM | POA: Diagnosis not present

## 2018-02-25 DIAGNOSIS — I509 Heart failure, unspecified: Secondary | ICD-10-CM | POA: Diagnosis not present

## 2018-02-25 DIAGNOSIS — M1990 Unspecified osteoarthritis, unspecified site: Secondary | ICD-10-CM | POA: Diagnosis not present

## 2018-02-25 DIAGNOSIS — G20 Parkinson's disease: Secondary | ICD-10-CM | POA: Diagnosis not present

## 2018-02-28 DIAGNOSIS — M1990 Unspecified osteoarthritis, unspecified site: Secondary | ICD-10-CM | POA: Diagnosis not present

## 2018-02-28 DIAGNOSIS — F0280 Dementia in other diseases classified elsewhere without behavioral disturbance: Secondary | ICD-10-CM | POA: Diagnosis not present

## 2018-02-28 DIAGNOSIS — G20 Parkinson's disease: Secondary | ICD-10-CM | POA: Diagnosis not present

## 2018-02-28 DIAGNOSIS — I110 Hypertensive heart disease with heart failure: Secondary | ICD-10-CM | POA: Diagnosis not present

## 2018-02-28 DIAGNOSIS — F419 Anxiety disorder, unspecified: Secondary | ICD-10-CM | POA: Diagnosis not present

## 2018-02-28 DIAGNOSIS — I509 Heart failure, unspecified: Secondary | ICD-10-CM | POA: Diagnosis not present

## 2018-03-05 DIAGNOSIS — F419 Anxiety disorder, unspecified: Secondary | ICD-10-CM | POA: Diagnosis not present

## 2018-03-05 DIAGNOSIS — I110 Hypertensive heart disease with heart failure: Secondary | ICD-10-CM | POA: Diagnosis not present

## 2018-03-05 DIAGNOSIS — G20 Parkinson's disease: Secondary | ICD-10-CM | POA: Diagnosis not present

## 2018-03-05 DIAGNOSIS — M1990 Unspecified osteoarthritis, unspecified site: Secondary | ICD-10-CM | POA: Diagnosis not present

## 2018-03-05 DIAGNOSIS — I509 Heart failure, unspecified: Secondary | ICD-10-CM | POA: Diagnosis not present

## 2018-03-05 DIAGNOSIS — F0280 Dementia in other diseases classified elsewhere without behavioral disturbance: Secondary | ICD-10-CM | POA: Diagnosis not present

## 2018-03-14 DIAGNOSIS — I110 Hypertensive heart disease with heart failure: Secondary | ICD-10-CM | POA: Diagnosis not present

## 2018-03-14 DIAGNOSIS — G20 Parkinson's disease: Secondary | ICD-10-CM | POA: Diagnosis not present

## 2018-03-14 DIAGNOSIS — I509 Heart failure, unspecified: Secondary | ICD-10-CM | POA: Diagnosis not present

## 2018-03-14 DIAGNOSIS — M1990 Unspecified osteoarthritis, unspecified site: Secondary | ICD-10-CM | POA: Diagnosis not present

## 2018-03-14 DIAGNOSIS — F419 Anxiety disorder, unspecified: Secondary | ICD-10-CM | POA: Diagnosis not present

## 2018-03-14 DIAGNOSIS — F0280 Dementia in other diseases classified elsewhere without behavioral disturbance: Secondary | ICD-10-CM | POA: Diagnosis not present

## 2018-03-21 DIAGNOSIS — G20 Parkinson's disease: Secondary | ICD-10-CM | POA: Diagnosis not present

## 2018-03-21 DIAGNOSIS — F419 Anxiety disorder, unspecified: Secondary | ICD-10-CM | POA: Diagnosis not present

## 2018-03-21 DIAGNOSIS — I110 Hypertensive heart disease with heart failure: Secondary | ICD-10-CM | POA: Diagnosis not present

## 2018-03-21 DIAGNOSIS — M1990 Unspecified osteoarthritis, unspecified site: Secondary | ICD-10-CM | POA: Diagnosis not present

## 2018-03-21 DIAGNOSIS — I509 Heart failure, unspecified: Secondary | ICD-10-CM | POA: Diagnosis not present

## 2018-03-21 DIAGNOSIS — F0280 Dementia in other diseases classified elsewhere without behavioral disturbance: Secondary | ICD-10-CM | POA: Diagnosis not present

## 2018-03-25 DIAGNOSIS — I509 Heart failure, unspecified: Secondary | ICD-10-CM | POA: Diagnosis not present

## 2018-03-25 DIAGNOSIS — G20 Parkinson's disease: Secondary | ICD-10-CM | POA: Diagnosis not present

## 2018-03-25 DIAGNOSIS — F0280 Dementia in other diseases classified elsewhere without behavioral disturbance: Secondary | ICD-10-CM | POA: Diagnosis not present

## 2018-03-25 DIAGNOSIS — I110 Hypertensive heart disease with heart failure: Secondary | ICD-10-CM | POA: Diagnosis not present

## 2018-03-25 DIAGNOSIS — F419 Anxiety disorder, unspecified: Secondary | ICD-10-CM | POA: Diagnosis not present

## 2018-03-25 DIAGNOSIS — M1990 Unspecified osteoarthritis, unspecified site: Secondary | ICD-10-CM | POA: Diagnosis not present

## 2018-03-27 DIAGNOSIS — R600 Localized edema: Secondary | ICD-10-CM | POA: Diagnosis not present

## 2018-03-27 DIAGNOSIS — I10 Essential (primary) hypertension: Secondary | ICD-10-CM | POA: Diagnosis not present

## 2018-03-27 DIAGNOSIS — K5900 Constipation, unspecified: Secondary | ICD-10-CM | POA: Diagnosis not present

## 2018-04-01 DIAGNOSIS — M1990 Unspecified osteoarthritis, unspecified site: Secondary | ICD-10-CM | POA: Diagnosis not present

## 2018-04-01 DIAGNOSIS — I509 Heart failure, unspecified: Secondary | ICD-10-CM | POA: Diagnosis not present

## 2018-04-01 DIAGNOSIS — F0280 Dementia in other diseases classified elsewhere without behavioral disturbance: Secondary | ICD-10-CM | POA: Diagnosis not present

## 2018-04-01 DIAGNOSIS — I110 Hypertensive heart disease with heart failure: Secondary | ICD-10-CM | POA: Diagnosis not present

## 2018-04-01 DIAGNOSIS — F419 Anxiety disorder, unspecified: Secondary | ICD-10-CM | POA: Diagnosis not present

## 2018-04-01 DIAGNOSIS — G20 Parkinson's disease: Secondary | ICD-10-CM | POA: Diagnosis not present

## 2018-04-01 DIAGNOSIS — I10 Essential (primary) hypertension: Secondary | ICD-10-CM | POA: Diagnosis not present

## 2018-04-22 DIAGNOSIS — R404 Transient alteration of awareness: Secondary | ICD-10-CM | POA: Diagnosis not present

## 2018-04-22 DIAGNOSIS — R5381 Other malaise: Secondary | ICD-10-CM | POA: Diagnosis not present

## 2018-05-20 DIAGNOSIS — R600 Localized edema: Secondary | ICD-10-CM | POA: Diagnosis not present

## 2018-05-20 DIAGNOSIS — I509 Heart failure, unspecified: Secondary | ICD-10-CM | POA: Diagnosis not present

## 2018-05-20 DIAGNOSIS — K5900 Constipation, unspecified: Secondary | ICD-10-CM | POA: Diagnosis not present

## 2018-07-04 ENCOUNTER — Encounter (HOSPITAL_COMMUNITY): Payer: Self-pay

## 2018-07-04 ENCOUNTER — Ambulatory Visit (HOSPITAL_COMMUNITY)
Admission: EM | Admit: 2018-07-04 | Discharge: 2018-07-04 | Disposition: A | Discharge status: Home / Self Care | Payer: Medicare Other | Attending: Family Medicine | Admitting: Family Medicine

## 2018-07-04 ENCOUNTER — Other Ambulatory Visit: Payer: Self-pay

## 2018-07-04 DIAGNOSIS — W228XXA Striking against or struck by other objects, initial encounter: Secondary | ICD-10-CM | POA: Diagnosis not present

## 2018-07-04 DIAGNOSIS — S81812A Laceration without foreign body, left lower leg, initial encounter: Secondary | ICD-10-CM

## 2018-07-04 DIAGNOSIS — Z23 Encounter for immunization: Secondary | ICD-10-CM

## 2018-07-04 DIAGNOSIS — I10 Essential (primary) hypertension: Secondary | ICD-10-CM

## 2018-07-04 MED ORDER — TETANUS-DIPHTH-ACELL PERTUSSIS 5-2.5-18.5 LF-MCG/0.5 IM SUSP
INTRAMUSCULAR | Status: AC
  Filled 2018-07-04: qty 0.5

## 2018-07-04 MED ORDER — TETANUS-DIPHTH-ACELL PERTUSSIS 5-2.5-18.5 LF-MCG/0.5 IM SUSP
0.5000 mL | Freq: Once | INTRAMUSCULAR | Status: AC
  Administered 2018-07-04: 20:00:00 0.5 mL via INTRAMUSCULAR

## 2018-07-04 MED ORDER — LIDOCAINE HCL (PF) 1 % IJ SOLN
INTRAMUSCULAR | Status: AC
  Filled 2018-07-04: qty 30

## 2018-07-04 NOTE — Discharge Instructions (Addendum)
Change dressing daily Apply thin coat of antibiotic ointment and use non stick dressing Elevate leg to reduce any swelling Watch for infection - redness, pus, pain, swelling Stitches out in 10 days

## 2018-07-04 NOTE — ED Notes (Signed)
Patient's caregiver/daughter verbalizes understanding of discharge instructions. Opportunity for questioning and answers were provided. Patient discharged from [facility] by RN with daughter via wheelchair.

## 2018-07-04 NOTE — ED Triage Notes (Signed)
Patient presents to Urgent Care with complaints of left leg lac while being assisted to the bathroom while sitting on her rollator with her sitter since this afternoon. Patient is not on blood thinners, lac extends to muscle through the subcutaneous fat on the lateral aspect of her lower leg, bleeding not controlled upon arrival.

## 2018-07-04 NOTE — ED Provider Notes (Signed)
[facility]    CSN: [id number] Arrival date & time: 07/04/18  1845     History   Chief Complaint Chief Complaint  Patient presents with  . Extremity Laceration    HPI Mallory Perez is a 85 y.o. female.   HPI   "Mallory Perez" is a lovely 85 year old woman with end-stage dementia.  She is cared for at home.  She is in a wheelchair.  She is largely uncommunicative with single word answers.  She has very fragile skin and has suffered multiple skin tears.  She is also being treated for a sacral decubitus ulcer at this time.  Today while being transported from 1 room to another her lateral left leg caught on furniture and tore.  She is here today for a laceration repair.  Family wrapped it with a pressure dressing to try to stop to the bleeding.   Past Medical History:  Diagnosis Date  . Depression    s/p ECT therapy  . Fall at home   . Hypertension   . Myocardial infarction (HCC) 1987  . Parkinson's disease     Patient Active Problem List   Diagnosis Date Noted  . Hallucinations 05/03/2017  . Irregular cardiac rhythm 07/25/2016  . Need for home health care 12/08/2015  . Weakness 12/08/2015  . Osteoporosis 09/27/2014  . Fracture of femoral neck, right (HCC) 04/19/2012  . Leg edema 01/03/2012  . Poor circulation of extremity 08/01/2011  . HEARING LOSS, BILATERAL 04/11/2010  . UNSPECIFIED ANEMIA 02/23/2010  . DEPRESSION 10/01/2008  . PARKINSON'S DISEASE 10/01/2008  . Essential hypertension 10/01/2008  . MYOCARDIAL INFARCTION, HX OF 10/01/2008  . INSOMNIA 08/05/2007  . HYPOKALEMIA 04/03/2007    Past Surgical History:  Procedure Laterality Date  . ABDOMINAL HYSTERECTOMY  1995  . BACK SURGERY  2003 & 01/1989  . BLADDER SURGERY  09/1988  . ELECTROCONVULSIVE THERAPY    . HIP ARTHROPLASTY  04/19/2012   Procedure: ARTHROPLASTY BIPOLAR HIP;  Surgeon: Dannielle Huh, MD;  Location: [facility] OR;  Service: Orthopedics;  Laterality: Right;    OB History   No obstetric  history on file.      Home Medications    Prior to Admission medications   Medication Sig Start Date End Date Taking? Authorizing Provider  aspirin 81 MG chewable tablet Chew 81 mg by mouth daily as needed for mild pain.    [provider]  atenolol (TENORMIN) 25 MG tablet TAKE 1 TABLET BY MOUTH ONCE DAILY FOR BLOOD PRESSURE 06/24/17   Doreene Nest, NP  CARBIDOPA-LEVODOPA EN  05/14/17   [provider]  furosemide (LASIX) 20 MG tablet TAKE 1 TAB DAILY, IF NEEDED1 TAB IF WEIGHT GAIN GREATER THAN 2 LBS OR INCREASED LOWER EXREMITY EDEMA 07/26/16   Dianne Dun, MD  losartan (COZAAR) 100 MG tablet Take 1 tablet by mouth once daily for high blood pressure. 05/20/17   Doreene Nest, NP  potassium chloride (K-DUR) 10 MEQ tablet TAKE 2 TABLETS BY MOUTH ONCE DAILY 02/11/18   Doreene Nest, NP  senna (SENOKOT) 8.6 MG TABS tablet Take 1 tablet by mouth 2 (two) times daily.    [provider]  traZODone (DESYREL) 50 MG tablet Take 0.5 tablets (25 mg total) by mouth at bedtime as needed for sleep. 11/01/17   Doreene Nest, NP    Family History Family History  Problem Relation Age of Onset  . Angina Mother        and MI  .  Alcohol abuse Other        brain tumor  . Diabetes Other     Social History Social History   Tobacco Use  . Smoking status: Never Smoker  . Smokeless tobacco: Never Used  Substance Use Topics  . Alcohol use: No    Alcohol/week: 0.0 standard drinks  . Drug use: No     Allergies   Calcium   Review of Systems Review of Systems  Unable to perform ROS: Dementia  Constitutional: Negative for appetite change.  Respiratory: Negative for cough and shortness of breath.   Cardiovascular: Positive for leg swelling.  Gastrointestinal: Negative for constipation and diarrhea.  Genitourinary: Negative for difficulty urinating.  Skin: Positive for wound. Negative for rash.  Psychiatric/Behavioral: Positive for confusion.   Review  of systems is from daughter Physical Exam Triage Vital Signs ED Triage Vitals [07/04/18 1908]  Enc Vitals Group     BP (!) 165/98     Pulse Rate (!) 110     Resp 19     Temp 98.5 F (36.9 C)     Temp Source Oral     SpO2 100 %   No data found.  Updated Vital Signs BP (!) 165/98 (BP Location: Left Arm)   Pulse (!) 110   Temp 98.5 F (36.9 C) (Oral)   Resp 19   SpO2 100%       Physical Exam Constitutional:      Comments: Makes eye contact.  Does not answer question.  Normal pain response.  Cardiovascular:     Rate and Rhythm: Normal rate and regular rhythm.     Heart sounds: Normal heart sounds.  Pulmonary:     Effort: Pulmonary effort is normal.     Breath sounds: Normal breath sounds.  Musculoskeletal:     Comments: Chair bound  Skin:    Comments: Large wound on left lower leg.  Measures 18 cm.  Full-thickness to muscle.  Neurological:     Mental Status: She is alert.      UC Treatments / Results  Labs (all labs ordered are listed, but only abnormal results are displayed) Labs Reviewed - No data to display  EKG None  Radiology No results found.  Procedures Laceration Repair Date/Time: 07/04/2018 7:59 PM Performed by: Eustace Moore, MD Authorized by: Eustace Moore, MD   Consent:    Consent obtained:  Verbal   Consent given by:  Guardian   Risks discussed:  Poor cosmetic result, poor wound healing and infection Anesthesia (see MAR for exact dosages):    Anesthesia method:  Local infiltration   Local anesthetic:  Lidocaine 1% w/o epi Laceration details:    Location:  Leg   Leg location:  L lower leg   Length (cm):  18   Depth (mm):  25 Repair type:    Repair type:  Intermediate Pre-procedure details:    Preparation:  Patient was prepped and draped in usual sterile fashion Exploration:    Hemostasis achieved with:  Direct pressure   Wound exploration: entire depth of wound probed and visualized     Wound extent: no foreign  bodies/material noted, no muscle damage noted, no nerve damage noted, no tendon damage noted and no vascular damage noted     Contaminated: no   Treatment:    Area cleansed with:  Betadine   Amount of cleaning:  Extensive   Irrigation solution:  Sterile saline   Irrigation volume:  40   Irrigation method:  Syringe  Visualized foreign bodies/material removed: no   Subcutaneous repair:    Suture size:  4-0   Suture material:  Vicryl   Suture technique:  Simple interrupted   Number of sutures:  4 Skin repair:    Repair method:  Sutures   Suture size:  4-0   Suture material:  Prolene   Suture technique:  Running locked   Number of sutures:  22 Approximation:    Approximation:  Close Post-procedure details:    Dressing:  Antibiotic ointment and non-adherent dressing   Patient tolerance of procedure:  Tolerated well, no immediate complications   (including critical care time)  Medications Ordered in UC Medications  Tdap (BOOSTRIX) injection 0.5 mL (has no administration in time range)    Initial Impression / Assessment and Plan / UC Course  I have reviewed the triage vital signs and the nursing notes.  Pertinent labs & imaging results that were available during my care of the patient were reviewed by me and considered in my medical decision making (see chart for details).     Wound care discussed Sign of infection discussed Suture removal discussed  Final Clinical Impressions(s) / UC Diagnoses   Final diagnoses:  Laceration of skin of left lower leg, initial encounter     Discharge Instructions     Change dressing daily Apply thin coat of antibiotic ointment and use non stick dressing Elevate leg to reduce any swelling Watch for infection - redness, pus, pain, swelling Stitches out in 10 days   ED Prescriptions    None     Controlled Substance Prescriptions [place] Controlled Substance Registry consulted? Not Applicable   Eustace MooreNelson, Emylie Amster Sue, MD 07/04/18 2002

## 2018-07-10 DIAGNOSIS — R600 Localized edema: Secondary | ICD-10-CM | POA: Diagnosis not present

## 2018-07-10 DIAGNOSIS — L8990 Pressure ulcer of unspecified site, unspecified stage: Secondary | ICD-10-CM | POA: Diagnosis not present

## 2018-07-10 DIAGNOSIS — I509 Heart failure, unspecified: Secondary | ICD-10-CM | POA: Diagnosis not present

## 2018-07-14 DIAGNOSIS — K5900 Constipation, unspecified: Secondary | ICD-10-CM | POA: Diagnosis not present

## 2018-07-14 DIAGNOSIS — M1990 Unspecified osteoarthritis, unspecified site: Secondary | ICD-10-CM | POA: Diagnosis not present

## 2018-07-14 DIAGNOSIS — G3183 Dementia with Lewy bodies: Secondary | ICD-10-CM | POA: Diagnosis not present

## 2018-07-14 DIAGNOSIS — I110 Hypertensive heart disease with heart failure: Secondary | ICD-10-CM | POA: Diagnosis not present

## 2018-07-14 DIAGNOSIS — S81812D Laceration without foreign body, left lower leg, subsequent encounter: Secondary | ICD-10-CM | POA: Diagnosis not present

## 2018-07-14 DIAGNOSIS — F0280 Dementia in other diseases classified elsewhere without behavioral disturbance: Secondary | ICD-10-CM | POA: Diagnosis not present

## 2018-07-14 DIAGNOSIS — F419 Anxiety disorder, unspecified: Secondary | ICD-10-CM | POA: Diagnosis not present

## 2018-07-14 DIAGNOSIS — F329 Major depressive disorder, single episode, unspecified: Secondary | ICD-10-CM | POA: Diagnosis not present

## 2018-07-14 DIAGNOSIS — Z96641 Presence of right artificial hip joint: Secondary | ICD-10-CM | POA: Diagnosis not present

## 2018-07-14 DIAGNOSIS — I509 Heart failure, unspecified: Secondary | ICD-10-CM | POA: Diagnosis not present

## 2018-07-14 DIAGNOSIS — L89150 Pressure ulcer of sacral region, unstageable: Secondary | ICD-10-CM | POA: Diagnosis not present

## 2018-07-16 DIAGNOSIS — G3183 Dementia with Lewy bodies: Secondary | ICD-10-CM | POA: Diagnosis not present

## 2018-07-16 DIAGNOSIS — L89150 Pressure ulcer of sacral region, unstageable: Secondary | ICD-10-CM | POA: Diagnosis not present

## 2018-07-16 DIAGNOSIS — S81812D Laceration without foreign body, left lower leg, subsequent encounter: Secondary | ICD-10-CM | POA: Diagnosis not present

## 2018-07-16 DIAGNOSIS — I110 Hypertensive heart disease with heart failure: Secondary | ICD-10-CM | POA: Diagnosis not present

## 2018-07-16 DIAGNOSIS — I509 Heart failure, unspecified: Secondary | ICD-10-CM | POA: Diagnosis not present

## 2018-07-16 DIAGNOSIS — F0280 Dementia in other diseases classified elsewhere without behavioral disturbance: Secondary | ICD-10-CM | POA: Diagnosis not present

## 2018-07-18 DIAGNOSIS — S81812D Laceration without foreign body, left lower leg, subsequent encounter: Secondary | ICD-10-CM | POA: Diagnosis not present

## 2018-07-18 DIAGNOSIS — G3183 Dementia with Lewy bodies: Secondary | ICD-10-CM | POA: Diagnosis not present

## 2018-07-18 DIAGNOSIS — F0280 Dementia in other diseases classified elsewhere without behavioral disturbance: Secondary | ICD-10-CM | POA: Diagnosis not present

## 2018-07-18 DIAGNOSIS — I509 Heart failure, unspecified: Secondary | ICD-10-CM | POA: Diagnosis not present

## 2018-07-18 DIAGNOSIS — L89150 Pressure ulcer of sacral region, unstageable: Secondary | ICD-10-CM | POA: Diagnosis not present

## 2018-07-18 DIAGNOSIS — I110 Hypertensive heart disease with heart failure: Secondary | ICD-10-CM | POA: Diagnosis not present

## 2018-07-21 DIAGNOSIS — F0280 Dementia in other diseases classified elsewhere without behavioral disturbance: Secondary | ICD-10-CM | POA: Diagnosis not present

## 2018-07-21 DIAGNOSIS — L8990 Pressure ulcer of unspecified site, unspecified stage: Secondary | ICD-10-CM | POA: Diagnosis not present

## 2018-07-21 DIAGNOSIS — R600 Localized edema: Secondary | ICD-10-CM | POA: Diagnosis not present

## 2018-07-21 DIAGNOSIS — I110 Hypertensive heart disease with heart failure: Secondary | ICD-10-CM | POA: Diagnosis not present

## 2018-07-21 DIAGNOSIS — L89150 Pressure ulcer of sacral region, unstageable: Secondary | ICD-10-CM | POA: Diagnosis not present

## 2018-07-21 DIAGNOSIS — S81812D Laceration without foreign body, left lower leg, subsequent encounter: Secondary | ICD-10-CM | POA: Diagnosis not present

## 2018-07-21 DIAGNOSIS — I509 Heart failure, unspecified: Secondary | ICD-10-CM | POA: Diagnosis not present

## 2018-07-21 DIAGNOSIS — G3183 Dementia with Lewy bodies: Secondary | ICD-10-CM | POA: Diagnosis not present

## 2018-07-22 DIAGNOSIS — M21371 Foot drop, right foot: Secondary | ICD-10-CM | POA: Diagnosis not present

## 2018-07-22 DIAGNOSIS — M24542 Contracture, left hand: Secondary | ICD-10-CM | POA: Diagnosis not present

## 2018-07-22 DIAGNOSIS — R251 Tremor, unspecified: Secondary | ICD-10-CM | POA: Diagnosis not present

## 2018-07-22 DIAGNOSIS — L89150 Pressure ulcer of sacral region, unstageable: Secondary | ICD-10-CM | POA: Diagnosis not present

## 2018-07-22 DIAGNOSIS — L89611 Pressure ulcer of right heel, stage 1: Secondary | ICD-10-CM | POA: Diagnosis not present

## 2018-07-22 DIAGNOSIS — F0150 Vascular dementia without behavioral disturbance: Secondary | ICD-10-CM | POA: Diagnosis not present

## 2018-07-22 DIAGNOSIS — M21372 Foot drop, left foot: Secondary | ICD-10-CM | POA: Diagnosis not present

## 2018-07-22 DIAGNOSIS — Z8781 Personal history of (healed) traumatic fracture: Secondary | ICD-10-CM | POA: Diagnosis not present

## 2018-07-22 DIAGNOSIS — D649 Anemia, unspecified: Secondary | ICD-10-CM | POA: Diagnosis not present

## 2018-07-22 DIAGNOSIS — I252 Old myocardial infarction: Secondary | ICD-10-CM | POA: Diagnosis not present

## 2018-07-22 DIAGNOSIS — I110 Hypertensive heart disease with heart failure: Secondary | ICD-10-CM | POA: Diagnosis not present

## 2018-07-22 DIAGNOSIS — L89621 Pressure ulcer of left heel, stage 1: Secondary | ICD-10-CM | POA: Diagnosis not present

## 2018-07-22 DIAGNOSIS — M6248 Contracture of muscle, other site: Secondary | ICD-10-CM | POA: Diagnosis not present

## 2018-07-22 DIAGNOSIS — I509 Heart failure, unspecified: Secondary | ICD-10-CM | POA: Diagnosis not present

## 2018-07-22 DIAGNOSIS — I739 Peripheral vascular disease, unspecified: Secondary | ICD-10-CM | POA: Diagnosis not present

## 2018-07-22 DIAGNOSIS — I2510 Atherosclerotic heart disease of native coronary artery without angina pectoris: Secondary | ICD-10-CM | POA: Diagnosis not present

## 2018-07-22 DIAGNOSIS — M1990 Unspecified osteoarthritis, unspecified site: Secondary | ICD-10-CM | POA: Diagnosis not present

## 2018-07-24 DIAGNOSIS — I2510 Atherosclerotic heart disease of native coronary artery without angina pectoris: Secondary | ICD-10-CM | POA: Diagnosis not present

## 2018-07-24 DIAGNOSIS — I739 Peripheral vascular disease, unspecified: Secondary | ICD-10-CM | POA: Diagnosis not present

## 2018-07-24 DIAGNOSIS — I252 Old myocardial infarction: Secondary | ICD-10-CM | POA: Diagnosis not present

## 2018-07-24 DIAGNOSIS — F0150 Vascular dementia without behavioral disturbance: Secondary | ICD-10-CM | POA: Diagnosis not present

## 2018-07-24 DIAGNOSIS — I509 Heart failure, unspecified: Secondary | ICD-10-CM | POA: Diagnosis not present

## 2018-07-24 DIAGNOSIS — I110 Hypertensive heart disease with heart failure: Secondary | ICD-10-CM | POA: Diagnosis not present

## 2018-08-18 DEATH — deceased

## 2018-12-03 IMAGING — DX DG CHEST 1V PORT
1 series · 1 of 1 positions shown · non-contrast
Comparison: 07/25/2016

CLINICAL DATA: Syncope.

EXAM:
PORTABLE CHEST 1 VIEW

[chest ap]
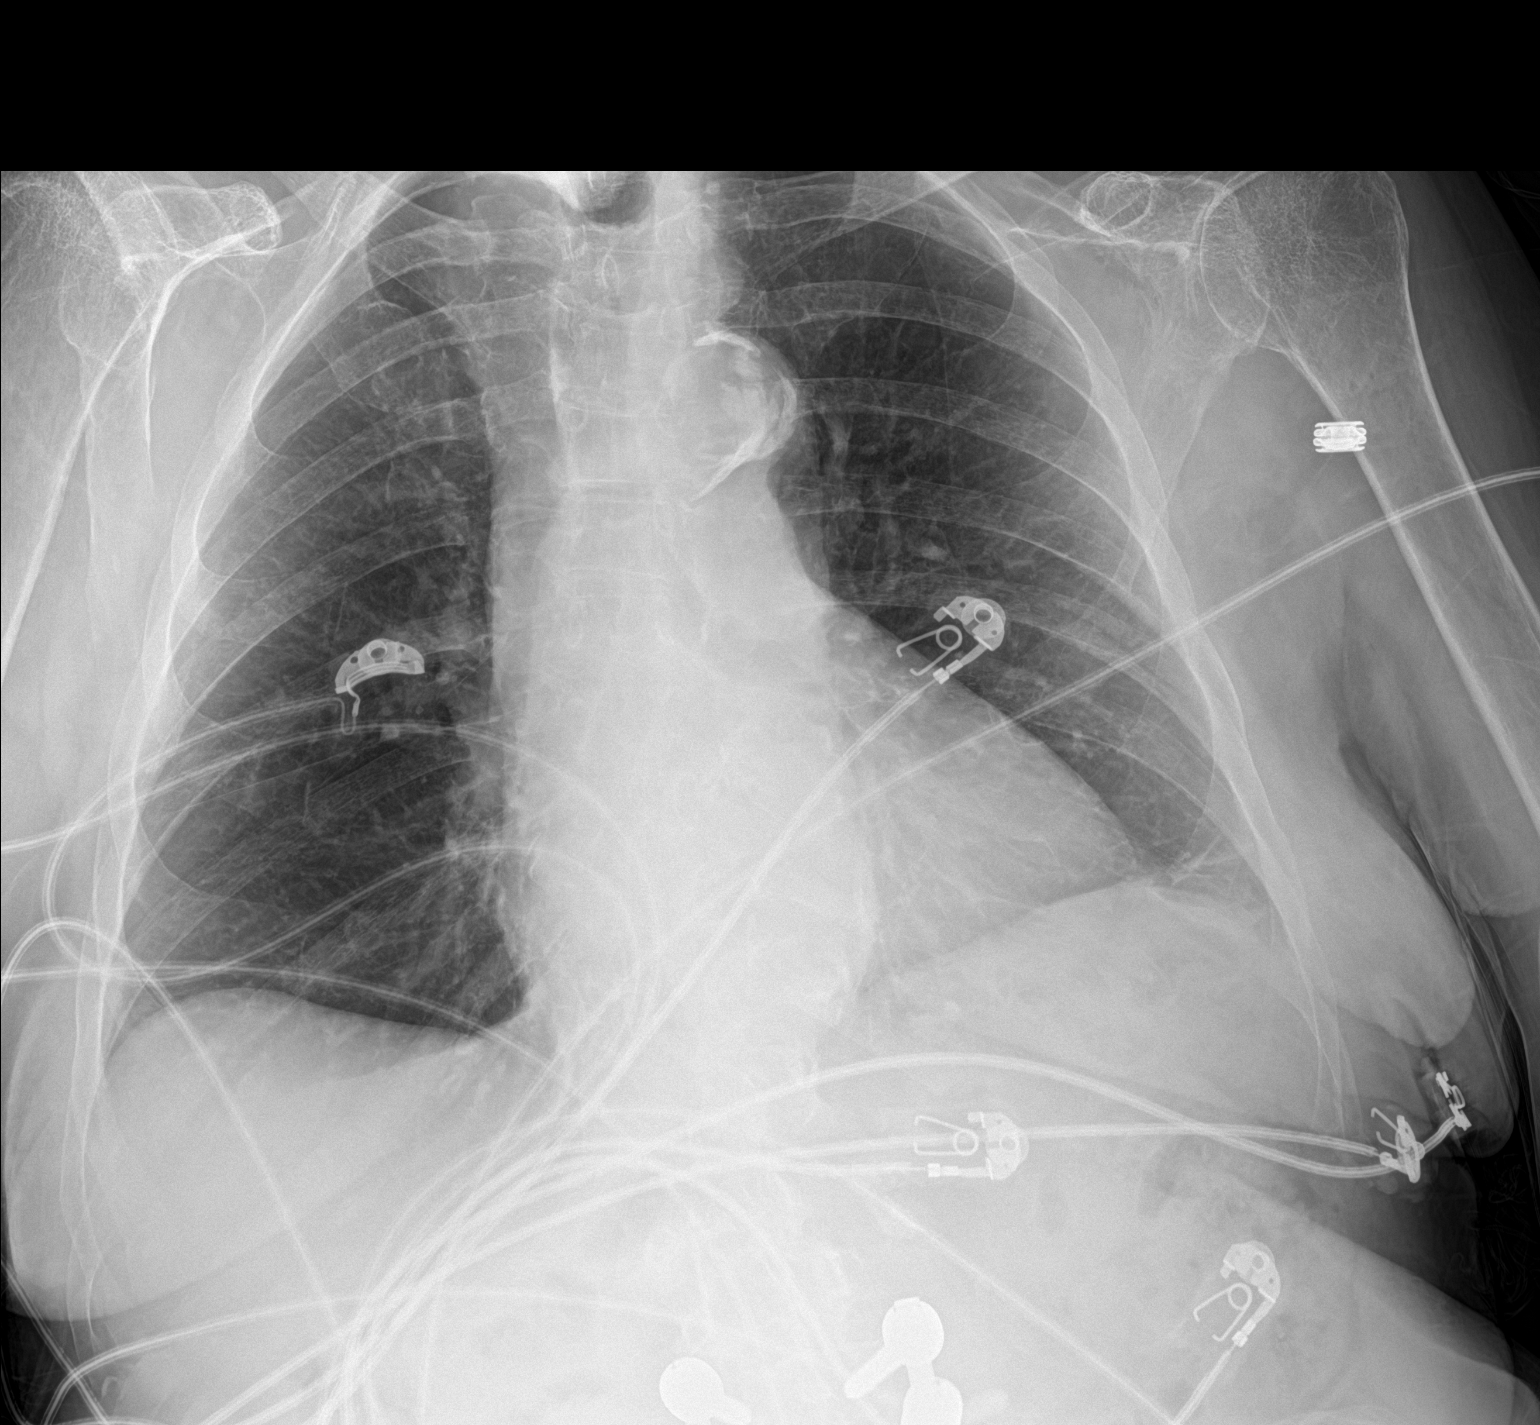

[1 of 1 positions shown; findings below may reference images not displayed]

FINDINGS: Normal heart size. Aortic atherosclerosis noted. No pleural effusion
or edema identified. No airspace opacities.
IMPRESSION: 1. No acute cardiopulmonary abnormalities.
2.  Aortic Atherosclerosis ([id number]).
